# Patient Record
Sex: Female | Born: 1955 | Race: Black or African American | Hispanic: No | Smoking: Never smoker
Health system: Southern US, Community
[De-identification: ages and names within clinical notes are randomized; demographics above are authoritative.]

## PROBLEM LIST (undated history)

## (undated) HISTORY — PX: KNEE SURGERY: SHX244

---

## 2000-06-08 ENCOUNTER — Encounter: Payer: Self-pay | Admitting: Emergency Medicine

## 2000-06-08 ENCOUNTER — Emergency Department (HOSPITAL_COMMUNITY): Admission: EM | Admit: 2000-06-08 | Discharge: 2000-06-08 | Payer: Self-pay | Admitting: Emergency Medicine

## 2004-10-22 ENCOUNTER — Other Ambulatory Visit: Admission: RE | Admit: 2004-10-22 | Discharge: 2004-10-22 | Payer: Self-pay | Admitting: Obstetrics and Gynecology

## 2004-11-22 ENCOUNTER — Ambulatory Visit (HOSPITAL_COMMUNITY): Admission: RE | Admit: 2004-11-22 | Discharge: 2004-11-22 | Payer: Self-pay | Admitting: *Deleted

## 2006-02-19 ENCOUNTER — Ambulatory Visit (HOSPITAL_COMMUNITY): Admission: RE | Admit: 2006-02-19 | Discharge: 2006-02-19 | Payer: Self-pay | Admitting: Obstetrics & Gynecology

## 2006-11-03 ENCOUNTER — Emergency Department (HOSPITAL_COMMUNITY): Admission: EM | Admit: 2006-11-03 | Discharge: 2006-11-03 | Payer: Self-pay | Admitting: *Deleted

## 2007-02-27 ENCOUNTER — Ambulatory Visit (HOSPITAL_COMMUNITY): Admission: RE | Admit: 2007-02-27 | Discharge: 2007-02-27 | Payer: Self-pay | Admitting: Obstetrics & Gynecology

## 2008-04-13 IMAGING — CR DG HAND COMPLETE 3+V*R*
3 series · 3 of 3 positions shown · non-contrast
Comparison: None available.

CLINICAL DATA: Fell-right hand pain.  
 RIGHT HAND ? 3 VIEW:

[x hand pa right]
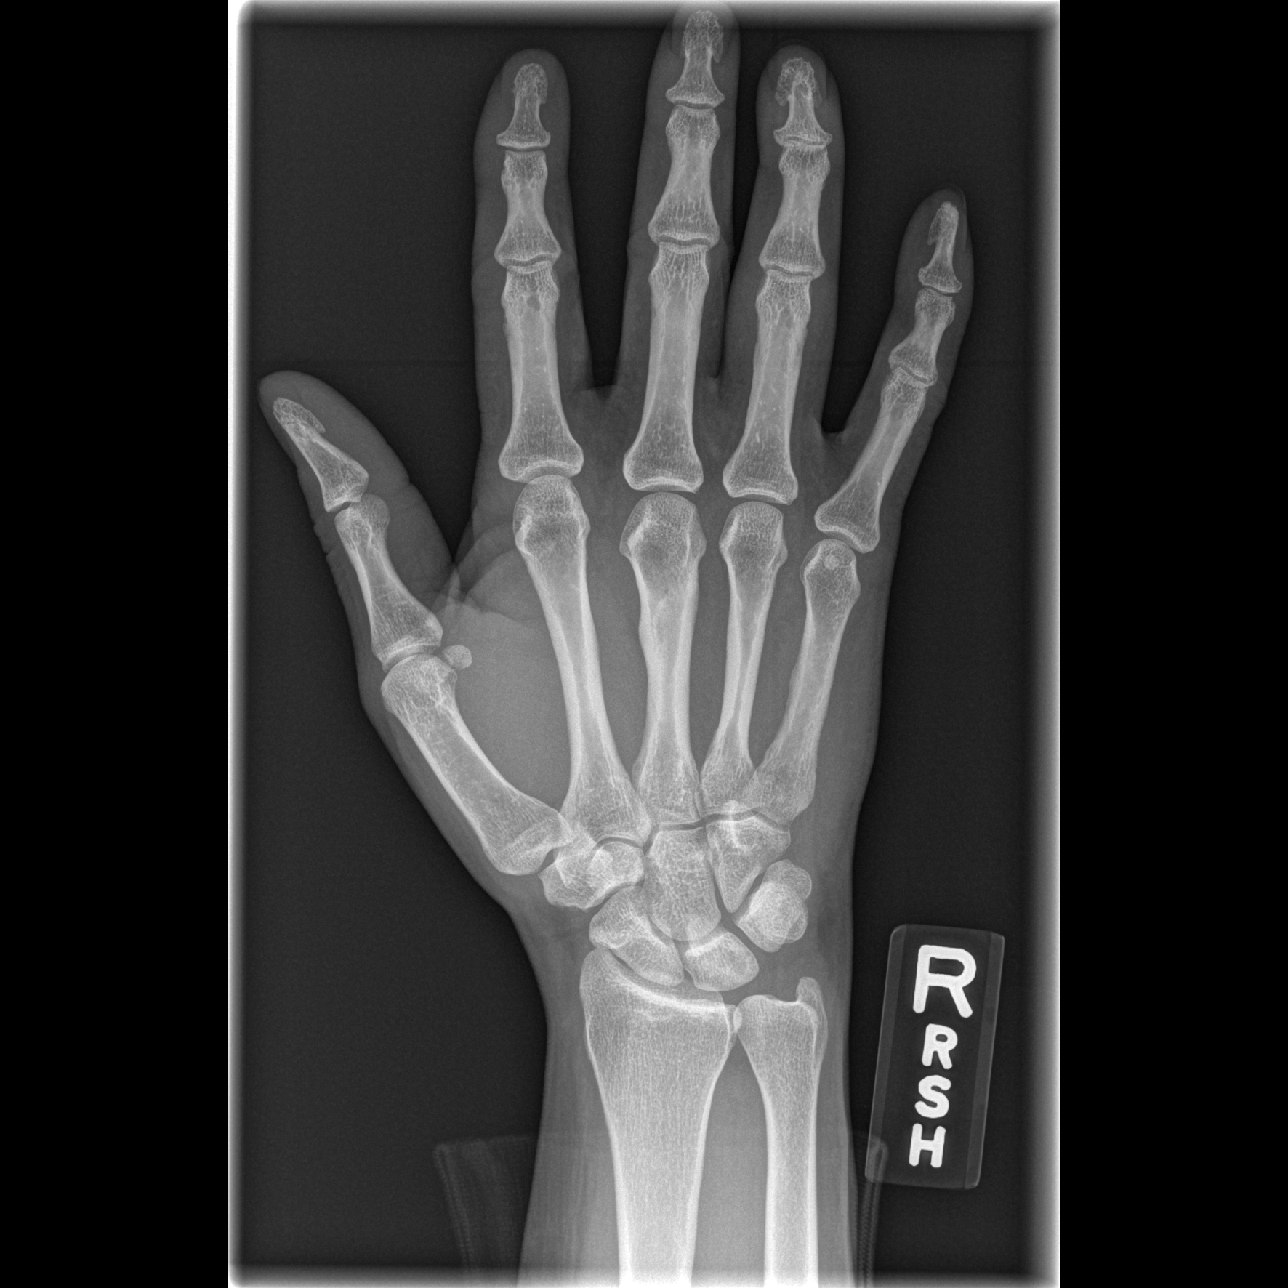

[x hand oblique right]
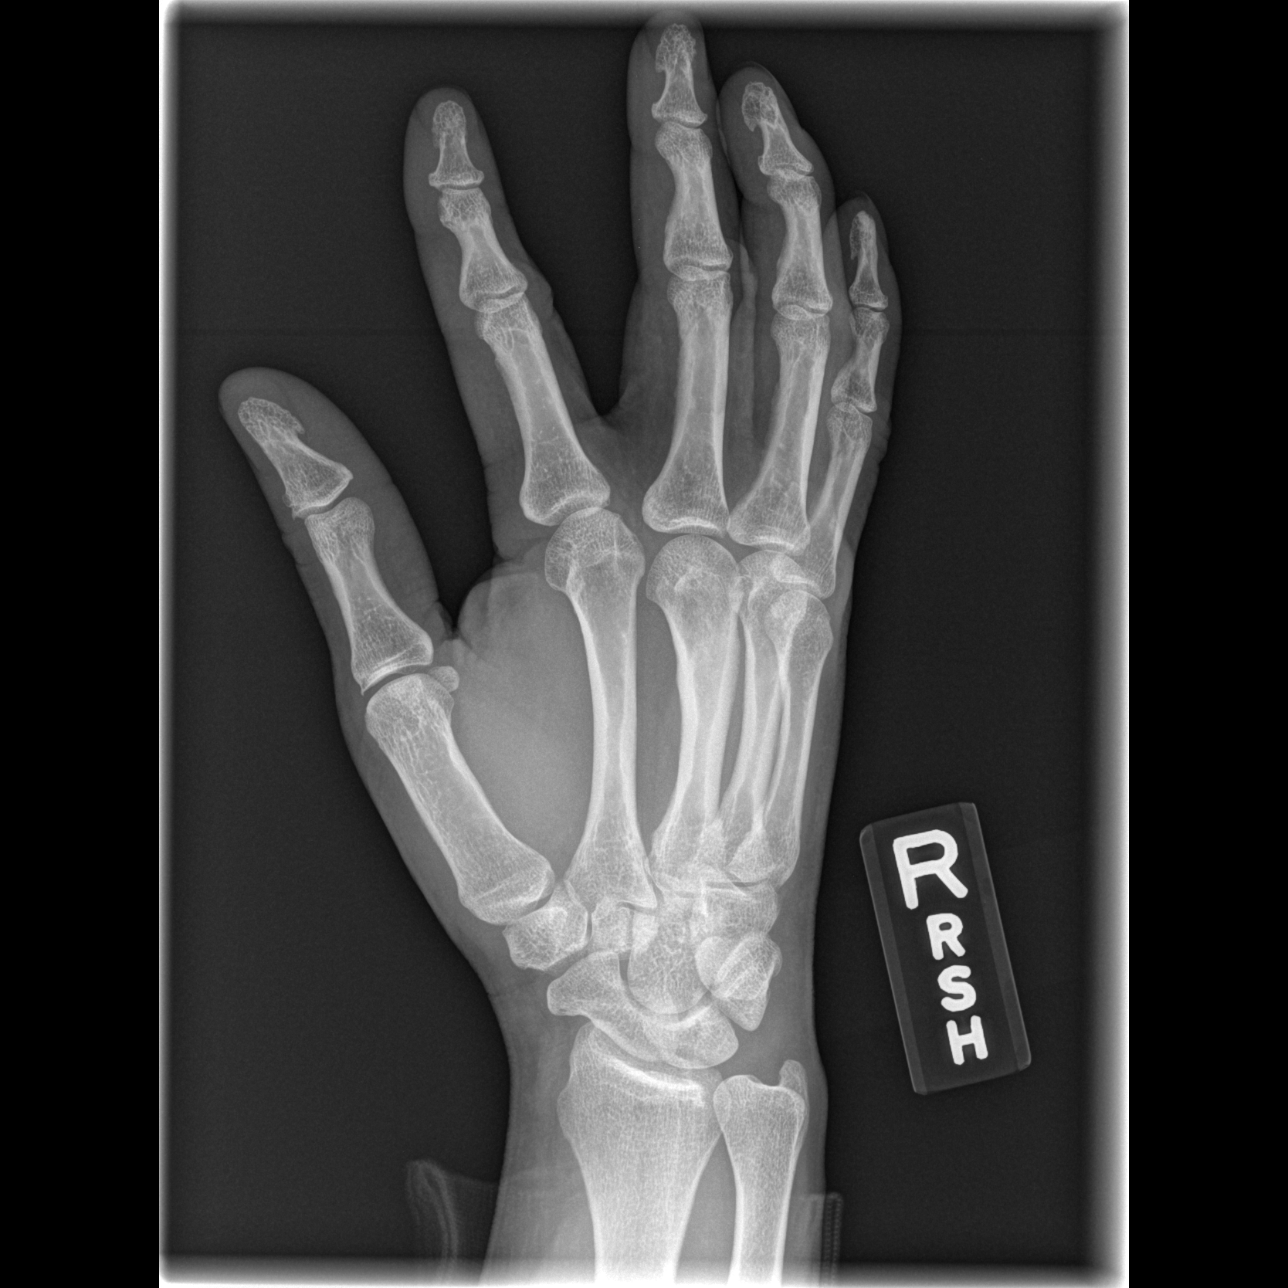

[x hand lat right]
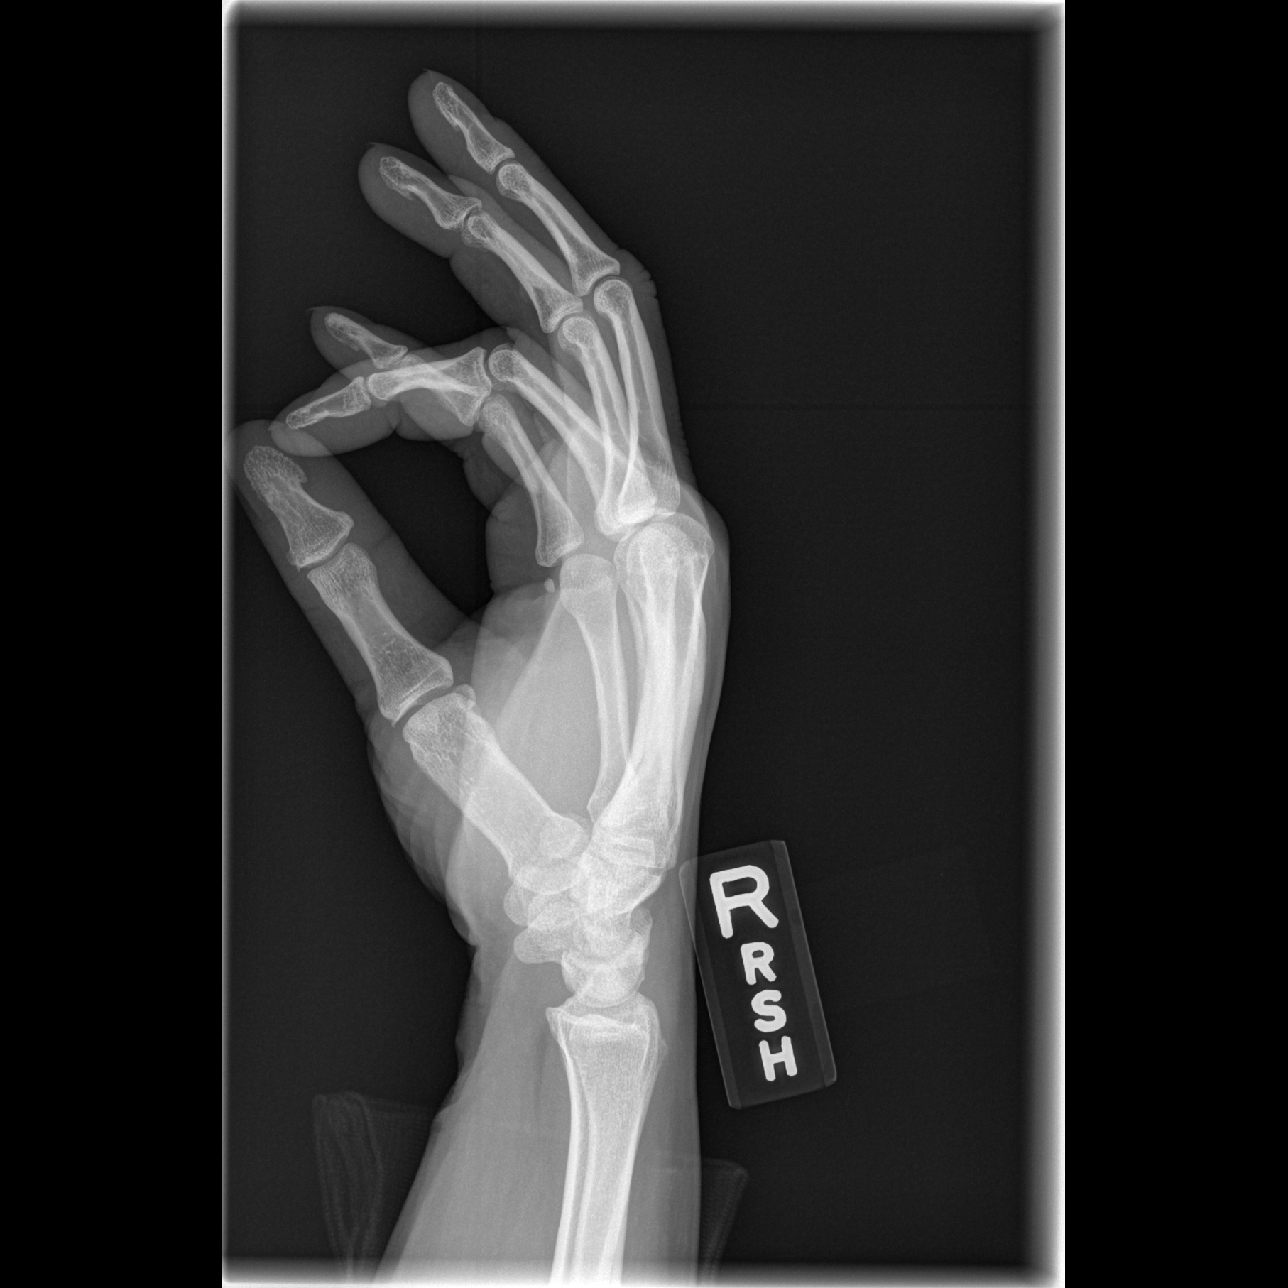

[3 of 3 positions shown; findings below may reference images not displayed]

FINDINGS: There is no evidence of fracture or dislocation.  There is no evidence of arthropathy or other focal bone abnormality.  Soft tissues are unremarkable.
IMPRESSION: Negative.

## 2010-04-27 ENCOUNTER — Other Ambulatory Visit: Admission: RE | Admit: 2010-04-27 | Discharge: 2010-04-27 | Payer: Self-pay | Admitting: Internal Medicine

## 2010-04-27 ENCOUNTER — Ambulatory Visit: Payer: Self-pay | Admitting: Internal Medicine

## 2010-04-27 LAB — CONVERTED CEMR LAB
Albumin: 3.7 g/dL (ref 3.5–5.2)
Basophils Relative: 0.8 % (ref 0.0–3.0)
CO2: 29 meq/L (ref 19–32)
Chloride: 104 meq/L (ref 96–112)
Creatinine, Ser: 0.7 mg/dL (ref 0.4–1.2)
Eosinophils Absolute: 0.2 10*3/uL (ref 0.0–0.7)
HCT: 35.4 % — ABNORMAL LOW (ref 36.0–46.0)
Hemoglobin: 12.3 g/dL (ref 12.0–15.0)
Lymphs Abs: 2.4 10*3/uL (ref 0.7–4.0)
MCHC: 34.9 g/dL (ref 30.0–36.0)
MCV: 85.3 fL (ref 78.0–100.0)
Monocytes Absolute: 0.5 10*3/uL (ref 0.1–1.0)
Neutro Abs: 2.5 10*3/uL (ref 1.4–7.7)
Pap Smear: NEGATIVE
RBC: 4.15 M/uL (ref 3.87–5.11)
Total CHOL/HDL Ratio: 4
Total Protein: 7.5 g/dL (ref 6.0–8.3)
Triglycerides: 66 mg/dL (ref 0.0–149.0)

## 2010-05-03 ENCOUNTER — Encounter: Payer: Self-pay | Admitting: Internal Medicine

## 2010-05-29 ENCOUNTER — Encounter: Payer: Self-pay | Admitting: Internal Medicine

## 2010-05-31 ENCOUNTER — Telehealth: Payer: Self-pay | Admitting: Internal Medicine

## 2010-07-03 ENCOUNTER — Encounter (INDEPENDENT_AMBULATORY_CARE_PROVIDER_SITE_OTHER): Payer: Self-pay | Admitting: *Deleted

## 2010-07-24 NOTE — Letter (Signed)
Summary: Lipid Letter  [facility]  [address] [place], [place] [address]   Phone: [phone]  Fax: [phone]    04/27/2010  Melanie Garrett [address] [place], [place]  [address]  Dear Melanie Garrett:  We have carefully reviewed your last lipid profile from 04/27/2010 and the results are noted below with a summary of recommendations for lipid management.    Cholesterol:       193     Goal: <200   HDL "good" Cholesterol:   87.56     Goal: >50   LDL "bad" Cholesterol:   132     Goal: <130   Triglycerides:       66.0     Goal: <150        TLC Diet (Therapeutic Lifestyle Change): Saturated Fats & Transfatty acids should be kept < 7% of total calories ***Reduce Saturated Fats Polyunstaurated Fat can be up to 10% of total calories Monounsaturated Fat Fat can be up to 20% of total calories Total Fat should be no greater than 25-35% of total calories Carbohydrates should be 50-60% of total calories Protein should be approximately 15% of total calories Fiber should be at least 20-30 grams a day ***Increased fiber may help lower LDL Total Cholesterol should be < 200mg /day Consider adding plant stanol/sterols to diet (example: Benacol spread) ***A higher intake of unsaturated fat may reduce Triglycerides and Increase HDL    Adjunctive Measures (may lower LIPIDS and reduce risk of Heart Attack) include: Aerobic Exercise (20-30 minutes 3-4 times a week) Limit Alcohol Consumption Weight Reduction Aspirin 75-81 mg a day by mouth (if not allergic or contraindicated) Dietary Fiber 20-30 grams a day by mouth     Current Medications:  None If you have any questions, please call. We appreciate being able to work with you.   Sincerely,    [facility] Etta Grandchild MD

## 2010-07-24 NOTE — Letter (Signed)
Summary: Results Follow-up Letter  [facility]  [address] [place], [place] [address]   Phone: [phone]  Fax: [phone]    04/27/2010  [address] [place], [place]  [address]  Dear Ms. Joseph Art,   The following are the results of your recent test(s):  Test     Result     CBC       mild anemia Liver/kidney   normal Thyroid     normal   _________________________________________________________  Please call for an appointment as directed _________________________________________________________ _________________________________________________________ _________________________________________________________  Sincerely,  Sanda Linger MD [facility]

## 2010-07-24 NOTE — Letter (Signed)
Summary: Results Follow-up Letter  [facility]  [address] [place], [place] [address]   Phone: [phone]  Fax: [phone]    05/03/2010  [address] [place], [place]  [address]  Dear Ms. Melanie Garrett,   The following are the results of your recent test(s):  Test     Result     Pap Smear    Normal___XX____  Not Normal_____       Comments: _________________________________________________________ Cholesterol LDL(Bad cholesterol):          Your goal is less than:         HDL (Good cholesterol):        Your goal is more than: _________________________________________________________ Other Tests:   _________________________________________________________  Please call for an appointment Or _________________________________________________________ _________________________________________________________ _________________________________________________________  Sincerely,  Sanda Linger MD [facility]

## 2010-07-24 NOTE — Assessment & Plan Note (Signed)
Summary: NEW / Melanie Garrett  #   Vital Signs:  Patient profile:   55 year old female Menstrual status:  postmenopausal Height:      62 inches Weight:      185.50 pounds BMI:     34.05 O2 Sat:      99 % on Room air Temp:     98.0 degrees F oral Pulse rate:   74 / minute Pulse rhythm:   regular Resp:     16 per minute BP sitting:   134 / 92  (left arm) Cuff size:   large  Vitals Entered By: Rock Nephew CMA (April 27, 2010 1:36 PM)  Nutrition Counseling: Patient's BMI is greater than 25 and therefore counseled on weight management options.  O2 Flow:  Room air CC: New to establish, Preventive Care Is Patient Diabetic? No Pain Assessment Patient in pain? no          Menstrual Status postmenopausal Last PAP Result normal   Primary Care Provider:  Etta Grandchild MD  CC:  New to establish and Preventive Care.  History of Present Illness: New to me she needs a complete physical, she feels well and offers no complaints.  Preventive Screening-Counseling & Management  Alcohol-Tobacco     Alcohol drinks/day: 0     Alcohol Counseling: not indicated; patient does not drink     Smoking Status: never     Tobacco Counseling: not indicated; no tobacco use  Caffeine-Diet-Exercise     Does Patient Exercise: no  Hep-HIV-STD-Contraception     Hepatitis Risk: no risk noted     HIV Risk: no risk noted     STD Risk: no risk noted     Dental Visit-last 6 months yes     Dental Care Counseling: not indicated; dental care within six months     SBE monthly: yes     SBE Education/Counseling: to perform regular SBE  Safety-Violence-Falls     Seat Belt Use: yes     Helmet Use: n/a     Firearms in the Home: no firearms in the home     Smoke Detectors: yes     Violence in the Home: no risk noted     Sexual Abuse: no      Sexual History:  currently monogamous.        Drug Use:  no.        Blood Transfusions:  no.    Medications Prior to Update: 1)  None  Current  Medications (verified): 1)  None  Allergies (verified): No Known Drug Allergies  Past History:  Past Medical History: Unremarkable  Past Surgical History: Denies surgical history  Family History: Family History of Arthritis  Social History: Occupation: CNA at a nursing home [employer] Never Smoked Alcohol use-no Drug use-no Regular exercise-no Smoking Status:  never Drug Use:  no Does Patient Exercise:  no Hepatitis Risk:  no risk noted HIV Risk:  no risk noted STD Risk:  no risk noted Dental Care w/in 6 mos.:  yes Seat Belt Use:  yes Sexual History:  currently monogamous Blood Transfusions:  no  Review of Systems       The patient complains of weight gain.  The patient denies anorexia, fever, weight loss, chest pain, syncope, dyspnea on exertion, peripheral edema, prolonged cough, headaches, hemoptysis, abdominal pain, melena, hematochezia, severe indigestion/heartburn, hematuria, muscle weakness, suspicious skin lesions, difficulty walking, depression, enlarged lymph nodes, angioedema, and breast masses.   GU:  Denies abnormal vaginal  bleeding, discharge, dysuria, incontinence, nocturia, urinary frequency, and urinary hesitancy.  Physical Exam  General:  alert, well-developed, well-nourished, well-hydrated, appropriate dress, normal appearance, healthy-appearing, cooperative to examination, and overweight-appearing.   Head:  normocephalic, atraumatic, no abnormalities observed, and no abnormalities palpated.   Eyes:  vision grossly intact, pupils equal, pupils round, and pupils reactive to light.   Ears:  R ear normal and L ear normal.   Mouth:  Oral mucosa and oropharynx without lesions or exudates.  Teeth in good repair. Neck:  supple, full ROM, no masses, no thyromegaly, no thyroid nodules or tenderness, no JVD, normal carotid upstroke, no carotid bruits, no cervical lymphadenopathy, and no neck tenderness.   Chest Wall:  No deformities, masses, or tenderness  noted. Breasts:  No mass, nodules, thickening, tenderness, bulging, retraction, inflamation, nipple discharge or skin changes noted.   Lungs:  normal respiratory effort, no intercostal retractions, no accessory muscle use, normal breath sounds, no dullness, no fremitus, no crackles, and no wheezes.   Heart:  normal rate, regular rhythm, no murmur, no gallop, no rub, and no JVD.   Abdomen:  soft, non-tender, normal bowel sounds, no distention, no masses, no guarding, no rigidity, no rebound tenderness, no abdominal hernia, no inguinal hernia, no hepatomegaly, and no splenomegaly.   Rectal:  No external abnormalities noted. Normal sphincter tone. No rectal masses or tenderness. Genitalia:  Normal introitus for age, no external lesions, no vaginal discharge, mucosa pink and moist, no vaginal or cervical lesions, no vaginal atrophy, no friaility or hemorrhage, normal uterus size and position, no adnexal masses or tenderness Msk:  normal ROM, no joint tenderness, no joint swelling, no joint warmth, no redness over joints, no joint deformities, no joint instability, no crepitation, and no muscle atrophy.   Pulses:  R and L carotid,radial,femoral,dorsalis pedis and posterior tibial pulses are full and equal bilaterally Extremities:  No clubbing, cyanosis, edema, or deformity noted with normal full range of motion of all joints.   Neurologic:  No cranial nerve deficits noted. Station and gait are normal. Plantar reflexes are down-going bilaterally. DTRs are symmetrical throughout. Sensory, motor and coordinative functions appear intact. Skin:  turgor normal, color normal, no rashes, no suspicious lesions, no ecchymoses, no petechiae, no purpura, no ulcerations, no edema, and tattoo(s).   Cervical Nodes:  no anterior cervical adenopathy and no posterior cervical adenopathy.   Axillary Nodes:  no R axillary adenopathy and no L axillary adenopathy.   Inguinal Nodes:  no R inguinal adenopathy and no L inguinal  adenopathy.   Psych:  Cognition and judgment appear intact. Alert and cooperative with normal attention span and concentration. No apparent delusions, illusions, hallucinations   Impression & Recommendations:  Problem # 1:  ROUTINE GENERAL MEDICAL EXAM@HEALTH  CARE FACL (ICD-V70.0) Assessment New  Orders: Venipuncture ([address]) TLB-Lipid Panel (80061-LIPID) TLB-BMP (Basic Metabolic Panel-BMET) (80048-METABOL) TLB-CBC Platelet - w/Differential (85025-CBCD) TLB-Hepatic/Liver Function Pnl (80076-HEPATIC) TLB-TSH (Thyroid Stimulating Hormone) (98119-JYN) Radiology Referral (Radiology) Gastroenterology Referral (GI)  Mammogram: normal (06/25/2007) Pap smear: normal (06/25/2007)  Discussed using sunscreen, use of alcohol, drug use, self breast exam, routine dental care, routine eye care, schedule for GYN exam, routine physical exam, seat belts, multiple vitamins, osteoporosis prevention, adequate calcium intake in diet, recommendations for immunizations, mammograms and Pap smears.  Discussed exercise and checking cholesterol.   Mammogram: normal (06/25/2007) Pap smear: normal (06/25/2007) Td Booster: Tdap (04/27/2010)     Other Orders: Specimen Handling (82956) Tdap => 24yrs IM (21308) Admin 1st Vaccine (65784)  Colorectal Screening:  Current  Recommendations:    Hemoccult: NEG X 1 today  PAP Screening:    Last PAP smear:  06/25/2007    Reviewed PAP smear recommendations:  PAP smear done  Mammogram Screening:    Last Mammogram:  06/25/2007    Reviewed Mammogram recommendations:  mammogram ordered  Osteoporosis Risk Assessment:  Risk Factors for Fracture or Low Bone Density:   Smoking status:       never  Immunization & Chemoprophylaxis:    Tetanus vaccine: Tdap  (04/27/2010)  Patient Instructions: 1)  It is important that you exercise regularly at least 20 minutes 5 times a week. If you develop chest pain, have severe difficulty breathing, or feel very tired , stop  exercising immediately and seek medical attention. 2)  You need to lose weight. Consider a lower calorie diet and regular exercise.  3)  Schedule your mammogram. 4)  Schedule a colonoscopy/sigmoidoscopy to help detect colon cancer. 5)  You need to have a Pap Smear to prevent cervical cancer. 6)  Please schedule a follow-up appointment in 2 months.   Orders Added: 1)  Venipuncture [36415] 2)  TLB-Lipid Panel [80061-LIPID] 3)  TLB-BMP (Basic Metabolic Panel-BMET) [80048-METABOL] 4)  TLB-CBC Platelet - w/Differential [85025-CBCD] 5)  TLB-Hepatic/Liver Function Pnl [80076-HEPATIC] 6)  TLB-TSH (Thyroid Stimulating Hormone) [84443-TSH] 7)  Specimen Handling [99000] 8)  Tdap => 11yrs IM [90715] 9)  Admin 1st Vaccine [[address]] 10)  Radiology Referral [Radiology] 11)  Gastroenterology Referral [GI] 12)  New Patient 40-64 years [99[address]]   Immunizations Administered:  Tetanus Vaccine:    Vaccine Type: Tdap    Site: right deltoid    Mfr: GlaxoSmithKline    Dose: 0.5 ml    Route: IM    Given by: Rock Nephew CMA    Exp. Date: 04/12/2012    Lot #: [id number]    VIS given: 05/12/07 version given April 27, 2010.   Immunizations Administered:  Tetanus Vaccine:    Vaccine Type: Tdap    Site: right deltoid    Mfr: GlaxoSmithKline    Dose: 0.5 ml    Route: IM    Given by: Rock Nephew CMA    Exp. Date: 04/12/2012    Lot #: [id number]    VIS given: 05/12/07 version given April 27, 2010.  Preventive Care Screening  Mammogram:    Date:  06/25/2007    Results:  normal   Pap Smear:    Date:  06/25/2007    Results:  normal       Prevention & Chronic Care Immunizations   Influenza vaccine: Not documented   Influenza vaccine deferral: Refused  (04/27/2010)    Tetanus booster: 04/27/2010: Tdap    Pneumococcal vaccine: Not documented  Colorectal Screening   Hemoccult: Not documented   Hemoccult action/deferral: NEG X 1 today  (04/27/2010)    Colonoscopy: Not  documented  Other Screening   Pap smear: normal  (06/25/2007)   Pap smear action/deferral: PAP smear done  (04/27/2010)    Mammogram: normal  (06/25/2007)   Mammogram action/deferral: mammogram ordered  (04/27/2010)   Smoking status: never  (04/27/2010)  Lipids   Total Cholesterol: Not documented   LDL: Not documented   LDL Direct: Not documented   HDL: Not documented   Triglycerides: Not documented

## 2010-07-24 NOTE — Progress Notes (Signed)
    PAP Screening:    Last PAP smear:  04/27/2010  Mammogram Screening:    Last Mammogram:  05/29/2010  Mammogram Results:    Date of Exam:  05/29/2010    Results:  Normal Bilateral  Osteoporosis Risk Assessment:  Risk Factors for Fracture or Low Bone Density:   Smoking status:       never  Immunization & Chemoprophylaxis:    Tetanus vaccine: Tdap  (04/27/2010)

## 2010-07-26 ENCOUNTER — Encounter (INDEPENDENT_AMBULATORY_CARE_PROVIDER_SITE_OTHER): Payer: Self-pay | Admitting: *Deleted

## 2010-07-26 NOTE — Letter (Signed)
Summary: Pre Visit Letter Revised  [facility]  [address] [place], [place] [address]   Phone: [phone]  Fax: [phone]        07/03/2010 MRN: [id number] [facility] [address] [place], [place]  [address]             Procedure Date:  08-10-10   Welcome to the Gastroenterology Division at [facility].    You are scheduled to see a nurse for your pre-procedure visit on 07-27-10 at 4:00p.m. on the 3rd floor at [facility], [address]  We ask that you try to arrive at our office 15 minutes prior to your appointment time to allow for check-in.  Please take a minute to review the attached form.  If you answer "Yes" to one or more of the questions on the first page, we ask that you call the person listed at your earliest opportunity.  If you answer "No" to all of the questions, please complete the rest of the form and bring it to your appointment.    Your nurse visit will consist of discussing your medical and surgical history, your immediate family medical history, and your medications.   If you are unable to list all of your medications on the form, please bring the medication bottles to your appointment and we will list them.  We will need to be aware of both prescribed and over the counter drugs.  We will need to know exact dosage information as well.    Please be prepared to read and sign documents such as consent forms, a financial agreement, and acknowledgement forms.  If necessary, and with your consent, a friend or relative is welcome to sit-in on the nurse visit with you.  Please bring your insurance card so that we may make a copy of it.  If your insurance requires a referral to see a specialist, please bring your referral form from your primary care physician.  No co-pay is required for this nurse visit.     If you cannot keep your appointment, please call [phone] to cancel or reschedule prior to your appointment date.  This allows  [place] the opportunity to schedule an appointment for another patient in need of care.    Thank you for choosing [facility] for your medical needs.  We appreciate the opportunity to care for you.  Please visit [place] at our website  to learn more about our practice.  Sincerely, The Gastroenterology Division

## 2010-07-27 ENCOUNTER — Ambulatory Visit: Admit: 2010-07-27 | Payer: Self-pay | Admitting: Gastroenterology

## 2010-07-30 ENCOUNTER — Encounter: Payer: Self-pay | Admitting: Gastroenterology

## 2010-08-09 NOTE — Letter (Signed)
Summary: Moviprep Instructions  [facility]  [address]   [place], [place] [address]   Phone: [phone]  Fax: [phone]       SIMI BRIEL    Aug 23, 1948    MRN: [id number]        Procedure Day Dorna Bloom: Friday, 08-10-10     Arrival Time: 8:30 a.m.     Procedure Time: 9:30 a.m.     Location of Procedure:                    x  [facility] (4th Floor)                        PREPARATION FOR COLONOSCOPY WITH MOVIPREP   Starting 5 days prior to your procedure 08-05-10 do not eat nuts, seeds, popcorn, corn, beans, peas,  salads, or any raw vegetables.  Do not take any fiber supplements (e.g. Metamucil, Citrucel, and Benefiber).  THE DAY BEFORE YOUR PROCEDURE         DATE:  08-09-10  DAY: Thursday  1.  Drink clear liquids the entire day-NO SOLID FOOD  2.  Do not drink anything colored red or purple.  Avoid juices with pulp.  No orange juice.  3.  Drink at least 64 oz. (8 glasses) of fluid/clear liquids during the day to prevent dehydration and help the prep work efficiently.  CLEAR LIQUIDS INCLUDE: Water Jello Ice Popsicles Tea (sugar ok, no milk/cream) Powdered fruit flavored drinks Coffee (sugar ok, no milk/cream) Gatorade Juice: apple, white grape, white cranberry  Lemonade Clear bullion, consomm, broth Carbonated beverages (any kind) Strained chicken noodle soup Hard Candy                             4.  In the morning, mix first dose of MoviPrep solution:    Empty 1 Pouch A and 1 Pouch B into the disposable container    Add lukewarm drinking water to the top line of the container. Mix to dissolve    Refrigerate (mixed solution should be used within 24 hrs)  5.  Begin drinking the prep at 5:00 p.m. The MoviPrep container is divided by 4 marks.   Every 15 minutes drink the solution down to the next mark (approximately 8 oz) until the full liter is complete.   6.  Follow completed prep with 16 oz of clear liquid of your choice  (Nothing red or purple).  Continue to drink clear liquids until bedtime.  7.  Before going to bed, mix second dose of MoviPrep solution:    Empty 1 Pouch A and 1 Pouch B into the disposable container    Add lukewarm drinking water to the top line of the container. Mix to dissolve    Refrigerate  THE DAY OF YOUR PROCEDURE      DATE: 08-10-10  DAY: Friday  Beginning at 4:30 a.m. (5 hours before procedure):         1. Every 15 minutes, drink the solution down to the next mark (approx 8 oz) until the full liter is complete.  2. Follow completed prep with 16 oz. of clear liquid of your choice.    3. You may drink clear liquids until 7:30 a.m. (2 HOURS BEFORE PROCEDURE).   MEDICATION INSTRUCTIONS  Unless otherwise instructed, you should take regular prescription medications with a small sip of water   as early as possible the morning  of your procedure.        OTHER INSTRUCTIONS  You will need a responsible adult at least 55 years of age to accompany you and drive you home.   This person must remain in the waiting room during your procedure.  Wear loose fitting clothing that is easily removed.  Leave jewelry and other valuables at home.  However, you may wish to bring a book to read or  an iPod/MP3 player to listen to music as you wait for your procedure to start.  Remove all body piercing jewelry and leave at home.  Total time from sign-in until discharge is approximately 2-3 hours.  You should go home directly after your procedure and rest.  You can resume normal activities the  day after your procedure.  The day of your procedure you should not:   Drive   Make legal decisions   Operate machinery   Drink alcohol   Return to work  You will receive specific instructions about eating, activities and medications before you leave.    The above instructions have been reviewed and explained to me by   Ezra Sites RN  July 30, 2010 2:10 PM    I fully  understand and can verbalize these instructions _____________________________ Date _________

## 2010-08-09 NOTE — Miscellaneous (Signed)
Summary: [facility]  Clinical Lists Changes  Medications: Added new medication of MOVIPREP 100 GM  SOLR (PEG-KCL-NACL-NASULF-NA ASC-C) As per prep instructions. - Signed Rx of MOVIPREP 100 GM  SOLR (PEG-KCL-NACL-NASULF-NA ASC-C) As per prep instructions.;  #1 x 0;  Signed;  Entered by: Ezra Sites RN;  Authorized by: Meryl Dare MD [facility];  Method used: Electronically to [facility]  [address] [id number] [address], [place], [place]  [address], Ph: [phone] or [phone], Fax: [phone] Observations: Added new observation of NKA: T (07/30/2010 13:26)    Prescriptions: MOVIPREP 100 GM  SOLR (PEG-KCL-NACL-NASULF-NA ASC-C) As per prep instructions.  #1 x 0   Entered by:   Ezra Sites RN   Authorized by:   Meryl Dare MD [facility]   Signed by:   Ezra Sites RN on 07/30/2010   Method used:   [facility]ronically to        [facility]  [address] [id number] ([facility]ail)       [address]       [place], [place]  [address]       Ph: [phone] or [phone]       Fax: [phone]   RxID:   [id number]

## 2010-08-10 ENCOUNTER — Other Ambulatory Visit (AMBULATORY_SURGERY_CENTER): Payer: BC Managed Care – PPO | Admitting: Gastroenterology

## 2010-08-10 ENCOUNTER — Other Ambulatory Visit: Payer: Self-pay | Admitting: Gastroenterology

## 2010-08-10 DIAGNOSIS — D126 Benign neoplasm of colon, unspecified: Secondary | ICD-10-CM

## 2010-08-10 DIAGNOSIS — Z1211 Encounter for screening for malignant neoplasm of colon: Secondary | ICD-10-CM

## 2010-08-14 ENCOUNTER — Encounter: Payer: Self-pay | Admitting: Gastroenterology

## 2010-08-15 NOTE — Procedures (Addendum)
Summary: Colonoscopy  Patient: Melanie Garrett Note: All result statuses are Final unless otherwise noted.  Tests: (1) Colonoscopy (COL)   COL Colonoscopy           DONE     [facility]     [address]     [place], [place]  [address]           COLONOSCOPY PROCEDURE REPORT     PATIENT:  [name], [name]  MR#:  [id number]     BIRTHDATE:  December 11, 1955, 54 yrs. old  GENDER:  female     ENDOSCOPIST:  Judie Petit T. Russella Dar, MD, [facility]     Referred by:  Etta Grandchild, M.D.     PROCEDURE DATE:  08/10/2010     PROCEDURE:  Colonoscopy with snare polypectomy     ASA CLASS:  Class I     INDICATIONS:  1) Routine Risk Screening     MEDICATIONS:   Fentanyl 75 mcg IV, Versed 7 mg IV     DESCRIPTION OF PROCEDURE:   After the risks benefits and     alternatives of the procedure were thoroughly explained, informed     consent was obtained.  Digital rectal exam was performed and     revealed no abnormalities.   The LB [id number]-[id number] endoscope     was introduced through the anus and advanced to the cecum, which     was identified by both the appendix and ileocecal valve, without     limitations.  The quality of the prep was excellent, using     MoviPrep.  The instrument was then slowly withdrawn as the colon     was fully examined.     <<PROCEDUREIMAGES>>     FINDINGS:  A sessile polyp was found in the distal transverse     colon. It was 5 mm in size. Polyp was snared without cautery.     Retrieval was successful. A normal appearing cecum, ileocecal     valve, and appendiceal orifice were identified. The ascending,     hepatic flexure, splenic flexure, descending, sigmoid colon, and     rectum appeared unremarkable. Retroflexed views in the rectum     revealed no abnormalities.  The time to cecum =  2  minutes. The     scope was then withdrawn (time =  10.75  min) from the patient and     the procedure completed.     COMPLICATIONS:  None           ENDOSCOPIC IMPRESSION:     1) 5 mm sessile  polyp in the distal transverse colon           RECOMMENDATIONS:     1) Await pathology results     2) If the polyp is adenomatous (pre-cancerous), colonoscopy in 5     years. Otherwise follow colorectal cancer screening guidelines for     "routine risk" patients with colonoscopy in 10 years.           [name]. Russella Dar, MD, [facility]           n.     eSIGNED:   [name]. Airiana Elman at 08/10/2010 10:18 AM           [name], [id number]  Note: An exclamation mark (!) indicates a result that was not dispersed into the flowsheet. Document Creation Date: 08/10/2010 10:19 AM _______________________________________________________________________  (1) Order result status: Final Collection or observation date-time: 08/10/2010 10:15 Requested date-time:  Receipt  date-time:  Reported date-time:  Referring Physician:   Ordering Physician: Claudette Head [phone]) Specimen Source:  Source: Launa Grill Order Number: [id number] Lab site:   Appended Document: Colonoscopy     Procedures Next Due Date:    Colonoscopy: 07/2015

## 2010-08-21 NOTE — Letter (Signed)
Summary: Patient Notice- Polyp Results  [facility]  [address] [place], [place] [address]   Phone: [phone]  Fax: [phone]        August 14, 2010 MRN: [id number]    [facility] [address] [place], [place]  [address]    Dear Ms. Melanie Garrett,  I am pleased to inform you that the colon polyp(s) removed during your recent colonoscopy was (were) found to be benign (no cancer detected) upon pathologic examination.  I recommend you have a repeat colonoscopy examination in 5 years to look for recurrent polyps, as having colon polyps increases your risk for having recurrent polyps or even colon cancer in the future.  Should you develop new or worsening symptoms of abdominal pain, bowel habit changes or bleeding from the rectum or bowels, please schedule an evaluation with either your primary care physician or with me.  Continue treatment plan as outlined the day of your exam.  Please call us if you are having persistent problems or have questions about your condition that have not been fully answered at this time.  Sincerely,  Meryl Dare MD [facility]  This letter has been electronically signed by your physician.  Appended Document: Patient Notice- Polyp Results letter mailed

## 2011-10-25 LAB — HM MAMMOGRAPHY: HM Mammogram: NORMAL

## 2011-11-11 ENCOUNTER — Encounter: Payer: Self-pay | Admitting: Internal Medicine

## 2011-11-11 ENCOUNTER — Ambulatory Visit (INDEPENDENT_AMBULATORY_CARE_PROVIDER_SITE_OTHER)
Admission: RE | Admit: 2011-11-11 | Discharge: 2011-11-11 | Disposition: A | Discharge status: Home / Self Care | Payer: BC Managed Care – PPO | Source: Ambulatory Visit | Attending: Internal Medicine | Admitting: Internal Medicine

## 2011-11-11 ENCOUNTER — Ambulatory Visit (INDEPENDENT_AMBULATORY_CARE_PROVIDER_SITE_OTHER): Payer: BC Managed Care – PPO | Admitting: Internal Medicine

## 2011-11-11 VITALS — BP 130/78 | HR 68 | Temp 97.3°F | Resp 16 | Wt 187.0 lb

## 2011-11-11 DIAGNOSIS — M25552 Pain in left hip: Secondary | ICD-10-CM | POA: Insufficient documentation

## 2011-11-11 DIAGNOSIS — M25559 Pain in unspecified hip: Secondary | ICD-10-CM

## 2011-11-11 DIAGNOSIS — M25551 Pain in right hip: Secondary | ICD-10-CM

## 2011-11-11 MED ORDER — NAPROXEN-ESOMEPRAZOLE 500-20 MG PO TBEC: 1.0000 | DELAYED_RELEASE_TABLET | Freq: Two times a day (BID) | ORAL | Status: DC

## 2011-11-11 NOTE — Assessment & Plan Note (Signed)
I am concerned that she may have djd or avn in her hips so I have asked her to get plain xrays done, also think she may have overuse syndrome. She will try some nsaids for symptom relief.

## 2011-11-11 NOTE — Progress Notes (Signed)
Subjective:    Patient ID: Melanie Garrett, female    DOB: [birth year], 56 y.o.   MRN: [id number]  HPI She returns c/o bilateral hip pain for several months. The pain starts over her GT bilaterally and radiates down into her thighs. She associates the pain with standing and walkng on concrete floors while at work. She does not recall any specific trauma or injury. The pain does not bother her while she is sitting, resting, or sleeping. She takes tylenol as needed for pain with some relief. None of her other joints bother her.   Review of Systems  Constitutional: Negative for fever, chills, diaphoresis, activity change, appetite change, fatigue and unexpected weight change.  HENT: Negative.   Eyes: Negative.   Respiratory: Negative for cough, chest tightness, shortness of breath, wheezing and stridor.   Cardiovascular: Negative for chest pain, palpitations and leg swelling.  Gastrointestinal: Negative for nausea, vomiting, abdominal pain, diarrhea, constipation and anal bleeding.  Genitourinary: Negative.   Musculoskeletal: Positive for arthralgias. Negative for myalgias, back pain, joint swelling and gait problem.  Skin: Negative for color change, pallor, rash and wound.  Neurological: Negative.   Hematological: Negative for adenopathy. Does not bruise/bleed easily.  Psychiatric/Behavioral: Negative.        Objective:   Physical Exam  Vitals reviewed. Constitutional: She is oriented to person, place, and time. She appears well-developed and well-nourished. No distress.  HENT:  Head: Normocephalic and atraumatic.  Mouth/Throat: Oropharynx is clear and moist. No oropharyngeal exudate.  Eyes: Conjunctivae are normal. Right eye exhibits no discharge. Left eye exhibits no discharge. No scleral icterus.  Neck: Normal range of motion. Neck supple. No JVD present. No tracheal deviation present. No thyromegaly present.  Cardiovascular: Normal rate, regular rhythm, normal heart sounds and intact  distal pulses.  Exam reveals no gallop and no friction rub.   No murmur heard. Pulmonary/Chest: Effort normal and breath sounds normal. No stridor. No respiratory distress. She has no wheezes. She has no rales. She exhibits no tenderness.  Abdominal: Soft. Bowel sounds are normal. She exhibits no distension and no mass. There is no tenderness. There is no rebound and no guarding.  Musculoskeletal: Normal range of motion. She exhibits no edema and no tenderness.       Right hip: Normal. She exhibits normal range of motion, normal strength, no tenderness, no bony tenderness, no swelling, no crepitus, no deformity and no laceration.       Left hip: Normal. She exhibits normal range of motion, normal strength, no tenderness, no bony tenderness, no swelling, no crepitus and no deformity.       Lumbar back: Normal. She exhibits normal range of motion, no tenderness, no bony tenderness, no swelling, no edema, no deformity, no laceration, no pain and no spasm.  Lymphadenopathy:    She has no cervical adenopathy.  Neurological: She is alert and oriented to person, place, and time. She has normal strength. She is not disoriented. She displays no atrophy, no tremor and normal reflexes. No cranial nerve deficit or sensory deficit. She exhibits normal muscle tone. She displays a negative Romberg sign. She displays no seizure activity. Coordination and gait normal.  Reflex Scores:      Tricep reflexes are 1+ on the right side and 1+ on the left side.      Bicep reflexes are 1+ on the right side and 1+ on the left side.      Brachioradialis reflexes are 1+ on the right side and 1+ on the left  side.      Patellar reflexes are 1+ on the right side and 1+ on the left side.      Achilles reflexes are 1+ on the right side and 1+ on the left side. Skin: Skin is warm and dry. No rash noted. She is not diaphoretic. No erythema. No pallor.  Psychiatric: She has a normal mood and affect. Her behavior is normal. Judgment  and thought content normal.          Assessment & Plan:

## 2011-11-11 NOTE — Patient Instructions (Signed)
Degenerative Arthritis  You have osteoarthritis. This is the wear and tear arthritis that comes with aging. It is also called degenerative arthritis. This is common in people past middle age. It is caused by stress on the joints. The large weight bearing joints of the lower extremities are most often affected. The knees, hips, back, neck, and hands can become painful, swollen, and stiff. This is the most common type of arthritis. It comes on with age, carrying too much weight, or from an injury.  Treatment includes resting the sore joint until the pain and swelling improve. Crutches or a walker may be needed for severe flares. Only take over-the-counter or prescription medicines for pain, discomfort, or fever as directed by your caregiver. Local heat therapy may improve motion. Cortisone shots into the joint are sometimes used to reduce pain and swelling during flares.  Osteoarthritis is usually not crippling and progresses slowly. There are things you can do to decrease pain:  · Avoid high impact activities.  · Exercise regularly.  · Low impact exercises such as walking, biking and swimming help to keep the muscles strong and keep normal joint function.  · Stretching helps to keep your range of motion.  · Lose weight if you are overweight. This reduces joint stress.  In severe cases when you have pain at rest or increasing disability, joint surgery may be helpful. See your caregiver for follow-up treatment as recommended.   SEEK IMMEDIATE MEDICAL CARE IF:   · You have severe joint pain.  · Marked swelling and redness in your joint develops.  · You develop a high fever.  Document Released: 06/10/2005 Document Revised: 05/30/2011 Document Reviewed: 11/10/2006  ExitCare® Patient Information ©2012 [employer].

## 2012-06-15 ENCOUNTER — Ambulatory Visit: Payer: BC Managed Care – PPO | Admitting: Internal Medicine

## 2012-11-17 ENCOUNTER — Encounter: Payer: Self-pay | Admitting: Internal Medicine

## 2012-12-07 ENCOUNTER — Encounter: Payer: Self-pay | Admitting: Internal Medicine

## 2012-12-07 ENCOUNTER — Ambulatory Visit (INDEPENDENT_AMBULATORY_CARE_PROVIDER_SITE_OTHER)
Admission: RE | Admit: 2012-12-07 | Discharge: 2012-12-07 | Disposition: A | Discharge status: Home / Self Care | Payer: BC Managed Care – PPO | Source: Ambulatory Visit | Attending: Internal Medicine | Admitting: Internal Medicine

## 2012-12-07 ENCOUNTER — Ambulatory Visit (INDEPENDENT_AMBULATORY_CARE_PROVIDER_SITE_OTHER): Payer: BC Managed Care – PPO | Admitting: Internal Medicine

## 2012-12-07 VITALS — BP 112/68 | HR 83 | Temp 98.1°F | Ht 62.0 in | Wt 190.0 lb

## 2012-12-07 DIAGNOSIS — M25511 Pain in right shoulder: Secondary | ICD-10-CM

## 2012-12-07 DIAGNOSIS — M25519 Pain in unspecified shoulder: Secondary | ICD-10-CM

## 2012-12-07 MED ORDER — NAPROXEN 250 MG PO TABS: 250.0000 mg | ORAL_TABLET | Freq: Two times a day (BID) | ORAL | Status: DC

## 2012-12-07 NOTE — Progress Notes (Signed)
Subjective:    Patient ID: Melanie Garrett, female    DOB: 1956/03/21, 57 y.o.   MRN: [id number]  HPI  Pt presents to the clinic today with c/o right arm pain x 2 weeks. She describes the pain as achy soreness. It intermittently comes and goes. She has tried Tylenol Arthritis, which has not helped. It seems to hurt worse at night. She reports that the pain seems is similar to her hip pain which from what she understands is arthritis. She denies any specific injury to the arm. She denies numbness or tingling in the arm/hand.  Review of Systems      History reviewed. No pertinent past medical history.  No current outpatient prescriptions on file.   No current facility-administered medications for this visit.    No Known Allergies  Family History  Problem Relation Age of Onset  . Arthritis Mother   . Alcohol abuse Neg Hx   . Cancer Neg Hx   . Diabetes Neg Hx   . Drug abuse Neg Hx   . Early death Neg Hx   . Heart disease Neg Hx   . Stroke Neg Hx     History   Social History  . Marital Status: Married    Spouse Name: N/A    Number of Children: N/A  . Years of Education: N/A   Occupational History  . Not on file.   Social History Main Topics  . Smoking status: Never Smoker   . Smokeless tobacco: Never Used  . Alcohol Use: No  . Drug Use: No  . Sexually Active: Yes    Birth Control/ Protection: Post-menopausal   Other Topics Concern  . Not on file   Social History Narrative  . No narrative on file     Constitutional: Denies fever, malaise, fatigue, headache or abrupt weight changes.  Musculoskeletal: Pt reports right arm pain. Denies decrease in range of motion, difficulty with gait, or joint swelling.  Skin: Denies redness, rashes, lesions or ulcercations.  Neurological: Denies dizziness, difficulty with memory, difficulty with speech or problems with balance and coordination.   No other specific complaints in a complete review of systems (except as listed in  HPI above).   Objective:   Physical Exam   BP 112/68  Pulse 83  Temp(Src) 98.1 F (36.7 C) (Oral)  Ht 5\' 2"  (1.575 m)  Wt 190 lb (86.183 kg)  BMI 34.74 kg/m2  SpO2 98% Wt Readings from Last 3 Encounters:  12/07/12 190 lb (86.183 kg)  11/11/11 187 lb (84.823 kg)  04/27/10 185 lb 8 oz (84.142 kg)    General: Appears her stated age, obese well developed, well nourished in NAD. Skin: Warm, dry and intact. No rashes, lesions or ulcerations noted.  Cardiovascular: Normal rate and rhythm. S1,S2 noted.  No murmur, rubs or gallops noted. No JVD or BLE edema. No carotid bruits noted. Pulmonary/Chest: Normal effort and positive vesicular breath sounds. No respiratory distress. No wheezes, rales or ronchi noted.  Musculoskeletal: Normal range of motion. No signs of joint swelling. No difficulty with gait. Negative drop arm test. Strength 5/5 BUE.  Neurological: Alert and oriented. Cranial nerves II-XII intact. Coordination normal. +DTRs bilaterally.   BMET    Component Value Date/Time   NA 139 04/27/2010 1340   K 4.2 04/27/2010 1340   CL 104 04/27/2010 1340   CO2 29 04/27/2010 1340   GLUCOSE 87 04/27/2010 1340   BUN 11 04/27/2010 1340   CREATININE 0.7 04/27/2010 1340   CALCIUM 9.4  04/27/2010 1340   GFRNONAA 120.08 04/27/2010 1340    Lipid Panel     Component Value Date/Time   CHOL 193 04/27/2010 1340   TRIG 66.0 04/27/2010 1340   HDL 47.60 04/27/2010 1340   CHOLHDL 4 04/27/2010 1340   VLDL 13.2 04/27/2010 1340   LDLCALC 132* 04/27/2010 1340    CBC    Component Value Date/Time   WBC 5.7 04/27/2010 1340   RBC 4.15 04/27/2010 1340   HGB 12.3 04/27/2010 1340   HCT 35.4* 04/27/2010 1340   PLT 249.0 04/27/2010 1340   MCV 85.3 04/27/2010 1340   MCHC 34.9 04/27/2010 1340   RDW 13.4 04/27/2010 1340   LYMPHSABS 2.4 04/27/2010 1340   MONOABS 0.5 04/27/2010 1340   EOSABS 0.2 04/27/2010 1340   BASOSABS 0.0 04/27/2010 1340    Hgb A1C No results found for this basename: HGBA1C        Assessment  & Plan:   Right shoulder pain, recurrent, likely arthritiis:  Will obtain xray of right shoulder eRx for Naproxen BID Shoulder exercise as shown on handout  RTC as needed

## 2012-12-07 NOTE — Patient Instructions (Signed)
Shoulder Pain  The shoulder is the joint that connects your arms to your body. The bones that form the shoulder joint include the upper arm bone (humerus), the shoulder blade (scapula), and the collarbone (clavicle). The top of the humerus is shaped like a ball and fits into a rather flat socket on the scapula (glenoid cavity). A combination of muscles and strong, fibrous tissues that connect muscles to bones (tendons) support your shoulder joint and hold the ball in the socket. Small, fluid-filled sacs (bursae) are located in different areas of the joint. They act as cushions between the bones and the overlying soft tissues and help reduce friction between the gliding tendons and the bone as you move your arm. Your shoulder joint allows a wide range of motion in your arm. This range of motion allows you to do things like scratch your back or throw a ball. However, this range of motion also makes your shoulder more prone to pain from overuse and injury.  Causes of shoulder pain can originate from both injury and overuse and usually can be grouped in the following four categories:   Redness, swelling, and pain (inflammation) of the tendon (tendinitis) or the bursae (bursitis).   Instability, such as a dislocation of the joint.   Inflammation of the joint (arthritis).   Broken bone (fracture).  HOME CARE INSTRUCTIONS    Apply ice to the sore area.   Put ice in a plastic bag.   Place a towel between your skin and the bag.   Leave the ice on for 15-20 minutes, 3-4 times per day for the first 2 days.   Stop using cold packs if they do not help with the pain.   If you have a shoulder sling or immobilizer, wear it as long as your caregiver instructs. Only remove it to shower or bathe. Move your arm as little as possible, but keep your hand moving to prevent swelling.   Squeeze a soft ball or foam pad as much as possible to help prevent swelling.   Only take over-the-counter or prescription medicines for pain,  discomfort, or fever as directed by your caregiver.  SEEK MEDICAL CARE IF:    Your shoulder pain increases, or new pain develops in your arm, hand, or fingers.   Your hand or fingers become cold and numb.   Your pain is not relieved with medicines.  SEEK IMMEDIATE MEDICAL CARE IF:    Your arm, hand, or fingers are numb or tingling.   Your arm, hand, or fingers are significantly swollen or turn white or blue.  MAKE SURE YOU:    Understand these instructions.   Will watch your condition.   Will get help right away if you are not doing well or get worse.  Document Released: 03/20/2005 Document Revised: 03/04/2012 Document Reviewed: 05/25/2011  ExitCare Patient Information 2014 [employer].

## 2013-05-03 ENCOUNTER — Ambulatory Visit: Payer: BC Managed Care – PPO | Admitting: Internal Medicine

## 2013-05-03 ENCOUNTER — Encounter: Payer: Self-pay | Admitting: Internal Medicine

## 2013-05-03 ENCOUNTER — Ambulatory Visit (INDEPENDENT_AMBULATORY_CARE_PROVIDER_SITE_OTHER): Payer: BC Managed Care – PPO | Admitting: Internal Medicine

## 2013-05-03 ENCOUNTER — Ambulatory Visit (INDEPENDENT_AMBULATORY_CARE_PROVIDER_SITE_OTHER)
Admission: RE | Admit: 2013-05-03 | Discharge: 2013-05-03 | Disposition: A | Discharge status: Home / Self Care | Payer: BC Managed Care – PPO | Source: Ambulatory Visit | Attending: Internal Medicine | Admitting: Internal Medicine

## 2013-05-03 VITALS — BP 118/70 | HR 73 | Temp 97.8°F | Resp 16 | Ht 62.0 in | Wt 190.0 lb

## 2013-05-03 DIAGNOSIS — M25521 Pain in right elbow: Secondary | ICD-10-CM

## 2013-05-03 DIAGNOSIS — M25529 Pain in unspecified elbow: Secondary | ICD-10-CM

## 2013-05-03 DIAGNOSIS — M25559 Pain in unspecified hip: Secondary | ICD-10-CM

## 2013-05-03 DIAGNOSIS — Z23 Encounter for immunization: Secondary | ICD-10-CM

## 2013-05-03 DIAGNOSIS — M25551 Pain in right hip: Secondary | ICD-10-CM

## 2013-05-03 MED ORDER — IBUPROFEN 600 MG PO TABS: 600.0000 mg | ORAL_TABLET | Freq: Three times a day (TID) | ORAL | Status: DC | PRN

## 2013-05-03 NOTE — Progress Notes (Signed)
Pre visit review using our clinic review tool, if applicable. No additional management support is needed unless otherwise documented below in the visit note. 

## 2013-05-03 NOTE — Patient Instructions (Signed)
Osteoarthritis Osteoarthritis is the most common form of arthritis. It is redness, soreness, and swelling (inflammation) affecting the cartilage. Cartilage acts as a cushion, covering the ends of bones where they meet to form a joint. CAUSES  Over time, the cartilage begins to wear away. This causes bone to rub on bone. This produces pain and stiffness in the affected joints. Factors that contribute to this problem are:  Excessive body weight.  Age.  Overuse of joints. SYMPTOMS   People with osteoarthritis usually experience joint pain, swelling, or stiffness.  Over time, the joint may lose its normal shape.  Small deposits of bone (osteophytes) may grow on the edges of the joint.  Bits of bone or cartilage can break off and float inside the joint space. This may cause more pain and damage.  Osteoarthritis can lead to depression, anxiety, feelings of helplessness, and limitations on daily activities. The most commonly affected joints are in the:  Ends of the fingers.  Thumbs.  Neck.  Lower back.  Knees.  Hips. DIAGNOSIS  Diagnosis is mostly based on your symptoms and exam. Tests may be helpful, including:  X-rays of the affected joint.  A computerized magnetic scan (MRI).  Blood tests to rule out other types of arthritis.  Joint fluid tests. This involves using a needle to draw fluid from the joint and examining the fluid under a microscope. TREATMENT  Goals of treatment are to control pain, improve joint function, maintain a normal body weight, and maintain a healthy lifestyle. Treatment approaches may include:  A prescribed exercise program with rest and joint relief.  Weight control with nutritional education.  Pain relief techniques such as:  Properly applied heat and cold.  Electric pulses delivered to nerve endings under the skin (transcutaneous electrical nerve stimulation, TENS).  Massage.  Certain supplements. Ask your caregiver before using any  supplements, especially in combination with prescribed drugs.  Medicines to control pain, such as:  Acetaminophen.  Nonsteroidal anti-inflammatory drugs (NSAIDs), such as naproxen.  Narcotic or central-acting agents, such as tramadol. This drug carries a risk of addiction and is generally prescribed for short-term use.  Corticosteroids. These can be given orally or as injection. This is a short-term treatment, not recommended for routine use.  Surgery to reposition the bones and relieve pain (osteotomy) or to remove loose pieces of bone and cartilage. Joint replacement may be needed in advanced states of osteoarthritis. HOME CARE INSTRUCTIONS  Your caregiver can recommend specific types of exercise. These may include:  Strengthening exercises. These are done to strengthen the muscles that support joints affected by arthritis. They can be performed with weights or with exercise bands to add resistance.  Aerobic activities. These are exercises, such as brisk walking or low-impact aerobics, that get your heart pumping. They can help keep your lungs and circulatory system in shape.  Range-of-motion activities. These keep your joints limber.  Balance and agility exercises. These help you maintain daily living skills. Learning about your condition and being actively involved in your care will help improve the course of your osteoarthritis. SEEK MEDICAL CARE IF:   You feel hot or your skin turns red.  You develop a rash in addition to your joint pain.  You have an oral temperature above 102 F (38.9 C). FOR MORE INFORMATION  National Institute of Arthritis and Musculoskeletal and Skin Diseases: www.niams.nih.gov National Institute on Aging: www.nia.nih.gov American College of Rheumatology: www.rheumatology.org Document Released: 06/10/2005 Document Revised: 09/02/2011 Document Reviewed: 09/21/2009 ExitCare Patient Information 2014 [employer].  

## 2013-05-03 NOTE — Progress Notes (Signed)
Subjective:    Patient ID: Melanie Garrett, female    DOB: May 07, 1956, 57 y.o.   MRN: [id number]  Arm Pain  Incident onset: for one month. There was no injury mechanism. The pain is present in the upper right arm. The quality of the pain is described as aching. The pain does not radiate. The pain is at a severity of 2/10. The pain is mild. The pain has been intermittent since the incident. Pertinent negatives include no chest pain, muscle weakness, numbness or tingling. The symptoms are aggravated by movement. She has tried nothing for the symptoms. The treatment provided no relief.      Review of Systems  Constitutional: Negative.  Negative for fever, chills, diaphoresis, appetite change and fatigue.  HENT: Negative.   Eyes: Negative.   Respiratory: Negative.  Negative for cough, chest tightness, shortness of breath, wheezing and stridor.   Cardiovascular: Negative.  Negative for chest pain, palpitations and leg swelling.  Gastrointestinal: Negative for nausea, vomiting, abdominal pain, diarrhea, constipation and blood in stool.  Endocrine: Negative.   Genitourinary: Negative.   Musculoskeletal: Positive for arthralgias. Negative for back pain, gait problem, joint swelling, myalgias, neck pain and neck stiffness.       She has diffuse soreness in her right upper arm. Shoulder down to the elbow  Skin: Negative.   Allergic/Immunologic: Negative.   Neurological: Negative.  Negative for dizziness, tingling, tremors, weakness and numbness.  Hematological: Negative.  Negative for adenopathy. Does not bruise/bleed easily.  Psychiatric/Behavioral: Negative.        Objective:   Physical Exam  Vitals reviewed. Constitutional: She is oriented to person, place, and time. She appears well-developed and well-nourished. No distress.  HENT:  Head: Normocephalic and atraumatic.  Mouth/Throat: Oropharynx is clear and moist. No oropharyngeal exudate.  Eyes: Conjunctivae are normal. Right eye exhibits  no discharge. Left eye exhibits no discharge. No scleral icterus.  Neck: Normal range of motion. Neck supple. No JVD present. No tracheal deviation present. No thyromegaly present.  Cardiovascular: Normal rate, regular rhythm, normal heart sounds and intact distal pulses.  Exam reveals no gallop and no friction rub.   No murmur heard. Pulmonary/Chest: Effort normal and breath sounds normal. No stridor. No respiratory distress. She has no wheezes. She has no rales. She exhibits no tenderness.  Abdominal: Soft. Bowel sounds are normal. She exhibits no distension and no mass. There is no tenderness. There is no rebound and no guarding.  Musculoskeletal: Normal range of motion. She exhibits no edema and no tenderness.       Right shoulder: Normal.       Right elbow: Normal.      Cervical back: Normal. She exhibits normal range of motion, no tenderness, no bony tenderness, no swelling, no edema, no deformity, no laceration, no pain, no spasm and normal pulse.       Right upper arm: Normal. She exhibits no tenderness, no bony tenderness, no swelling, no edema, no deformity and no laceration.  Lymphadenopathy:    She has no cervical adenopathy.  Neurological: She is alert and oriented to person, place, and time. She has normal strength. She displays no atrophy, no tremor and normal reflexes. No cranial nerve deficit or sensory deficit. She exhibits normal muscle tone. She displays a negative Romberg sign. She displays no seizure activity. Coordination and gait normal.  Reflex Scores:      Tricep reflexes are 1+ on the right side and 1+ on the left side.      Bicep  reflexes are 1+ on the right side and 1+ on the left side.      Brachioradialis reflexes are 1+ on the right side and 1+ on the left side.      Patellar reflexes are 1+ on the right side and 1+ on the left side.      Achilles reflexes are 1+ on the left side. Skin: Skin is warm and dry. No rash noted. She is not diaphoretic. No erythema. No  pallor.  Psychiatric: She has a normal mood and affect. Her behavior is normal. Judgment and thought content normal.      Lab Results  Component Value Date   WBC 5.7 04/27/2010   HGB 12.3 04/27/2010   HCT 35.4* 04/27/2010   PLT 249.0 04/27/2010   GLUCOSE 87 04/27/2010   CHOL 193 04/27/2010   TRIG 66.0 04/27/2010   HDL 47.60 04/27/2010   LDLCALC 132* 04/27/2010   ALT 19 04/27/2010   AST 24 04/27/2010   NA 139 04/27/2010   K 4.2 04/27/2010   CL 104 04/27/2010   CREATININE 0.7 04/27/2010   BUN 11 04/27/2010   CO2 29 04/27/2010   TSH 2.21 04/27/2010      Assessment & Plan:

## 2013-05-03 NOTE — Assessment & Plan Note (Signed)
She has had some improvement in this

## 2013-05-03 NOTE — Assessment & Plan Note (Signed)
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

## 2013-05-04 NOTE — Addendum Note (Signed)
Addended by: Rock Nephew T on: 05/04/2013 09:37 AM   Modules accepted: Orders

## 2014-01-07 ENCOUNTER — Other Ambulatory Visit: Payer: Self-pay | Admitting: Internal Medicine

## 2014-01-07 LAB — HM MAMMOGRAPHY: HM MAMMO: NORMAL

## 2014-07-01 ENCOUNTER — Ambulatory Visit (INDEPENDENT_AMBULATORY_CARE_PROVIDER_SITE_OTHER): Payer: BLUE CROSS/BLUE SHIELD | Admitting: Family

## 2014-07-01 ENCOUNTER — Encounter: Payer: Self-pay | Admitting: Family

## 2014-07-01 ENCOUNTER — Ambulatory Visit (INDEPENDENT_AMBULATORY_CARE_PROVIDER_SITE_OTHER)
Admission: RE | Admit: 2014-07-01 | Discharge: 2014-07-01 | Disposition: A | Discharge status: Home / Self Care | Payer: BLUE CROSS/BLUE SHIELD | Source: Ambulatory Visit | Attending: Family | Admitting: Family

## 2014-07-01 VITALS — BP 130/88 | HR 75 | Temp 97.7°F | Resp 18 | Ht 63.0 in | Wt 185.4 lb

## 2014-07-01 DIAGNOSIS — M5440 Lumbago with sciatica, unspecified side: Secondary | ICD-10-CM

## 2014-07-01 DIAGNOSIS — M545 Low back pain, unspecified: Secondary | ICD-10-CM | POA: Insufficient documentation

## 2014-07-01 MED ORDER — TRAMADOL HCL 50 MG PO TABS: 50.0000 mg | ORAL_TABLET | Freq: Three times a day (TID) | ORAL | Status: DC | PRN

## 2014-07-01 MED ORDER — METHYLPREDNISOLONE (PAK) 4 MG PO TABS: ORAL_TABLET | ORAL | Status: DC

## 2014-07-01 MED ORDER — CYCLOBENZAPRINE HCL 5 MG PO TABS: 5.0000 mg | ORAL_TABLET | Freq: Three times a day (TID) | ORAL | Status: DC | PRN

## 2014-07-01 NOTE — Progress Notes (Signed)
   Subjective:    Patient ID: Melanie Garrett, female    DOB: [birth year], 59 y.o.   MRN: [id number]  Chief Complaint  Patient presents with  . 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 female who presents today for an office visit.   Acute symptoms of sharp low back pain that radiates down into both legs has been going on for approximately one month. Intensity is rated a 10/10. Sitting down makes it worse. Has tried Tylenol and 600 mg of Motrin which provided minimal relief. Denies any specific event that lead to this. Denies any sounds or sensations heard or felt.    No Known Allergies   Current Outpatient Prescriptions on File Prior to Visit  Medication Sig Dispense Refill  . ibuprofen (ADVIL,MOTRIN) 600 MG tablet TAKE ONE TABLET BY MOUTH EVERY 8 HOURS AS NEEDED 90 tablet 0   No current facility-administered medications on file prior to visit.    Review of Systems  Musculoskeletal: Positive for back pain. Negative for neck pain.  Neurological: Negative for numbness.      Objective:    BP 130/88 mmHg  Pulse 75  Temp(Src) 97.7 F (36.5 C) (Oral)  Resp 18  Ht 5\' 3"  (1.6 m)  Wt 185 lb 6.4 oz (84.097 kg)  BMI 32.85 kg/m2  SpO2 99% Nursing note and vital signs reviewed.  Physical Exam  Constitutional: She is oriented to person, place, and time. She appears well-developed and well-nourished. No distress.  Obese female seated in chair, appears her stated age, and is dressed appropriately for the situation.  Cardiovascular: Normal rate, regular rhythm, normal heart sounds and intact distal pulses.   Pulmonary/Chest: Effort normal and breath sounds normal.  Musculoskeletal:  No obvious deformity, discoloration, or edema of lower back noted. Palpable tenderness of lumbar spine and paraspinal musculature. Range of motion in all directions is  intact and appropriate. Some mild discomfort is felt with extension of the lumbar spine and left lateral rotation. Pulses, reflexes, and sensation are intact and appropriate distally. Straight leg raise is negative.  Neurological: She is alert and oriented to person, place, and time.  Skin: Skin is warm and dry.  Psychiatric: She has a normal mood and affect. Her behavior is normal. Judgment and thought content normal.       Assessment & Plan:

## 2014-07-01 NOTE — Progress Notes (Signed)
Pre visit review using our clinic review tool, if applicable. No additional management support is needed unless otherwise documented below in the visit note. 

## 2014-07-01 NOTE — Patient Instructions (Signed)
Thank you for choosing [facility].  Summary/Instructions:  Your prescription(s) have been submitted to your pharmacy or been printed and provided for you. Please take as directed and contact our office if you believe you are having problem(s) with the medication(s) or have any questions.  Please stop by radiology on the basement level of the building for your x-rays. Your results will be released to [facility] (or called to you) after review, usually within 72 hours after test completion. If any treatments or changes are necessary, you will be notified at that same time.  If your symptoms worsen or fail to improve, please contact our office for further instruction, or in case of emergency go directly to the emergency room at the closest medical facility.    Back Pain, Adult Low back pain is very common. About 1 in 5 people have back pain.The cause of low back pain is rarely dangerous. The pain often gets better over time.About half of people with a sudden onset of back pain feel better in just 2 weeks. About 8 in 10 people feel better by 6 weeks.  CAUSES Some common causes of back pain include:  Strain of the muscles or ligaments supporting the spine.  Wear and tear (degeneration) of the spinal discs.  Arthritis.  Direct injury to the back. DIAGNOSIS Most of the time, the direct cause of low back pain is not known.However, back pain can be treated effectively even when the exact cause of the pain is unknown.Answering your caregiver's questions about your overall health and symptoms is one of the most accurate ways to make sure the cause of your pain is not dangerous. If your caregiver needs more information, he or she may order lab work or imaging tests (X-rays or MRIs).However, even if imaging tests show changes in your back, this usually does not require surgery. HOME CARE INSTRUCTIONS For many people, back pain returns.Since low back pain is rarely dangerous, it is often a  condition that people can learn to [facility] their own.   Remain active. It is stressful on the back to sit or stand in one place. Do not sit, drive, or stand in one place for more than 30 minutes at a time. Take short walks on level surfaces as soon as pain allows.Try to increase the length of time you walk each day.  Do not stay in bed.Resting more than 1 or 2 days can delay your recovery.  Do not avoid exercise or work.Your body is made to move.It is not dangerous to be active, even though your back may hurt.Your back will likely heal faster if you return to being active before your pain is gone.  Pay attention to your body when you bend and lift. Many people have less discomfortwhen lifting if they bend their knees, keep the load close to their bodies,and avoid twisting. Often, the most comfortable positions are those that put less stress on your recovering back.  Find a comfortable position to sleep. Use a firm mattress and lie on your side with your knees slightly bent. If you lie on your back, put a pillow under your knees.  Only take over-the-counter or prescription medicines as directed by your caregiver. Over-the-counter medicines to reduce pain and inflammation are often the most helpful.Your caregiver may prescribe muscle relaxant drugs.These medicines help dull your pain so you can more quickly return to your normal activities and healthy exercise.  Put ice on the injured area.  Put ice in a plastic bag.  Place  a towel between your skin and the bag.  Leave the ice on for 15-20 minutes, 03-04 times a day for the first 2 to 3 days. After that, ice and heat may be alternated to reduce pain and spasms.  Ask your caregiver about trying back exercises and gentle massage. This may be of some benefit.  Avoid feeling anxious or stressed.Stress increases muscle tension and can worsen back pain.It is important to recognize when you are anxious or stressed and learn ways to  manage it.Exercise is a great option. SEEK MEDICAL CARE IF:  You have pain that is not relieved with rest or medicine.  You have pain that does not improve in 1 week.  You have new symptoms.  You are generally not feeling well. SEEK IMMEDIATE MEDICAL CARE IF:   You have pain that radiates from your back into your legs.  You develop new bowel or bladder control problems.  You have unusual weakness or numbness in your arms or legs.  You develop nausea or vomiting.  You develop abdominal pain.  You feel faint. Document Released: 06/10/2005 Document Revised: 12/10/2011 Document Reviewed: 10/12/2013 [facility] Patient Information 2015 [place], [place]. This information is not intended to replace advice given to you by your health care provider. Make sure you discuss any questions you have with your health care provider. Back Injury Prevention Back injuries can be extremely painful and difficult to heal. After having one back injury, you are much more likely to experience another later on. It is important to learn how to avoid injuring or re-injuring your back. The following tips can help you to prevent a back injury. PHYSICAL FITNESS  Exercise regularly and try to develop good tone in your abdominal muscles. Your abdominal muscles provide a lot of the support needed by your back.  Do aerobic exercises (walking, jogging, biking, swimming) regularly.  Do exercises that increase balance and strength (tai chi, yoga) regularly. This can decrease your risk of falling and injuring your back.  Stretch before and after exercising.  Maintain a healthy weight. The more you weigh, the more stress is placed on your back. For every pound of weight, 10 times that amount of pressure is placed on the back. DIET  Talk to your caregiver about how much calcium and vitamin D you need per day. These nutrients help to prevent weakening of the bones (osteoporosis). Osteoporosis can cause broken (fractured)  bones that lead to back pain.  Include good sources of calcium in your diet, such as dairy products, green, leafy vegetables, and products with calcium added (fortified).  Include good sources of vitamin D in your diet, such as milk and foods that are fortified with vitamin D.  Consider taking a nutritional supplement or a multivitamin if needed.  Stop smoking if you smoke. POSTURE  Sit and stand up straight. Avoid leaning forward when you sit or hunching over when you stand.  Choose chairs with good low back (lumbar) support.  If you work at a desk, sit close to your work so you do not need to lean over. Keep your chin tucked in. Keep your neck drawn back and elbows bent at a right angle. Your arms should look like the letter "L."  Sit high and close to the steering wheel when you drive. Add a lumbar support to your car seat if needed.  Avoid sitting or standing in one position for too long. Take breaks to get up, stretch, and walk around at least once every hour. Take  breaks if you are driving for long periods of time.  Sleep on your side with your knees slightly bent, or sleep on your back with a pillow under your knees. Do not sleep on your stomach. LIFTING, TWISTING, AND REACHING  Avoid heavy lifting, especially repetitive lifting. If you must do heavy lifting:  Stretch before lifting.  Work slowly.  Rest between lifts.  Use carts and dollies to move objects when possible.  Make several small trips instead of carrying 1 heavy load.  Ask for help when you need it.  Ask for help when moving big, awkward objects.  Follow these steps when lifting:  Stand with your feet shoulder-width apart.  Get as close to the object as you can. Do not try to pick up heavy objects that are far from your body.  Use handles or lifting straps if they are available.  Bend at your knees. Squat down, but keep your heels off the floor.  Keep your shoulders pulled back, your chin tucked  in, and your back straight.  Lift the object slowly, tightening the muscles in your legs, abdomen, and buttocks. Keep the object as close to the center of your body as possible.  When you put a load down, use these same guidelines in reverse.  Do not:  Lift the object above your waist.  Twist at the waist while lifting or carrying a load. Move your feet if you need to turn, not your waist.  Bend over without bending at your knees.  Avoid reaching over your head, across a table, or for an object on a high surface. OTHER TIPS  Avoid wet floors and keep sidewalks clear of ice to prevent falls.  Do not sleep on a mattress that is too soft or too hard.  Keep items that are used frequently within easy reach.  Put heavier objects on shelves at waist level and lighter objects on lower or higher shelves.  Find ways to decrease your stress, such as exercise, massage, or relaxation techniques. Stress can build up in your muscles. Tense muscles are more vulnerable to injury.  Seek treatment for depression or anxiety if needed. These conditions can increase your risk of developing back pain. SEEK MEDICAL CARE IF:  You injure your back.  You have questions about diet, exercise, or other ways to prevent back injuries. MAKE SURE YOU:  Understand these instructions.  Will watch your condition.  Will get help right away if you are not doing well or get worse. Document Released: 07/18/2004 Document Revised: 09/02/2011 Document Reviewed: 07/22/2011 [facility] Patient Information 2015 [place], [place]. This information is not intended to replace advice given to you by your health care provider. Make sure you discuss any questions you have with your health care provider.

## 2014-07-01 NOTE — Assessment & Plan Note (Signed)
Non-distinct low back pain with possible etiologies of being overweight, muscle tightness and imbalance, or poor lifting techniques. Obtain lumbar films to rule out any fractures or changes in intravertebral disc space. Start Medrol Dosepak. Start cyclobenzaprine as needed for sleep and muscle spasm. Start tramadol as needed for pain. Follow up if symptoms worsen or fail to improve and pending x-ray results.

## 2014-07-02 ENCOUNTER — Telehealth: Payer: Self-pay | Admitting: Family

## 2014-07-02 DIAGNOSIS — M545 Low back pain: Secondary | ICD-10-CM

## 2014-07-02 NOTE — Telephone Encounter (Signed)
Please inform the patient that her x-ray results showed that there is a small amount of degenerative disc disease in her back. For this if the steroids helped, we can refer her to a back specialist for potential cortisone injections in her back. This is not something that needs any surgery at this time.

## 2014-07-04 NOTE — Telephone Encounter (Signed)
Pt is aware of xray results. Would like a referral to back specialist and wanted to know if she could get a note for work to do light duty and no lifting due to her back. She works as a [employer] manager. Please advise

## 2014-07-04 NOTE — Telephone Encounter (Signed)
Referral made to orthopedics. May write letter for light duty.

## 2014-10-12 IMAGING — CR DG HUMERUS 2V *R*
2 series · 2 of 2 positions shown · non-contrast
Comparison: 12/07/2012 right shoulder radiographs.

CLINICAL DATA: Right arm pain for 2 weeks. No injury.

EXAM:
RIGHT HUMERUS - 2+ VIEW

[view not recorded (1 of 2)]
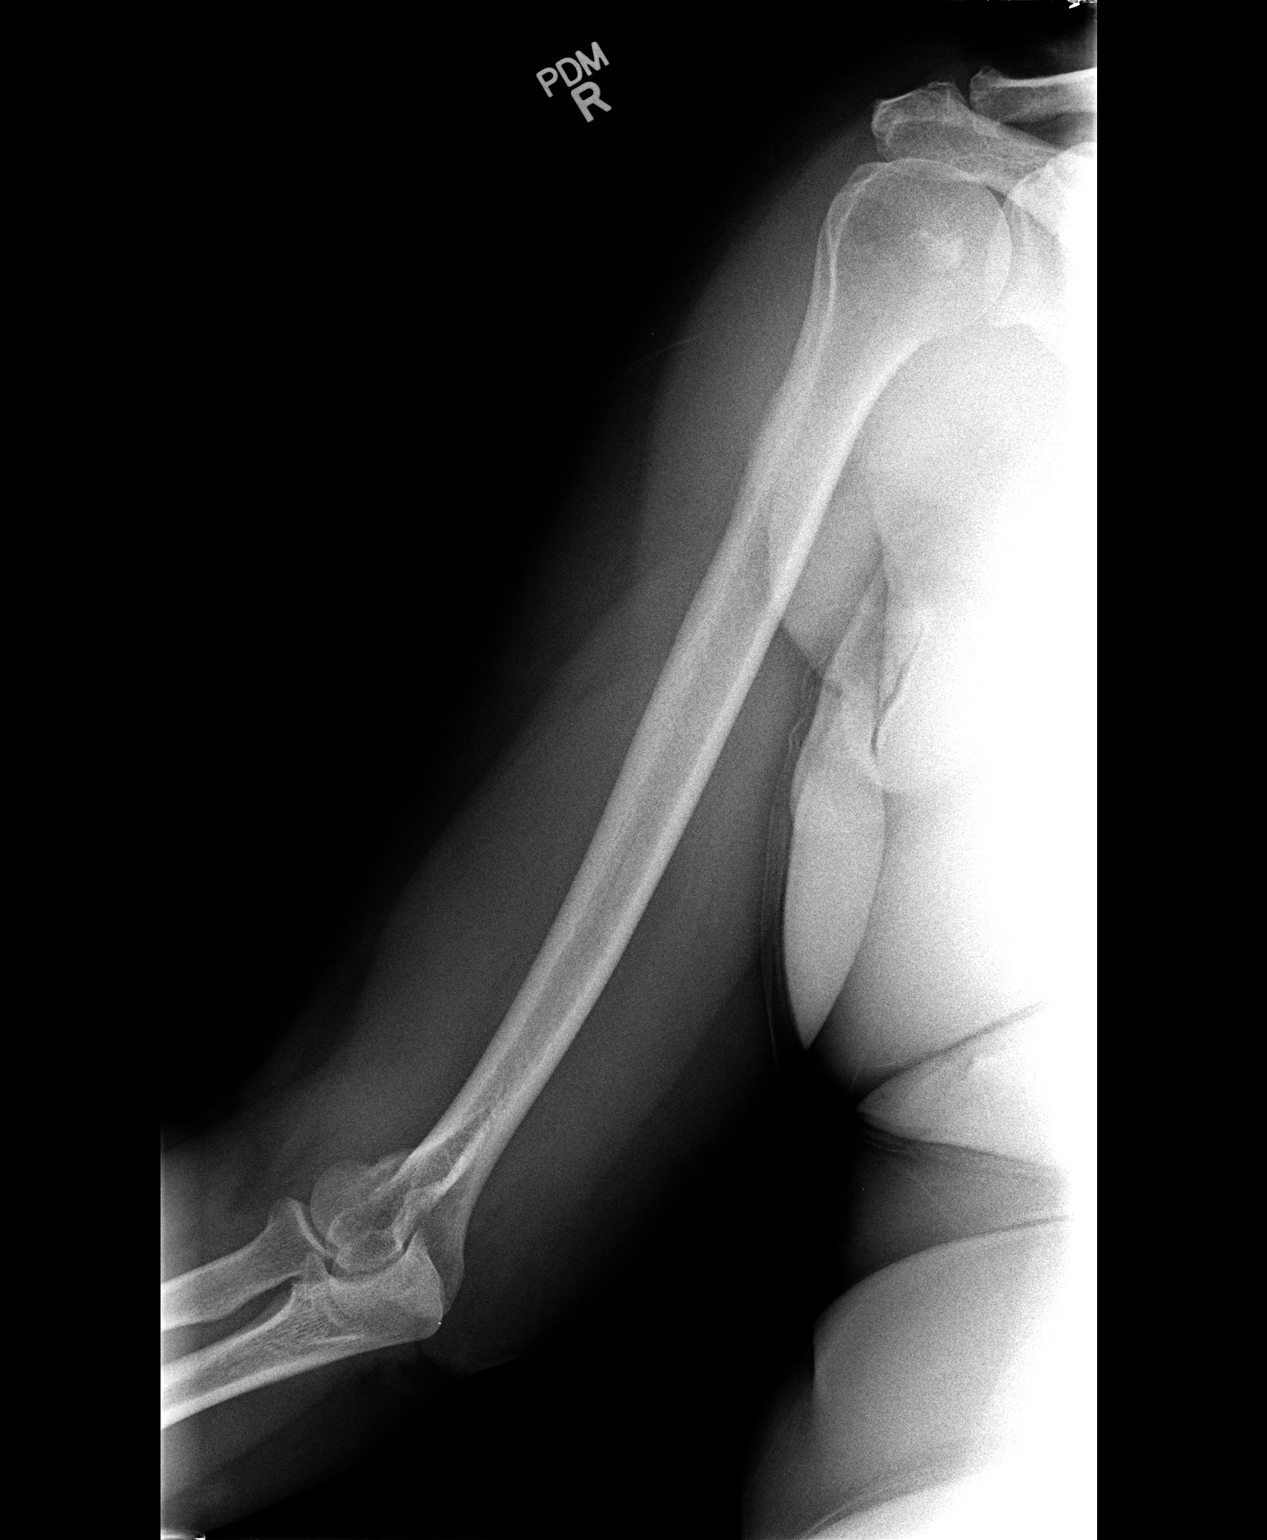

[view not recorded (2 of 2)]
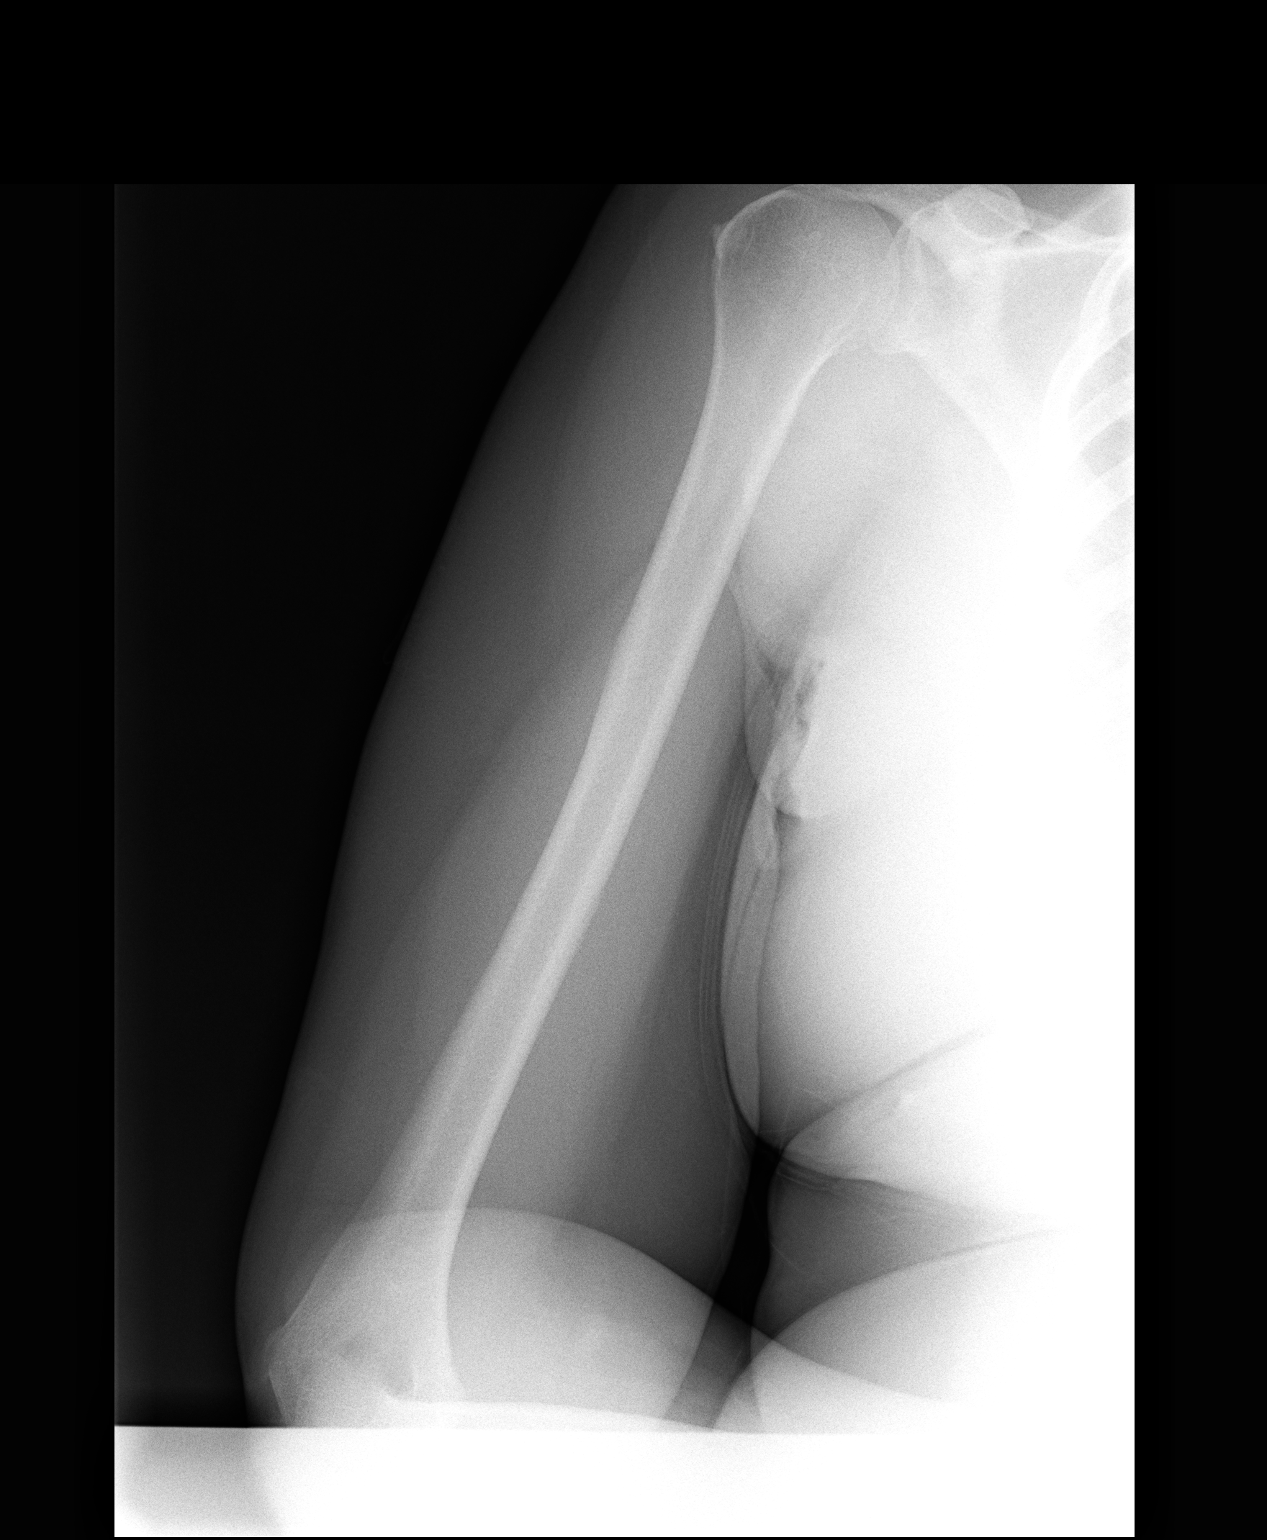

[2 of 2 positions shown; findings below may reference images not displayed]

FINDINGS: Lobulated calcified lesion is present in the proximal right humerus,
better seen on prior shoulder radiographs. This is most compatible
with a calcified enchondroma or old bone infarct. Calcific
tendinitis of the rotator cuff is incidentally noted. Soft tissues
appear within normal limits. Moderate AC joint osteoarthritis
incidentally noted.
IMPRESSION: No acute osseous abnormality. Incidental findings noted above. 15 mm
calcified lesion in the humeral head appears benign and likely
represents calcified enchondroma.

## 2015-01-14 LAB — HM MAMMOGRAPHY: HM MAMMO: NORMAL

## 2015-02-02 ENCOUNTER — Encounter: Payer: Self-pay | Admitting: Internal Medicine

## 2015-02-02 ENCOUNTER — Ambulatory Visit (INDEPENDENT_AMBULATORY_CARE_PROVIDER_SITE_OTHER): Payer: BLUE CROSS/BLUE SHIELD | Admitting: Internal Medicine

## 2015-02-02 VITALS — BP 120/78 | HR 81 | Temp 98.4°F | Resp 16 | Ht 63.0 in | Wt 183.0 lb

## 2015-02-02 DIAGNOSIS — L301 Dyshidrosis [pompholyx]: Secondary | ICD-10-CM

## 2015-02-02 MED ORDER — FLUOCINONIDE-E 0.05 % EX CREA: 1.0000 "application " | TOPICAL_CREAM | Freq: Two times a day (BID) | CUTANEOUS | Status: DC

## 2015-02-02 MED ORDER — HYDROXYZINE HCL 25 MG PO TABS: 25.0000 mg | ORAL_TABLET | Freq: Three times a day (TID) | ORAL | Status: DC | PRN

## 2015-02-02 NOTE — Progress Notes (Signed)
Pre visit review using our clinic review tool, if applicable. No additional management support is needed unless otherwise documented below in the visit note. 

## 2015-02-02 NOTE — Patient Instructions (Signed)
Eczema Eczema, also called atopic dermatitis, is a skin disorder that causes inflammation of the skin. It causes a red rash and dry, scaly skin. The skin becomes very itchy. Eczema is generally worse during the cooler winter months and often improves with the warmth of summer. Eczema usually starts showing signs in infancy. Some children outgrow eczema, but it may last through adulthood.  CAUSES  The exact cause of eczema is not known, but it appears to run in families. People with eczema often have a family history of eczema, allergies, asthma, or hay fever. Eczema is not contagious. Flare-ups of the condition may be caused by:   Contact with something you are sensitive or allergic to.   Stress. SIGNS AND SYMPTOMS  Dry, scaly skin.   Red, itchy rash.   Itchiness. This may occur before the skin rash and may be very intense.  DIAGNOSIS  The diagnosis of eczema is usually made based on symptoms and medical history. TREATMENT  Eczema cannot be cured, but symptoms usually can be controlled with treatment and other strategies. A treatment plan might include:  Controlling the itching and scratching.   Use over-the-counter antihistamines as directed for itching. This is especially useful at night when the itching tends to be worse.   Use over-the-counter steroid creams as directed for itching.   Avoid scratching. Scratching makes the rash and itching worse. It may also result in a skin infection (impetigo) due to a break in the skin caused by scratching.   Keeping the skin well moisturized with creams every day. This will seal in moisture and help prevent dryness. Lotions that contain alcohol and water should be avoided because they can dry the skin.   Limiting exposure to things that you are sensitive or allergic to (allergens).   Recognizing situations that cause stress.   Developing a plan to manage stress.  HOME CARE INSTRUCTIONS   Only take over-the-counter or  prescription medicines as directed by your health care provider.   Do not use anything on the skin without checking with your health care provider.   Keep baths or showers short (5 minutes) in warm (not hot) water. Use mild cleansers for bathing. These should be unscented. You may add nonperfumed bath oil to the bath water. It is best to avoid soap and bubble bath.   Immediately after a bath or shower, when the skin is still damp, apply a moisturizing ointment to the entire body. This ointment should be a petroleum ointment. This will seal in moisture and help prevent dryness. The thicker the ointment, the better. These should be unscented.   Keep fingernails cut short. Children with eczema may need to wear soft gloves or mittens at night after applying an ointment.   Dress in clothes made of cotton or cotton blends. Dress lightly, because heat increases itching.   A child with eczema should stay away from anyone with fever blisters or cold sores. The virus that causes fever blisters (herpes simplex) can cause a serious skin infection in children with eczema. SEEK MEDICAL CARE IF:   Your itching interferes with sleep.   Your rash gets worse or is not better within 1 week after starting treatment.   You see pus or soft yellow scabs in the rash area.   You have a fever.   You have a rash flare-up after contact with someone who has fever blisters.  Document Released: 06/07/2000 Document Revised: 03/31/2013 Document Reviewed: 01/11/2013 [employer] Patient Information 2015 [employer]. This information   is not intended to replace advice given to you by your health care provider. Make sure you discuss any questions you have with your health care provider.  

## 2015-02-04 ENCOUNTER — Encounter: Payer: Self-pay | Admitting: Internal Medicine

## 2015-02-04 NOTE — Progress Notes (Signed)
Subjective:  Patient ID: Melanie Garrett, female    DOB: Nov 24, 1955  Age: 59 y.o. MRN: [id number]  CC: Rash   HPI Melanie Garrett presents for the complaint of an itchy rash behind her neck and on both arms for 1-2 weeks. She has not treated it.  Outpatient Prescriptions Prior to Visit  Medication Sig Dispense Refill  . cyclobenzaprine (FLEXERIL) 5 MG tablet Take 1 tablet (5 mg total) by mouth 3 (three) times daily as needed for muscle spasms. 30 tablet 0  . ibuprofen (ADVIL,MOTRIN) 600 MG tablet TAKE ONE TABLET BY MOUTH EVERY 8 HOURS AS NEEDED 90 tablet 0  . traMADol (ULTRAM) 50 MG tablet Take 1 tablet (50 mg total) by mouth every 8 (eight) hours as needed. 30 tablet 0  . methylPREDNIsolone (MEDROL DOSPACK) 4 MG tablet follow package directions 21 tablet 0   No facility-administered medications prior to visit.    ROS Review of Systems  Constitutional: Negative.  Negative for fever, chills, diaphoresis, appetite change and fatigue.  HENT: Negative.   Eyes: Negative.   Respiratory: Negative.  Negative for cough, choking, chest tightness, shortness of breath and stridor.   Cardiovascular: Negative.  Negative for chest pain, palpitations and leg swelling.  Gastrointestinal: Negative.  Negative for nausea, vomiting, abdominal pain, diarrhea, constipation and blood in stool.  Endocrine: Negative.   Genitourinary: Negative.   Musculoskeletal: Negative.  Negative for myalgias, joint swelling and arthralgias.  Skin: Positive for rash. Negative for color change, pallor and wound.  Allergic/Immunologic: Negative.   Neurological: Negative.   Hematological: Negative.  Negative for adenopathy. Does not bruise/bleed easily.  Psychiatric/Behavioral: Negative.     Objective:  BP 120/78 mmHg  Pulse 81  Temp(Src) 98.4 F (36.9 C) (Oral)  Ht 5\' 3"  (1.6 m)  Wt 183 lb (83.008 kg)  BMI 32.43 kg/m2  SpO2 97%  BP Readings from Last 3 Encounters:  02/02/15 120/78  07/01/14 130/88  05/03/13  118/70    Wt Readings from Last 3 Encounters:  02/02/15 183 lb (83.008 kg)  07/01/14 185 lb 6.4 oz (84.097 kg)  05/03/13 190 lb (86.183 kg)    Physical Exam  Constitutional: She is oriented to person, place, and time. No distress.  HENT:  Mouth/Throat: Oropharynx is clear and moist. No oropharyngeal exudate.  Eyes: Conjunctivae are normal. Right eye exhibits no discharge. Left eye exhibits no discharge. No scleral icterus.  Neck: Normal range of motion. Neck supple. No JVD present. No tracheal deviation present. No thyromegaly present.  Cardiovascular: Normal rate, regular rhythm, normal heart sounds and intact distal pulses.  Exam reveals no gallop and no friction rub.   No murmur heard. Pulmonary/Chest: Effort normal and breath sounds normal. No stridor. No respiratory distress. She has no wheezes. She has no rales. She exhibits no tenderness.  Abdominal: Soft. Bowel sounds are normal. She exhibits no distension and no mass. There is no tenderness. There is no rebound and no guarding.  Musculoskeletal: Normal range of motion. She exhibits no edema or tenderness.  Lymphadenopathy:    She has no cervical adenopathy.  Neurological: She is oriented to person, place, and time.  Skin: Skin is warm and dry. Abrasion, lesion and rash noted. No bruising, no burn, no ecchymosis, no laceration, no petechiae and no purpura noted. Rash is papular. Rash is not macular, not maculopapular, not nodular, not pustular, not vesicular and not urticarial. She is not diaphoretic. No cyanosis or erythema. No pallor. Nails show no clubbing.  Over the posterior aspect of  her neck and on both arms, symmetrically, around the proximal forearm, elbow, and distal humerus there are coalesced papules with xerosis and scale. Over the forearms there are several areas of excoriation that measure 2-3 mm each.  Psychiatric: She has a normal mood and affect. Her behavior is normal. Judgment and thought content normal.  Vitals  reviewed.   Lab Results  Component Value Date   WBC 5.7 04/27/2010   HGB 12.3 04/27/2010   HCT 35.4* 04/27/2010   PLT 249.0 04/27/2010   GLUCOSE 87 04/27/2010   CHOL 193 04/27/2010   TRIG 66.0 04/27/2010   HDL 47.60 04/27/2010   LDLCALC 132* 04/27/2010   ALT 19 04/27/2010   AST 24 04/27/2010   NA 139 04/27/2010   K 4.2 04/27/2010   CL 104 04/27/2010   CREATININE 0.7 04/27/2010   BUN 11 04/27/2010   CO2 29 04/27/2010   TSH 2.21 04/27/2010    Dg Lumbar Spine Complete  07/01/2014   CLINICAL DATA:  Lower back with radiating to both legs, bilateral sciatica x 1 mo, No known injury.  EXAM: LUMBAR SPINE - COMPLETE 4+ VIEW  COMPARISON:  None.  FINDINGS: There are 5 nonrib bearing lumbar-type vertebral bodies.  The vertebral body heights are maintained.  There is grade 1 anterolisthesis of L3 on L4 likely secondary to facet disease. There is no spondylolysis.  There is no acute fracture.  There is degenerative disc disease at L3-4.  The SI joints are unremarkable.  IMPRESSION: No acute osseous injury of the lumbar spine.  Mild lumbar spine spondylosis at L3-4.   Electronically Signed   By: Kathreen Devoid   On: 07/01/2014 16:59    Assessment & Plan:   Melanie Garrett was seen today for rash.  Diagnoses and all orders for this visit:  Eczema, dyshidrotic- I will treat the itching with systemic antihistamines, will treat the rash and other symptoms with topical steroids. -     fluocinonide-emollient (LIDEX-E) 0.05 % cream; Apply 1 application topically 2 (two) times daily. -     hydrOXYzine (ATARAX/VISTARIL) 25 MG tablet; Take 1 tablet (25 mg total) by mouth 3 (three) times daily as needed.   I have discontinued Melanie Garrett's ibuprofen, methylPREDNIsolone, cyclobenzaprine, and traMADol. I am also having her start on fluocinonide-emollient and hydrOXYzine.  Meds ordered this encounter  Medications  . fluocinonide-emollient (LIDEX-E) 0.05 % cream    Sig: Apply 1 application topically 2 (two) times  daily.    Dispense:  60 g    Refill:  1  . hydrOXYzine (ATARAX/VISTARIL) 25 MG tablet    Sig: Take 1 tablet (25 mg total) by mouth 3 (three) times daily as needed.    Dispense:  90 tablet    Refill:  1     Follow-up: Return in about 3 weeks (around 02/23/2015).  [name], MD

## 2015-02-28 ENCOUNTER — Ambulatory Visit (INDEPENDENT_AMBULATORY_CARE_PROVIDER_SITE_OTHER): Payer: BLUE CROSS/BLUE SHIELD | Admitting: Internal Medicine

## 2015-02-28 ENCOUNTER — Encounter: Payer: Self-pay | Admitting: Internal Medicine

## 2015-02-28 VITALS — BP 110/74 | HR 73 | Temp 98.3°F | Resp 16 | Ht 63.0 in | Wt 186.0 lb

## 2015-02-28 DIAGNOSIS — L301 Dyshidrosis [pompholyx]: Secondary | ICD-10-CM

## 2015-02-28 NOTE — Progress Notes (Signed)
Pre visit review using our clinic review tool, if applicable. No additional management support is needed unless otherwise documented below in the visit note. 

## 2015-03-01 NOTE — Assessment & Plan Note (Signed)
Improvement noted, she will continue the current therapy.

## 2015-03-01 NOTE — Progress Notes (Signed)
Subjective:  Patient ID: Melanie Garrett, female    DOB: [birth year]  Age: 59 y.o. MRN: [id number]  CC: Rash   HPI Melanie Garrett presents for follow-up on itchy rash. The symptoms feel much better after about 3 weeks of treating with Lidex cream and taking hydroxyzine. She has a few excoriations but there is no more itching or scaling.  Outpatient Prescriptions Prior to Visit  Medication Sig Dispense Refill  . fluocinonide-emollient (LIDEX-E) 0.05 % cream Apply 1 application topically 2 (two) times daily. 60 g 1  . hydrOXYzine (ATARAX/VISTARIL) 25 MG tablet Take 1 tablet (25 mg total) by mouth 3 (three) times daily as needed. 90 tablet 1   No facility-administered medications prior to visit.    ROS Review of Systems  Constitutional: Negative.   HENT: Negative.   Eyes: Negative.   Respiratory: Negative.  Negative for cough, choking, chest tightness, shortness of breath and stridor.   Cardiovascular: Negative.  Negative for chest pain, palpitations and leg swelling.  Gastrointestinal: Negative.  Negative for nausea, vomiting, abdominal pain, diarrhea, constipation and blood in stool.  Endocrine: Negative.   Genitourinary: Negative.   Musculoskeletal: Negative.   Skin: Positive for rash.  Allergic/Immunologic: Negative.   Neurological: Negative.  Negative for dizziness, tremors, weakness and numbness.  Hematological: Negative.  Negative for adenopathy. Does not bruise/bleed easily.  Psychiatric/Behavioral: Negative.     Objective:  BP 110/74 mmHg  Pulse 73  Temp(Src) 98.3 F (36.8 C) (Oral)  Resp 16  Ht 5\' 3"  (1.6 m)  Wt 186 lb (84.369 kg)  BMI 32.96 kg/m2  SpO2 99%  BP Readings from Last 3 Encounters:  02/28/15 110/74  02/02/15 120/78  07/01/14 130/88    Wt Readings from Last 3 Encounters:  02/28/15 186 lb (84.369 kg)  02/02/15 183 lb (83.008 kg)  07/01/14 185 lb 6.4 oz (84.097 kg)    Physical Exam  Constitutional: She is oriented to person, place, and time. No  distress.  HENT:  Head: Normocephalic and atraumatic.  Mouth/Throat: Oropharynx is clear and moist. No oropharyngeal exudate.  Eyes: Conjunctivae are normal. Right eye exhibits no discharge. Left eye exhibits no discharge. No scleral icterus.  Neck: Normal range of motion. Neck supple. No JVD present. No tracheal deviation present. No thyromegaly present.  Cardiovascular: Normal rate, regular rhythm, normal heart sounds and intact distal pulses.  Exam reveals no gallop and no friction rub.   No murmur heard. Pulmonary/Chest: Effort normal and breath sounds normal. No stridor. No respiratory distress. She has no wheezes. She has no rales. She exhibits no tenderness.  Abdominal: Soft. Bowel sounds are normal. She exhibits no distension and no mass. There is no tenderness. There is no rebound and no guarding.  Musculoskeletal: Normal range of motion. She exhibits no edema or tenderness.  Lymphadenopathy:    She has no cervical adenopathy.  Neurological: She is oriented to person, place, and time.  Skin: Skin is warm and dry. Abrasion and rash noted. No purpura noted. Rash is papular. Rash is not macular, not maculopapular, not nodular, not pustular, not vesicular and not urticarial. She is not diaphoretic. No erythema. No pallor.  Over the dorsum of both forearms there are some healed excoriations with hyperpigmentation. There is no scale, erythema, exudate, pustules.  Vitals reviewed.   Lab Results  Component Value Date   WBC 5.7 04/27/2010   HGB 12.3 04/27/2010   HCT 35.4* 04/27/2010   PLT 249.0 04/27/2010   GLUCOSE 87 04/27/2010   CHOL 193 04/27/2010  TRIG 66.0 04/27/2010   HDL 47.60 04/27/2010   LDLCALC 132* 04/27/2010   ALT 19 04/27/2010   AST 24 04/27/2010   NA 139 04/27/2010   K 4.2 04/27/2010   CL 104 04/27/2010   CREATININE 0.7 04/27/2010   BUN 11 04/27/2010   CO2 29 04/27/2010   TSH 2.21 04/27/2010    Dg Lumbar Spine Complete  07/01/2014   CLINICAL DATA:  Lower back  with radiating to both legs, bilateral sciatica x 1 mo, No known injury.  EXAM: LUMBAR SPINE - COMPLETE 4+ VIEW  COMPARISON:  None.  FINDINGS: There are 5 nonrib bearing lumbar-type vertebral bodies.  The vertebral body heights are maintained.  There is grade 1 anterolisthesis of L3 on L4 likely secondary to facet disease. There is no spondylolysis.  There is no acute fracture.  There is degenerative disc disease at L3-4.  The SI joints are unremarkable.  IMPRESSION: No acute osseous injury of the lumbar spine.  Mild lumbar spine spondylosis at L3-4.   Electronically Signed   By: Kathreen Devoid   On: 07/01/2014 16:59    Assessment & Plan:   There are no diagnoses linked to this encounter. I am having Ms. Loughner maintain her fluocinonide-emollient and hydrOXYzine.  No orders of the defined types were placed in this encounter.     Follow-up: No Follow-up on file.  [name], MD

## 2015-06-01 ENCOUNTER — Encounter: Payer: Self-pay | Admitting: Internal Medicine

## 2015-06-01 ENCOUNTER — Ambulatory Visit (INDEPENDENT_AMBULATORY_CARE_PROVIDER_SITE_OTHER): Payer: BLUE CROSS/BLUE SHIELD | Admitting: Internal Medicine

## 2015-06-01 ENCOUNTER — Ambulatory Visit (INDEPENDENT_AMBULATORY_CARE_PROVIDER_SITE_OTHER)
Admission: RE | Admit: 2015-06-01 | Discharge: 2015-06-01 | Disposition: A | Discharge status: Home / Self Care | Payer: BLUE CROSS/BLUE SHIELD | Source: Ambulatory Visit | Attending: Internal Medicine | Admitting: Internal Medicine

## 2015-06-01 VITALS — BP 128/82 | HR 68 | Temp 97.9°F | Resp 20 | Ht 63.0 in | Wt 188.2 lb

## 2015-06-01 DIAGNOSIS — R05 Cough: Secondary | ICD-10-CM | POA: Diagnosis not present

## 2015-06-01 DIAGNOSIS — J209 Acute bronchitis, unspecified: Secondary | ICD-10-CM | POA: Diagnosis not present

## 2015-06-01 DIAGNOSIS — R059 Cough, unspecified: Secondary | ICD-10-CM

## 2015-06-01 MED ORDER — AZITHROMYCIN 500 MG PO TABS
500.0000 mg | ORAL_TABLET | Freq: Every day | ORAL | Status: DC
Start: 2015-06-01 — End: 2016-09-02

## 2015-06-01 MED ORDER — HYDROCODONE-HOMATROPINE 5-1.5 MG/5ML PO SYRP: 5.0000 mL | ORAL_SOLUTION | Freq: Three times a day (TID) | ORAL | Status: DC | PRN

## 2015-06-01 NOTE — Patient Instructions (Signed)

## 2015-06-01 NOTE — Progress Notes (Signed)
Subjective:  Patient ID: Melanie Garrett, female    DOB: 08-Nov-1955  Age: 59 y.o. MRN: [id number]  CC: Cough   HPI Melanie Garrett presents for a cough productive of brown phlegm for about 1 week. She also complains of chills, shortness of breath, sore throat and fatigue but she denies fever or night sweats.  Outpatient Prescriptions Prior to Visit  Medication Sig Dispense Refill  . fluocinonide-emollient (LIDEX-E) 0.05 % cream Apply 1 application topically 2 (two) times daily. 60 g 1  . hydrOXYzine (ATARAX/VISTARIL) 25 MG tablet Take 1 tablet (25 mg total) by mouth 3 (three) times daily as needed. 90 tablet 1   No facility-administered medications prior to visit.    ROS Review of Systems  Constitutional: Positive for fever, chills and fatigue. Negative for diaphoresis, activity change, appetite change and unexpected weight change.  HENT: Positive for sore throat. Negative for facial swelling, sinus pressure, tinnitus, trouble swallowing and voice change.   Eyes: Negative.   Respiratory: Positive for cough and shortness of breath. Negative for apnea, choking, chest tightness, wheezing and stridor.   Cardiovascular: Negative.  Negative for chest pain, palpitations and leg swelling.  Gastrointestinal: Negative.  Negative for nausea, vomiting, abdominal pain, diarrhea and constipation.  Endocrine: Negative.   Genitourinary: Negative.   Musculoskeletal: Negative.  Negative for myalgias, back pain, joint swelling and arthralgias.  Skin: Negative.  Negative for color change and rash.  Allergic/Immunologic: Negative.   Neurological: Negative.   Hematological: Negative.  Negative for adenopathy. Does not bruise/bleed easily.  Psychiatric/Behavioral: Negative.     Objective:  BP 128/82 mmHg  Pulse 68  Temp(Src) 97.9 F (36.6 C) (Oral)  Resp 20  Ht 5\' 3"  (1.6 m)  Wt 188 lb 4 oz (85.39 kg)  BMI 33.36 kg/m2  SpO2 98%  BP Readings from Last 3 Encounters:  06/01/15 128/82  02/28/15  110/74  02/02/15 120/78    Wt Readings from Last 3 Encounters:  06/01/15 188 lb 4 oz (85.39 kg)  02/28/15 186 lb (84.369 kg)  02/02/15 183 lb (83.008 kg)    Physical Exam  Constitutional: She is oriented to person, place, and time. She appears well-developed and well-nourished. No distress.  HENT:  Head: Normocephalic and atraumatic.  Mouth/Throat: Oropharynx is clear and moist. No oropharyngeal exudate.  Eyes: Conjunctivae are normal. Right eye exhibits no discharge. Left eye exhibits no discharge. No scleral icterus.  Neck: Normal range of motion. Neck supple. No JVD present. No tracheal deviation present. No thyromegaly present.  Cardiovascular: Normal rate, regular rhythm, normal heart sounds and intact distal pulses.  Exam reveals no gallop and no friction rub.   No murmur heard. Pulmonary/Chest: Effort normal and breath sounds normal. No stridor. No respiratory distress. She has no wheezes. She has no rales. She exhibits no tenderness.  Abdominal: Soft. Bowel sounds are normal. She exhibits no distension and no mass. There is no tenderness. There is no rebound and no guarding.  Musculoskeletal: Normal range of motion. She exhibits no edema or tenderness.  Lymphadenopathy:    She has no cervical adenopathy.  Neurological: She is oriented to person, place, and time.  Skin: Skin is warm and dry. No rash noted. She is not diaphoretic. No erythema. No pallor.  Vitals reviewed.   Lab Results  Component Value Date   WBC 5.7 04/27/2010   HGB 12.3 04/27/2010   HCT 35.4* 04/27/2010   PLT 249.0 04/27/2010   GLUCOSE 87 04/27/2010   CHOL 193 04/27/2010   TRIG 66.0  04/27/2010   HDL 47.60 04/27/2010   LDLCALC 132* 04/27/2010   ALT 19 04/27/2010   AST 24 04/27/2010   NA 139 04/27/2010   K 4.2 04/27/2010   CL 104 04/27/2010   CREATININE 0.7 04/27/2010   BUN 11 04/27/2010   CO2 29 04/27/2010   TSH 2.21 04/27/2010    Dg Lumbar Spine Complete  07/01/2014  CLINICAL DATA:  Lower  back with radiating to both legs, bilateral sciatica x 1 mo, No known injury. EXAM: LUMBAR SPINE - COMPLETE 4+ VIEW COMPARISON:  None. FINDINGS: There are 5 nonrib bearing lumbar-type vertebral bodies. The vertebral body heights are maintained. There is grade 1 anterolisthesis of L3 on L4 likely secondary to facet disease. There is no spondylolysis. There is no acute fracture. There is degenerative disc disease at L3-4. The SI joints are unremarkable. IMPRESSION: No acute osseous injury of the lumbar spine. Mild lumbar spine spondylosis at L3-4. Electronically Signed   By: Kathreen Devoid   On: 07/01/2014 16:59    Assessment & Plan:   Melanie Garrett was seen today for cough.  Diagnoses and all orders for this visit:  Cough- her chest x-ray is negative for mass, infiltrate or effusion. -     HYDROcodone-homatropine (HYCODAN) 5-1.5 MG/5ML syrup; Take 5 mLs by mouth every 8 (eight) hours as needed for cough. -     DG Chest 2 View; Future  Acute bronchitis, unspecified organism- will treat the infection with Zithromax and control the cough with Hycodan. -     azithromycin (ZITHROMAX) 500 MG tablet; Take 1 tablet (500 mg total) by mouth daily. -     HYDROcodone-homatropine (HYCODAN) 5-1.5 MG/5ML syrup; Take 5 mLs by mouth every 8 (eight) hours as needed for cough.   I am having Melanie Garrett start on azithromycin and HYDROcodone-homatropine. I am also having her maintain her fluocinonide-emollient and hydrOXYzine.  Meds ordered this encounter  Medications  . azithromycin (ZITHROMAX) 500 MG tablet    Sig: Take 1 tablet (500 mg total) by mouth daily.    Dispense:  3 tablet    Refill:  0  . HYDROcodone-homatropine (HYCODAN) 5-1.5 MG/5ML syrup    Sig: Take 5 mLs by mouth every 8 (eight) hours as needed for cough.    Dispense:  120 mL    Refill:  0     Follow-up: Return in about 3 weeks (around 06/22/2015).  [name], MD

## 2015-06-01 NOTE — Progress Notes (Signed)
Pre visit review using our clinic review tool, if applicable. No additional management support is needed unless otherwise documented below in the visit note. 

## 2015-06-02 ENCOUNTER — Telehealth: Payer: Self-pay | Admitting: Internal Medicine

## 2015-06-02 NOTE — Telephone Encounter (Signed)
Is returning your phone call

## 2015-07-26 ENCOUNTER — Encounter: Payer: Self-pay | Admitting: Gastroenterology

## 2016-01-27 LAB — HM MAMMOGRAPHY

## 2016-02-15 ENCOUNTER — Encounter: Payer: Self-pay | Admitting: Internal Medicine

## 2016-05-19 ENCOUNTER — Encounter (HOSPITAL_COMMUNITY): Payer: Self-pay

## 2016-05-19 ENCOUNTER — Emergency Department (HOSPITAL_COMMUNITY)
Admission: EM | Admit: 2016-05-19 | Discharge: 2016-05-19 | Disposition: A | Discharge status: Home / Self Care | Payer: No Typology Code available for payment source | Attending: Emergency Medicine | Admitting: Emergency Medicine

## 2016-05-19 DIAGNOSIS — Y939 Activity, unspecified: Secondary | ICD-10-CM | POA: Insufficient documentation

## 2016-05-19 DIAGNOSIS — S39012A Strain of muscle, fascia and tendon of lower back, initial encounter: Secondary | ICD-10-CM | POA: Diagnosis not present

## 2016-05-19 DIAGNOSIS — Y92410 Unspecified street and highway as the place of occurrence of the external cause: Secondary | ICD-10-CM | POA: Insufficient documentation

## 2016-05-19 DIAGNOSIS — Y999 Unspecified external cause status: Secondary | ICD-10-CM | POA: Insufficient documentation

## 2016-05-19 DIAGNOSIS — S3992XA Unspecified injury of lower back, initial encounter: Secondary | ICD-10-CM | POA: Diagnosis present

## 2016-05-19 MED ORDER — IBUPROFEN 400 MG PO TABS: 400.0000 mg | ORAL_TABLET | Freq: Four times a day (QID) | ORAL | 0 refills | Status: DC | PRN

## 2016-05-19 MED ORDER — METHOCARBAMOL 500 MG PO TABS: 500.0000 mg | ORAL_TABLET | Freq: Two times a day (BID) | ORAL | 0 refills | Status: DC
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
## 2016-05-19 NOTE — ED Notes (Signed)
Patient is alert and oriented x3.  She was given DC instructions and follow up visit instructions.  Patient gave verbal understanding. She was DC ambulatory under her own power to home.  V/S stable.  He was not showing any signs of distress on DC 
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
## 2016-08-19 ENCOUNTER — Encounter: Payer: BLUE CROSS/BLUE SHIELD | Admitting: Internal Medicine

## 2016-09-02 ENCOUNTER — Other Ambulatory Visit (INDEPENDENT_AMBULATORY_CARE_PROVIDER_SITE_OTHER): Payer: BLUE CROSS/BLUE SHIELD

## 2016-09-02 ENCOUNTER — Other Ambulatory Visit (HOSPITAL_COMMUNITY)
Admission: RE | Admit: 2016-09-02 | Discharge: 2016-09-02 | Disposition: A | Discharge status: Home / Self Care | Payer: BLUE CROSS/BLUE SHIELD | Source: Ambulatory Visit | Attending: Internal Medicine | Admitting: Internal Medicine

## 2016-09-02 ENCOUNTER — Encounter: Payer: Self-pay | Admitting: Internal Medicine

## 2016-09-02 ENCOUNTER — Ambulatory Visit (INDEPENDENT_AMBULATORY_CARE_PROVIDER_SITE_OTHER): Payer: BLUE CROSS/BLUE SHIELD | Admitting: Internal Medicine

## 2016-09-02 VITALS — BP 110/70 | HR 91 | Temp 98.7°F | Ht 63.0 in | Wt 187.0 lb

## 2016-09-02 DIAGNOSIS — Z01419 Encounter for gynecological examination (general) (routine) without abnormal findings: Secondary | ICD-10-CM | POA: Insufficient documentation

## 2016-09-02 DIAGNOSIS — L501 Idiopathic urticaria: Secondary | ICD-10-CM | POA: Diagnosis not present

## 2016-09-02 DIAGNOSIS — Z0000 Encounter for general adult medical examination without abnormal findings: Secondary | ICD-10-CM | POA: Diagnosis not present

## 2016-09-02 DIAGNOSIS — Z1151 Encounter for screening for human papillomavirus (HPV): Secondary | ICD-10-CM | POA: Diagnosis present

## 2016-09-02 DIAGNOSIS — Z124 Encounter for screening for malignant neoplasm of cervix: Secondary | ICD-10-CM

## 2016-09-02 DIAGNOSIS — Z1159 Encounter for screening for other viral diseases: Secondary | ICD-10-CM

## 2016-09-02 DIAGNOSIS — L301 Dyshidrosis [pompholyx]: Secondary | ICD-10-CM

## 2016-09-02 LAB — COMPREHENSIVE METABOLIC PANEL
ALBUMIN: 3.8 g/dL (ref 3.5–5.2)
ALT: 17 U/L (ref 0–35)
AST: 23 U/L (ref 0–37)
Alkaline Phosphatase: 59 U/L (ref 39–117)
BUN: 15 mg/dL (ref 6–23)
CHLORIDE: 103 meq/L (ref 96–112)
CO2: 28 meq/L (ref 19–32)
CREATININE: 0.61 mg/dL (ref 0.40–1.20)
Calcium: 9.8 mg/dL (ref 8.4–10.5)
GFR: 128.57 mL/min (ref >60.00)
Glucose, Bld: 68 mg/dL — ABNORMAL LOW (ref 70–99)
POTASSIUM: 4 meq/L (ref 3.5–5.1)
SODIUM: 137 meq/L (ref 135–145)
Total Bilirubin: 0.4 mg/dL (ref 0.2–1.2)
Total Protein: 7.6 g/dL (ref 6.0–8.3)

## 2016-09-02 LAB — CBC WITH DIFFERENTIAL/PLATELET
BASOS PCT: 0.6 % (ref 0.0–3.0)
Basophils Absolute: 0 10*3/uL (ref 0.0–0.1)
EOS PCT: 1.5 % (ref 0.0–5.0)
Eosinophils Absolute: 0.1 10*3/uL (ref 0.0–0.7)
HCT: 36.5 % (ref 36.0–46.0)
Hemoglobin: 12.3 g/dL (ref 12.0–15.0)
LYMPHS ABS: 2.2 10*3/uL (ref 0.7–4.0)
LYMPHS PCT: 38.4 % (ref 12.0–46.0)
MCHC: 33.8 g/dL (ref 30.0–36.0)
MCV: 84.9 fl (ref 78.0–100.0)
MONOS PCT: 9.9 % (ref 3.0–12.0)
Monocytes Absolute: 0.6 10*3/uL (ref 0.1–1.0)
NEUTROS ABS: 2.8 10*3/uL (ref 1.4–7.7)
Neutrophils Relative %: 49.6 % (ref 43.0–77.0)
Platelets: 258 10*3/uL (ref 150.0–400.0)
RBC: 4.3 Mil/uL (ref 3.87–5.11)
RDW: 13.3 % (ref 11.5–15.5)
WBC: 5.6 10*3/uL (ref 4.0–10.5)

## 2016-09-02 LAB — LIPID PANEL
CHOL/HDL RATIO: 4
CHOLESTEROL: 180 mg/dL (ref 0–200)
HDL: 45 mg/dL (ref >39.00)
LDL Cholesterol: 118 mg/dL — ABNORMAL HIGH (ref 0–99)
NonHDL: 135.49
Triglycerides: 87 mg/dL (ref 0.0–149.0)
VLDL: 17.4 mg/dL (ref 0.0–40.0)

## 2016-09-02 LAB — SEDIMENTATION RATE: Sed Rate: 36 mm/hr — ABNORMAL HIGH (ref 0–30)

## 2016-09-02 LAB — HEPATITIS C ANTIBODY: HCV AB: NEGATIVE

## 2016-09-02 MED ORDER — HYDROXYZINE HCL 25 MG PO TABS: 25.0000 mg | ORAL_TABLET | Freq: Three times a day (TID) | ORAL | 1 refills | Status: DC | PRN

## 2016-09-02 NOTE — Progress Notes (Signed)
Pre visit review using our clinic review tool, if applicable. No additional management support is needed unless otherwise documented below in the visit note. 

## 2016-09-02 NOTE — Patient Instructions (Signed)
Angioedema  Angioedema is the sudden swelling of tissue in the body. Angioedema can affect any part of the body, but it most often affects the deeper parts of the skin, causing red, itchy patches (hives) to appear over the affected area. It often begins during the night and is found in the morning. Depending on the cause, angioedema may happen:  Only once.  Several times. It may come back in unpredictable patterns.  Repeatedly for several years. Over time, it may gradually stop coming back.  Angioedema can be life-threatening if it affects the air passages that you breathe through. What are the causes? This condition may be caused by:  Foods, such as milk, eggs, shellfish, wheat, or nuts.  Certain medicines, such as ACE inhibitors, antibiotics, nonsteroidal anti-inflammatory drugs, birth control pills, or dyes used in X-rays.  Insect stings.  Infections.  Angioedema can be inherited, and episodes can be triggered by:  Mild injury.  Dental work.  Surgery.  Stress.  Sudden changes in temperature.  Exercise.  In some cases, the cause of this condition is not known. What are the signs or symptoms? Symptoms of this condition depend on where the swelling happens. Symptoms may include:  Swollen skin.  Red, itchy patches of skin (hives).  Redness in the affected area.  Pain in the affected area.  Swollen lips or tongue.  Wheezing.  Breathing problems.  If your internal organs are involved, symptoms may also include:  Nausea.  Abdominal pain.  Vomiting.  Difficulty swallowing.  Difficulty passing urine.  How is this diagnosed? This condition may be diagnosed based on:  An exam of the affected area.  Your medical history.  Whether anyone in your family has had this condition before.  A review of any medicines you have been taking.  Tests, including: ? Allergy skin tests to see if the condition was caused by an allergic reaction. ? Blood tests to  see if the condition was caused by a gene. ? Tests to check for underlying diseases that could cause the condition.  How is this treated? Treatment for this condition depends on the cause. It may involve any of the following:  If something triggered the condition, making changes to keep it from triggering the condition again.  If the condition affects your breathing, having tubes placed in your airway to keep it open.  Taking medicines to treat symptoms or prevent future episodes. These may include: ? Antihistamines. ? Epinephrine injections. ? Steroids.  If your condition is severe, you may need to be treated at the hospital. Angioedema usually gets better in 24-48 hours. Follow these instructions at home:  Take over-the-counter and prescription medicines only as told by your health care provider.  If you were given medicines for emergency allergy treatment, always carry them with you.  Wear a medical bracelet as told by your health care provider.  If something triggers your condition, avoid the trigger, if possible.  If your condition is inherited and you are thinking about having children, talk to your health care provider. It is important to discuss the risks of passing on the condition to your children. Contact a health care provider if:  You have repeated episodes of angioedema.  Episodes of angioedema start to happen more often than they used to, even after you take steps to prevent them.  You have episodes of angioedema that are more severe than they have been before, even after you take steps to prevent them.  You are thinking about having children.   severe swelling of your mouth, tongue, or lips.  You have trouble breathing.  You have trouble swallowing.  You faint. This information is not intended to replace advice given to you by your health care provider. Make sure you discuss any questions you have with your health care provider. Document  Released: 08/19/2001 Document Revised: 01/06/2016 Document Reviewed: 12/19/2015 Elsevier Interactive Patient Education  2017 [employer].

## 2016-09-02 NOTE — Progress Notes (Signed)
Subjective:  Patient ID: Melanie Garrett, female    DOB: [birth year]  Age: 61 y.o. MRN: [id number]  CC: Annual Exam and Rash  She complains of a one week hx of intensely itchy whelps over her torso that come and go intermittently- she has gotten minimal symptom relief with Benadryl. She denies any systemic symptoms such as abdominal pain, cough, wheezing, sore throat, lymphadenopathy, night sweats, fever, or chills. She is not taking any over-the-counter medications, she is not taking any supplements, has not recently changed her food intake, and she tells me she does not drink alcohol.  HPI Melanie Garrett presents for a CPX.  Outpatient Medications Prior to Visit  Medication Sig Dispense Refill  . azithromycin (ZITHROMAX) 500 MG tablet Take 1 tablet (500 mg total) by mouth daily. 3 tablet 0  . fluocinonide-emollient (LIDEX-E) 0.05 % cream Apply 1 application topically 2 (two) times daily. 60 g 1  . HYDROcodone-homatropine (HYCODAN) 5-1.5 MG/5ML syrup Take 5 mLs by mouth every 8 (eight) hours as needed for cough. 120 mL 0  . hydrOXYzine (ATARAX/VISTARIL) 25 MG tablet Take 1 tablet (25 mg total) by mouth 3 (three) times daily as needed. 90 tablet 1  . ibuprofen (ADVIL,MOTRIN) 400 MG tablet Take 1 tablet (400 mg total) by mouth every 6 (six) hours as needed. 30 tablet 0  . methocarbamol (ROBAXIN) 500 MG tablet Take 1 tablet (500 mg total) by mouth 2 (two) times daily. 20 tablet 0   No facility-administered medications prior to visit.     ROS Review of Systems  Constitutional: Negative.  Negative for activity change, appetite change, chills, diaphoresis, fatigue, fever and unexpected weight change.  HENT: Negative.  Negative for facial swelling, rhinorrhea, sinus pressure, sore throat, trouble swallowing and voice change.   Eyes: Negative.   Respiratory: Negative.  Negative for cough, chest tightness, shortness of breath, wheezing and stridor.   Cardiovascular: Negative for chest pain,  palpitations and leg swelling.  Gastrointestinal: Negative for abdominal pain, constipation, diarrhea, nausea and vomiting.  Endocrine: Negative.   Genitourinary: Negative.  Negative for decreased urine volume, difficulty urinating, dysuria, frequency, hematuria and urgency.  Musculoskeletal: Negative.  Negative for arthralgias, back pain, myalgias and neck pain.  Skin: Positive for rash. Negative for pallor and wound.  Allergic/Immunologic: Negative.   Neurological: Negative.  Negative for dizziness and weakness.  Hematological: Negative.  Negative for adenopathy. Does not bruise/bleed easily.  Psychiatric/Behavioral: Negative.     Objective:  BP 110/70 (BP Location: Left Arm, Patient Position: Sitting, Cuff Size: Large)   Pulse 91   Temp 98.7 F (37.1 C) (Oral)   Ht 5\' 3"  (1.6 m)   Wt 187 lb (84.8 kg)   SpO2 97%   BMI 33.13 kg/m   BP Readings from Last 3 Encounters:  09/02/16 110/70  05/19/16 116/75  06/01/15 128/82    Wt Readings from Last 3 Encounters:  09/02/16 187 lb (84.8 kg)  06/01/15 188 lb 4 oz (85.4 kg)  02/28/15 186 lb (84.4 kg)    Physical Exam  Constitutional: She is oriented to person, place, and time. No distress.  HENT:  Mouth/Throat: Oropharynx is clear and moist. No oropharyngeal exudate.  Eyes: Conjunctivae are normal. Right eye exhibits no discharge. Left eye exhibits no discharge. No scleral icterus.  Neck: Normal range of motion. Neck supple. No JVD present. No tracheal deviation present. No thyromegaly present.  Cardiovascular: Normal rate, regular rhythm, normal heart sounds and intact distal pulses.  Exam reveals no gallop and no friction  rub.   No murmur heard. Pulmonary/Chest: Effort normal and breath sounds normal. No stridor. No respiratory distress. She has no wheezes. She has no rales. She exhibits no tenderness.  Abdominal: Soft. Bowel sounds are normal. She exhibits no distension and no mass. There is no tenderness. There is no rebound and  no guarding. Hernia confirmed negative in the right inguinal area and confirmed negative in the left inguinal area.  Genitourinary: Rectum normal and vagina normal. Rectal exam shows no external hemorrhoid, no internal hemorrhoid, no fissure, no mass, no tenderness, anal tone normal and guaiac negative stool. No breast swelling, tenderness, discharge or bleeding. Pelvic exam was performed with patient supine. No labial fusion. There is no rash, tenderness, lesion or injury on the right labia. There is no rash, tenderness, lesion or injury on the left labia. Uterus is not deviated, not enlarged, not fixed and not tender. Cervix exhibits no motion tenderness, no discharge and no friability. Right adnexum displays no mass, no tenderness and no fullness. Left adnexum displays no mass, no tenderness and no fullness. No erythema, tenderness or bleeding in the vagina. No foreign body in the vagina. No signs of injury around the vagina. No vaginal discharge found.  Musculoskeletal: Normal range of motion. She exhibits no edema, tenderness or deformity.  Lymphadenopathy:    She has no cervical adenopathy.       Right: No inguinal adenopathy present.       Left: No inguinal adenopathy present.  Neurological: She is oriented to person, place, and time.  Skin: Skin is warm and dry. Rash noted. No ecchymosis, no petechiae and no purpura noted. Rash is urticarial. Rash is not macular, not papular, not maculopapular, not nodular, not pustular and not vesicular. She is not diaphoretic. No cyanosis or erythema. No pallor. Nails show no clubbing.  There are scattered groups of wheals over her torso  Psychiatric: She has a normal mood and affect. Her behavior is normal. Judgment and thought content normal.  Vitals reviewed.   Lab Results  Component Value Date   WBC 5.7 04/27/2010   HGB 12.3 04/27/2010   HCT 35.4 (L) 04/27/2010   PLT 249.0 04/27/2010   GLUCOSE 87 04/27/2010   CHOL 193 04/27/2010   TRIG 66.0  04/27/2010   HDL 47.60 04/27/2010   LDLCALC 132 (H) 04/27/2010   ALT 19 04/27/2010   AST 24 04/27/2010   NA 139 04/27/2010   K 4.2 04/27/2010   CL 104 04/27/2010   CREATININE 0.7 04/27/2010   BUN 11 04/27/2010   CO2 29 04/27/2010   TSH 2.21 04/27/2010    No results found.  Assessment & Plan:   Melanie Garrett was seen today for annual exam and rash.  Diagnoses and all orders for this visit:  Urticaria, idiopathic- she has no symptoms of systemic illness, will check her labs to screen for blood dyscrasia, vasculitis, thyroid disease, hepatitis, renal disorders. For now will treat with a potent antihistamine for symptom relief. She was given patient education material regarding urticaria. -     Sedimentation rate; Future -     hydrOXYzine (ATARAX/VISTARIL) 25 MG tablet; Take 1 tablet (25 mg total) by mouth 3 (three) times daily as needed.  Routine general medical examination at a health care facility- exam completed, labs ordered and will be reviewed, her mammogram and colonoscopy are up-to-date, Pap collected and sent, patient education material was given. -     Lipid panel; Future -     Comprehensive metabolic panel; Future -  CBC with Differential/Platelet; Future -     TSH; Future  Need for hepatitis C screening test -     Hepatitis C antibody; Future  Eczema, dyshidrotic  Cervical cancer screening -     Cytology - PAP   I have discontinued Melanie Garrett's fluocinonide-emollient, azithromycin, HYDROcodone-homatropine, methocarbamol, and ibuprofen. I am also having her maintain her hydrOXYzine.  Meds ordered this encounter  Medications  . hydrOXYzine (ATARAX/VISTARIL) 25 MG tablet    Sig: Take 1 tablet (25 mg total) by mouth 3 (three) times daily as needed.    Dispense:  90 tablet    Refill:  1     Follow-up: Return in about 3 weeks (around 09/23/2016).  Melanie Calico, MD

## 2016-09-03 ENCOUNTER — Encounter: Payer: Self-pay | Admitting: Internal Medicine

## 2016-09-03 LAB — TSH: TSH: 2.35 u[IU]/mL (ref 0.35–4.50)

## 2016-09-04 LAB — CYTOLOGY - PAP
Diagnosis: NEGATIVE
HPV: NOT DETECTED

## 2016-09-04 LAB — HM PAP SMEAR

## 2016-11-09 IMAGING — DX DG CHEST 2V
2 series · 2 of 2 positions shown · non-contrast
Comparison: No prior.

CLINICAL DATA: Cough and congestion.

EXAM:
CHEST  2 VIEW

[chest pa]
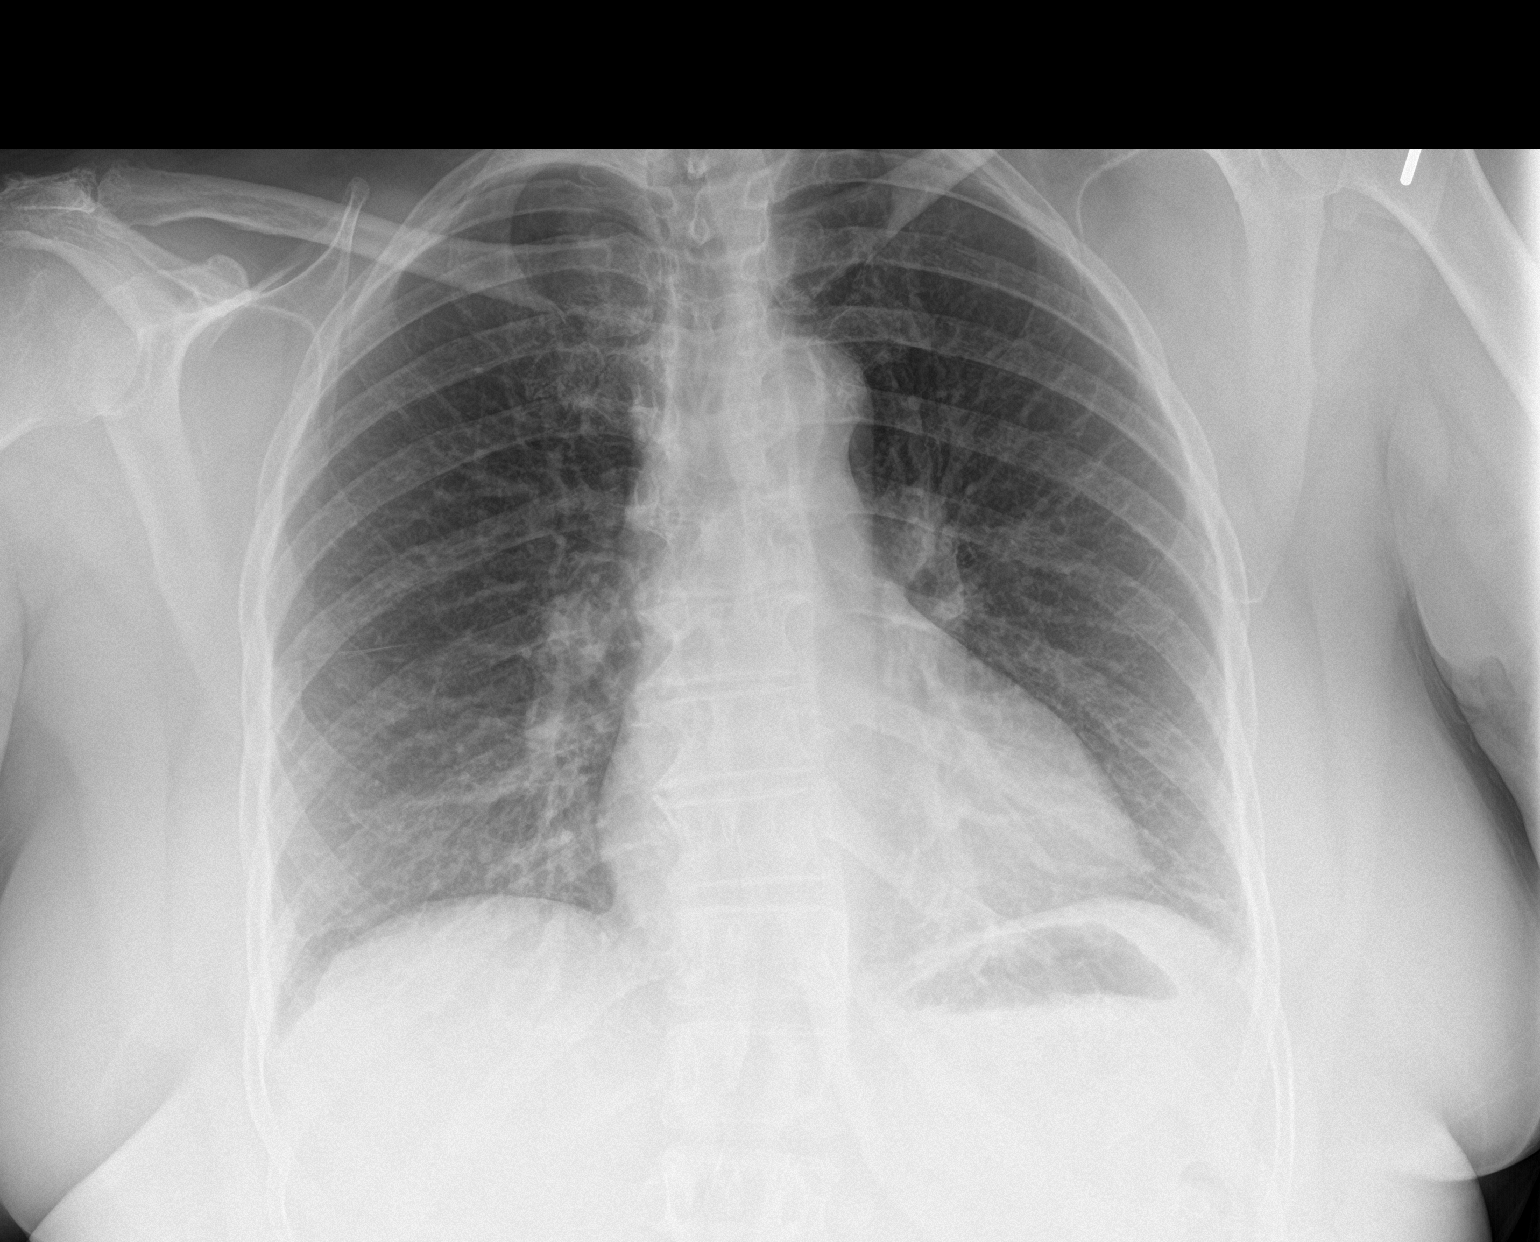

[chest lat]
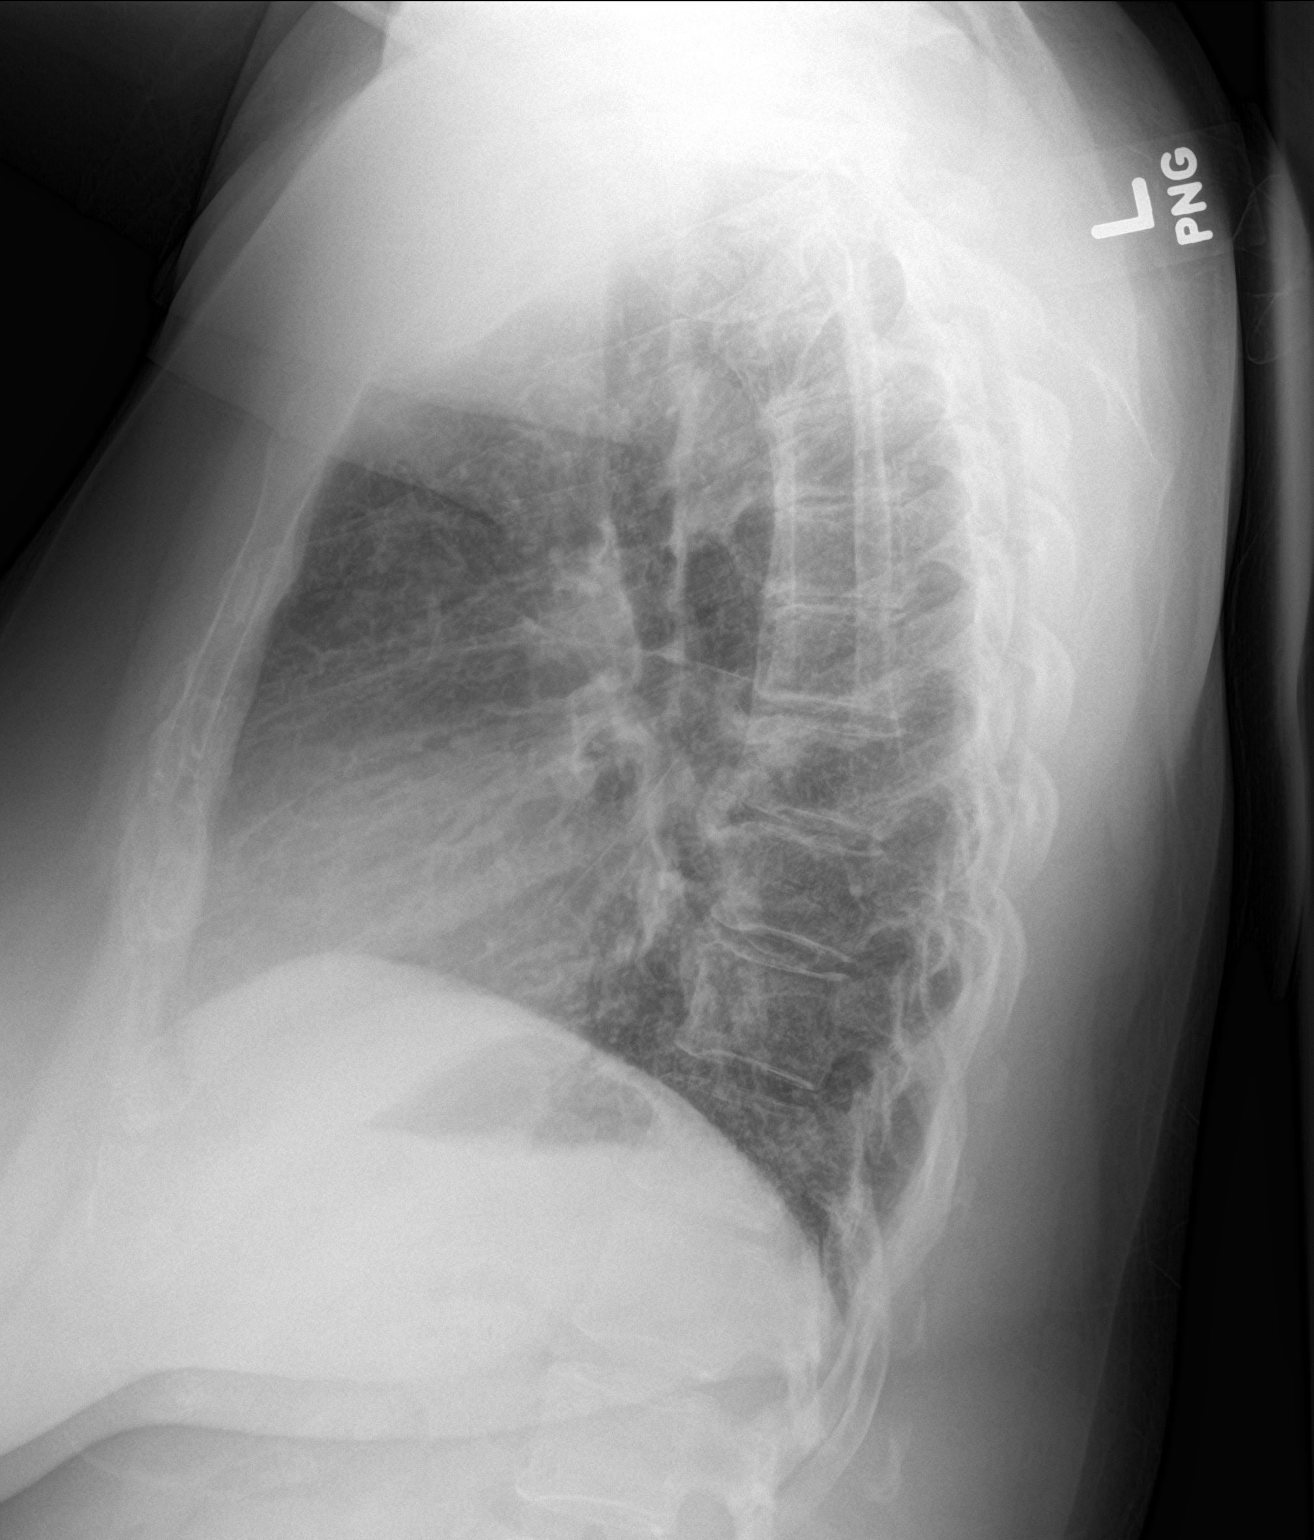

[2 of 2 positions shown; findings below may reference images not displayed]

FINDINGS: Low lung volumes with mild bibasilar subsegmental atelectasis. Will
mild cardiomegaly. No pulmonary venous congestion. No pleural
effusion or pneumothorax.
IMPRESSION: Low lung volumes with mild bibasilar subsegmental atelectasis.

## 2017-08-27 NOTE — Progress Notes (Signed)
Subjective:    Patient ID: Melanie Garrett, female    DOB: 16-Mar-1956, 62 y.o.   MRN: [id number]  HPI The patient is here for an acute visit.   Back pain;  It started about one month ago.  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  She has had pain in the past and did see an orthopedic.  Her current pain is across her lower back.  She denies any radiation, numbness/tingling or weakness in her legs.  The pain is worse with activity and walking.  She denies any fevers, changes in bowel or bladder.  She was not able to go to work a couple of days last week and has not gone today.  She thinks all of her symptoms started after her schedule changed and she now works 5 days in a row.  Previously she would have a day off intermittently and now she does not have any breaks.  She has not taken anything for it.   Lumbar Xray 07/01/14: EXAM: LUMBAR SPINE - COMPLETE 4+ VIEW  COMPARISON:  None.  FINDINGS: There are 5 nonrib bearing lumbar-type vertebral bodies.  The vertebral body heights are maintained.  There is grade 1 anterolisthesis of L3 on L4 likely secondary to facet disease. There is no spondylolysis.  There is no acute fracture.  There is degenerative disc disease at L3-4.  The SI joints are unremarkable.  IMPRESSION: No acute osseous injury of the lumbar spine.  Mild lumbar spine spondylosis at L3-4.     Medications and allergies reviewed with patient and updated if appropriate.  Patient Active Problem List   Diagnosis Date Noted  . 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 Routine general medical examination at a health care facility 09/02/2016  . Need for hepatitis C screening test 09/02/2016  . Eczema, dyshidrotic 02/02/2015  . Acute midline low back pain without sciatica 07/01/2014    Current Outpatient Medications on File Prior to Visit  Medication Sig Dispense Refill  . hydrOXYzine  (ATARAX/VISTARIL) 25 MG tablet Take 1 tablet (25 mg total) by mouth 3 (three) times daily as needed. 90 tablet 1   No current facility-administered medications on file prior to visit.     No past medical history on file.  No past surgical history on file.  Social History   Socioeconomic History  . Marital status: Married    Spouse name: None  . Number of children: None  . Years of education: None  . Highest education level: None  Social Needs  . Financial resource strain: None  . Food insecurity - worry: None  . Food insecurity - inability: None  . Transportation needs - medical: None  . Transportation needs - non-medical: None  Occupational History  . None  Tobacco Use  . Smoking status: Never Smoker  . Smokeless tobacco: Never Used  Substance and Sexual Activity  . Alcohol use: No  . Drug use: No  . Sexual activity: Yes    Birth control/protection: Post-menopausal  Other Topics Concern  . None  Social History Narrative  . None    Family History  Problem Relation Age of Onset  . Arthritis Mother   . Alcohol abuse Neg Hx   . Cancer Neg Hx   . Diabetes Neg Hx   . Drug abuse Neg Hx   . Early death Neg Hx   . Heart disease Neg Hx   . Stroke Neg Hx  Review of Systems  Constitutional: Negative for chills and fever.  Gastrointestinal:       No changes in bowel habits  Genitourinary:       No incontinence or changes in urination  Musculoskeletal: Positive for back pain. Negative for myalgias.  Neurological: Negative for weakness and numbness.       Objective:   Vitals:   08/28/17 0923  BP: 126/72  Pulse: 82  Resp: 16  Temp: 97.7 F (36.5 C)  SpO2: 98%   Wt Readings from Last 3 Encounters:  08/28/17 185 lb (83.9 kg)  09/02/16 187 lb (84.8 kg)  06/01/15 188 lb 4 oz (85.4 kg)   Body mass index is 32.77 kg/m.   Physical Exam  Constitutional: She appears well-developed and well-nourished. No distress.  HENT:  Head: Normocephalic and  atraumatic.  Musculoskeletal: She exhibits no edema.  Mild tenderness across lower back with palpation, no lumbar deformity, no pain with palpation of SI joints  Neurological: She exhibits normal muscle tone. Coordination normal.  Normal sensation and strength bilateral lower extremities, negative straight leg raise bilaterally  Skin: Skin is warm and dry. She is not diaphoretic.           Assessment & Plan:    See Problem List for Assessment and Plan of chronic medical problems.

## 2017-08-28 ENCOUNTER — Ambulatory Visit (INDEPENDENT_AMBULATORY_CARE_PROVIDER_SITE_OTHER): Payer: BLUE CROSS/BLUE SHIELD | Admitting: Internal Medicine

## 2017-08-28 ENCOUNTER — Encounter: Payer: Self-pay | Admitting: Internal Medicine

## 2017-08-28 VITALS — BP 126/72 | HR 82 | Temp 97.7°F | Resp 16 | Wt 185.0 lb

## 2017-08-28 DIAGNOSIS — M545 Low back pain, unspecified: Secondary | ICD-10-CM

## 2017-08-28 MED ORDER — MELOXICAM 15 MG PO TABS: 15.0000 mg | ORAL_TABLET | Freq: Every day | ORAL | 0 refills | Status: DC

## 2017-08-28 NOTE — Patient Instructions (Addendum)
Take the meloxicam daily as prescribed with food.    A note given for work.   If your pain does not improve see your orthopedic or follow up with Dr Ronnald Ramp.      Back Exercises The following exercises strengthen the muscles that help to support the back. They also help to keep the lower back flexible. Doing these exercises can help to prevent back pain or lessen existing pain. If you have back pain or discomfort, try doing these exercises 2-3 times each day or as told by your health care provider. When the pain goes away, do them once each day, but increase the number of times that you repeat the steps for each exercise (do more repetitions). If you do not have back pain or discomfort, do these exercises once each day or as told by your health care provider. Exercises Single Knee to Chest  Repeat these steps 3-5 times for each leg: 1. Lie on your back on a firm bed or the floor with your legs extended. 2. Bring one knee to your chest. Your other leg should stay extended and in contact with the floor. 3. Hold your knee in place by grabbing your knee or thigh. 4. Pull on your knee until you feel a gentle stretch in your lower back. 5. Hold the stretch for 10-30 seconds. 6. Slowly release and straighten your leg.  Pelvic Tilt  Repeat these steps 5-10 times: 1. Lie on your back on a firm bed or the floor with your legs extended. 2. Bend your knees so they are pointing toward the ceiling and your feet are flat on the floor. 3. Tighten your lower abdominal muscles to press your lower back against the floor. This motion will tilt your pelvis so your tailbone points up toward the ceiling instead of pointing to your feet or the floor. 4. With gentle tension and even breathing, hold this position for 5-10 seconds.  Cat-Cow  Repeat these steps until your lower back becomes more flexible: 1. Get into a hands-and-knees position on a firm surface. Keep your hands under your shoulders, and keep  your knees under your hips. You may place padding under your knees for comfort. 2. Let your head hang down, and point your tailbone toward the floor so your lower back becomes rounded like the back of a cat. 3. Hold this position for 5 seconds. 4. Slowly lift your head and point your tailbone up toward the ceiling so your back forms a sagging arch like the back of a cow. 5. Hold this position for 5 seconds.  Press-Ups  Repeat these steps 5-10 times: 1. Lie on your abdomen (face-down) on the floor. 2. Place your palms near your head, about shoulder-width apart. 3. While you keep your back as relaxed as possible and keep your hips on the floor, slowly straighten your arms to raise the top half of your body and lift your shoulders. Do not use your back muscles to raise your upper torso. You may adjust the placement of your hands to make yourself more comfortable. 4. Hold this position for 5 seconds while you keep your back relaxed. 5. Slowly return to lying flat on the floor.  Bridges  Repeat these steps 10 times: 1. Lie on your back on a firm surface. 2. Bend your knees so they are pointing toward the ceiling and your feet are flat on the floor. 3. Tighten your buttocks muscles and lift your buttocks off of the floor until your waist  is at almost the same height as your knees. You should feel the muscles working in your buttocks and the back of your thighs. If you do not feel these muscles, slide your feet 1-2 inches farther away from your buttocks. 4. Hold this position for 3-5 seconds. 5. Slowly lower your hips to the starting position, and allow your buttocks muscles to relax completely.  If this exercise is too easy, try doing it with your arms crossed over your chest. Abdominal Crunches  Repeat these steps 5-10 times: 1. Lie on your back on a firm bed or the floor with your legs extended. 2. Bend your knees so they are pointing toward the ceiling and your feet are flat on the  floor. 3. Cross your arms over your chest. 4. Tip your chin slightly toward your chest without bending your neck. 5. Tighten your abdominal muscles and slowly raise your trunk (torso) high enough to lift your shoulder blades a tiny bit off of the floor. Avoid raising your torso higher than that, because it can put too much stress on your low back and it does not help to strengthen your abdominal muscles. 6. Slowly return to your starting position.  Back Lifts Repeat these steps 5-10 times: 1. Lie on your abdomen (face-down) with your arms at your sides, and rest your forehead on the floor. 2. Tighten the muscles in your legs and your buttocks. 3. Slowly lift your chest off of the floor while you keep your hips pressed to the floor. Keep the back of your head in line with the curve in your back. Your eyes should be looking at the floor. 4. Hold this position for 3-5 seconds. 5. Slowly return to your starting position.  Contact a health care provider if:  Your back pain or discomfort gets much worse when you do an exercise.  Your back pain or discomfort does not lessen within 2 hours after you exercise. If you have any of these problems, stop doing these exercises right away. Do not do them again unless your health care provider says that you can. Get help right away if:  You develop sudden, severe back pain. If this happens, stop doing the exercises right away. Do not do them again unless your health care provider says that you can. This information is not intended to replace advice given to you by your health care provider. Make sure you discuss any questions you have with your health care provider. Document Released: 07/18/2004 Document Revised: 10/18/2015 Document Reviewed: 08/04/2014 Elsevier Interactive Patient Education  2017 [employer].

## 2017-08-28 NOTE — Assessment & Plan Note (Signed)
Lower back pain x 1 month Likely related to her work - dose a lot of lifting at work - pain started after working 5 days in a row w/o breaks Had xray a few years ago - no need for repeat imaging today Start home exercises - deferred PT meloxicam 15 mg daily F/u if no improvement

## 2017-11-04 ENCOUNTER — Other Ambulatory Visit: Payer: Self-pay | Admitting: Internal Medicine

## 2017-11-15 ENCOUNTER — Other Ambulatory Visit: Payer: Self-pay | Admitting: Internal Medicine

## 2018-06-01 LAB — HM MAMMOGRAPHY

## 2018-06-29 ENCOUNTER — Other Ambulatory Visit (INDEPENDENT_AMBULATORY_CARE_PROVIDER_SITE_OTHER): Payer: PRIVATE HEALTH INSURANCE

## 2018-06-29 ENCOUNTER — Ambulatory Visit (INDEPENDENT_AMBULATORY_CARE_PROVIDER_SITE_OTHER): Payer: PRIVATE HEALTH INSURANCE | Admitting: Internal Medicine

## 2018-06-29 ENCOUNTER — Encounter: Payer: Self-pay | Admitting: Internal Medicine

## 2018-06-29 VITALS — BP 134/84 | HR 73 | Temp 98.2°F | Ht 63.0 in | Wt 188.5 lb

## 2018-06-29 DIAGNOSIS — Z0000 Encounter for general adult medical examination without abnormal findings: Secondary | ICD-10-CM

## 2018-06-29 DIAGNOSIS — E669 Obesity, unspecified: Secondary | ICD-10-CM

## 2018-06-29 DIAGNOSIS — M545 Low back pain, unspecified: Secondary | ICD-10-CM

## 2018-06-29 DIAGNOSIS — Z23 Encounter for immunization: Secondary | ICD-10-CM

## 2018-06-29 DIAGNOSIS — G8929 Other chronic pain: Secondary | ICD-10-CM

## 2018-06-29 LAB — COMPREHENSIVE METABOLIC PANEL
ALT: 17 U/L (ref 0–35)
AST: 22 U/L (ref 0–37)
Albumin: 3.7 g/dL (ref 3.5–5.2)
Alkaline Phosphatase: 65 U/L (ref 39–117)
BUN: 13 mg/dL (ref 6–23)
CHLORIDE: 105 meq/L (ref 96–112)
CO2: 28 mEq/L (ref 19–32)
Calcium: 9.2 mg/dL (ref 8.4–10.5)
Creatinine, Ser: 0.63 mg/dL (ref 0.40–1.20)
GFR: 123.12 mL/min (ref >60.00)
Glucose, Bld: 87 mg/dL (ref 70–99)
Potassium: 4 mEq/L (ref 3.5–5.1)
Sodium: 139 mEq/L (ref 135–145)
Total Bilirubin: 0.4 mg/dL (ref 0.2–1.2)
Total Protein: 7.4 g/dL (ref 6.0–8.3)

## 2018-06-29 LAB — LIPID PANEL
Cholesterol: 184 mg/dL (ref 0–200)
HDL: 45.4 mg/dL (ref >39.00)
LDL CALC: 121 mg/dL — AB (ref 0–99)
NonHDL: 138.22
Total CHOL/HDL Ratio: 4
Triglycerides: 84 mg/dL (ref 0.0–149.0)
VLDL: 16.8 mg/dL (ref 0.0–40.0)

## 2018-06-29 LAB — CBC WITH DIFFERENTIAL/PLATELET
Basophils Absolute: 0.1 10*3/uL (ref 0.0–0.1)
Basophils Relative: 1.4 % (ref 0.0–3.0)
Eosinophils Absolute: 0.2 10*3/uL (ref 0.0–0.7)
Eosinophils Relative: 3.2 % (ref 0.0–5.0)
HCT: 36 % (ref 36.0–46.0)
HEMOGLOBIN: 12.4 g/dL (ref 12.0–15.0)
LYMPHS PCT: 48.3 % — AB (ref 12.0–46.0)
Lymphs Abs: 2.3 10*3/uL (ref 0.7–4.0)
MCHC: 34.5 g/dL (ref 30.0–36.0)
MCV: 85 fl (ref 78.0–100.0)
Monocytes Absolute: 0.5 10*3/uL (ref 0.1–1.0)
Monocytes Relative: 10 % (ref 3.0–12.0)
Neutro Abs: 1.8 10*3/uL (ref 1.4–7.7)
Neutrophils Relative %: 37.1 % — ABNORMAL LOW (ref 43.0–77.0)
Platelets: 224 10*3/uL (ref 150.0–400.0)
RBC: 4.23 Mil/uL (ref 3.87–5.11)
RDW: 13.5 % (ref 11.5–15.5)
WBC: 4.8 10*3/uL (ref 4.0–10.5)

## 2018-06-29 LAB — HEMOGLOBIN A1C: Hgb A1c MFr Bld: 5.7 % (ref 4.6–6.5)

## 2018-06-29 LAB — TSH: TSH: 2.77 u[IU]/mL (ref 0.35–4.50)

## 2018-06-29 MED ORDER — MELOXICAM 15 MG PO TABS: 15.0000 mg | ORAL_TABLET | Freq: Every day | ORAL | 0 refills | Status: DC

## 2018-06-29 NOTE — Patient Instructions (Signed)

## 2018-06-29 NOTE — Progress Notes (Signed)
Subjective:  Patient ID: Melanie Garrett, female    DOB: [birth year]  Age: 63 y.o. MRN: [id number]  CC: Annual Exam and Obesity   HPI Melanie Garrett presents for a CPX.  She complains of chronic, mild, intermittent nonradiating low back pain.  She requests a refill on meloxicam which helps control the pain.  She denies paresthesias.  Outpatient Medications Prior to Visit  Medication Sig Dispense Refill  . hydrOXYzine (ATARAX/VISTARIL) 25 MG tablet Take 1 tablet (25 mg total) by mouth 3 (three) times daily as needed. 90 tablet 1  . meloxicam (MOBIC) 15 MG tablet TAKE 1 TABLET BY MOUTH ONCE DAILY 30 tablet 0   No facility-administered medications prior to visit.     ROS Review of Systems  Constitutional: Positive for unexpected weight change (wt gain). Negative for diaphoresis and fatigue.  HENT: Negative.   Eyes: Negative.   Respiratory: Negative for cough, chest tightness, shortness of breath and wheezing.   Cardiovascular: Negative for chest pain, palpitations and leg swelling.  Gastrointestinal: Negative for abdominal pain, constipation, diarrhea, nausea and vomiting.  Genitourinary: Negative.  Negative for difficulty urinating.  Musculoskeletal: Positive for back pain. Negative for myalgias and neck pain.  Skin: Negative.  Negative for color change, pallor and rash.  Neurological: Negative.  Negative for dizziness, weakness and numbness.  Hematological: Negative for adenopathy. Does not bruise/bleed easily.  Psychiatric/Behavioral: Negative.     Objective:  BP 134/84 (BP Location: Left Arm, Patient Position: Sitting, Cuff Size: Large)   Pulse 73   Temp 98.2 F (36.8 C) (Oral)   Ht 5\' 3"  (1.6 m)   Wt 188 lb 8 oz (85.5 kg)   SpO2 99%   BMI 33.39 kg/m   BP Readings from Last 3 Encounters:  06/29/18 134/84  08/28/17 126/72  09/02/16 110/70    Wt Readings from Last 3 Encounters:  06/29/18 188 lb 8 oz (85.5 kg)  08/28/17 185 lb (83.9 kg)  09/02/16 187 lb (84.8 kg)     Physical Exam Vitals signs reviewed.  Constitutional:      Appearance: She is obese.  HENT:     Nose: Nose normal. No congestion or rhinorrhea.     Mouth/Throat:     Pharynx: Oropharynx is clear. No oropharyngeal exudate or posterior oropharyngeal erythema.  Eyes:     General: No scleral icterus.    Conjunctiva/sclera: Conjunctivae normal.  Neck:     Musculoskeletal: Normal range of motion and neck supple.  Cardiovascular:     Rate and Rhythm: Normal rate and regular rhythm.     Heart sounds: No murmur. No gallop.   Pulmonary:     Effort: Pulmonary effort is normal.     Breath sounds: No stridor. No wheezing, rhonchi or rales.  Abdominal:     General: Abdomen is flat. Bowel sounds are normal.     Palpations: There is no mass.     Tenderness: There is no abdominal tenderness. There is no guarding.     Hernia: No hernia is present.  Musculoskeletal: Normal range of motion.        General: No swelling.     Lumbar back: Normal. She exhibits normal range of motion, no tenderness, no bony tenderness, no swelling, no edema and no deformity.     Right lower leg: No edema.     Left lower leg: No edema.  Skin:    General: Skin is warm and dry.  Neurological:     General: No focal deficit present.  Mental Status: She is alert and oriented to person, place, and time. Mental status is at baseline.     Sensory: Sensation is intact.     Motor: Motor function is intact. No weakness.     Coordination: Coordination is intact. Romberg sign negative. Coordination normal. Heel to Shin Test normal.     Deep Tendon Reflexes: Reflexes are normal and symmetric.     Comments: Neg SLR in BLE     Lab Results  Component Value Date   WBC 4.8 06/29/2018   HGB 12.4 06/29/2018   HCT 36.0 06/29/2018   PLT 224.0 06/29/2018   GLUCOSE 87 06/29/2018   CHOL 184 06/29/2018   TRIG 84.0 06/29/2018   HDL 45.40 06/29/2018   LDLCALC 121 (H) 06/29/2018   ALT 17 06/29/2018   AST 22 06/29/2018   NA  139 06/29/2018   K 4.0 06/29/2018   CL 105 06/29/2018   CREATININE 0.63 06/29/2018   BUN 13 06/29/2018   CO2 28 06/29/2018   TSH 2.77 06/29/2018   HGBA1C 5.7 06/29/2018    No results found.  Assessment & Plan:   Melanie Garrett was seen today for annual exam and obesity.  Diagnoses and all orders for this visit:  Routine general medical examination at a health care facility- Exam completed, labs reviewed, vaccines reviewed and updated, colon cancer screening/Pap/mammogram are all up-to-date.  Patient education material was given. -     Lipid panel; Future -     HIV Antibody (routine testing w rflx); Future  Obesity (BMI 30.0-34.9)- Labs are negative for secondary causes or complications of obesity.  She was encouraged to improve her lifestyle modifications to lose weight. -     CBC with Differential/Platelet; Future -     Comprehensive metabolic panel; Future -     TSH; Future -     Hemoglobin A1c; Future  Chronic midline low back pain, unspecified whether sciatica present -     meloxicam (MOBIC) 15 MG tablet; Take 1 tablet (15 mg total) by mouth daily.  Need for influenza vaccination -     Flu Vaccine QUAD 36+ mos IM   I have changed Melanie Garrett meloxicam. I am also having her maintain her hydrOXYzine.  Meds ordered this encounter  Medications  . meloxicam (MOBIC) 15 MG tablet    Sig: Take 1 tablet (15 mg total) by mouth daily.    Dispense:  90 tablet    Refill:  0    Please consider 90 day supplies to promote better adherence     Follow-up: Return in about 1 year (around 06/30/2019).  Melanie Calico, MD

## 2018-06-30 LAB — HIV ANTIBODY (ROUTINE TESTING W REFLEX): HIV 1&2 Ab, 4th Generation: NONREACTIVE

## 2020-01-20 ENCOUNTER — Ambulatory Visit (INDEPENDENT_AMBULATORY_CARE_PROVIDER_SITE_OTHER): Payer: PRIVATE HEALTH INSURANCE

## 2020-01-20 ENCOUNTER — Ambulatory Visit: Payer: PRIVATE HEALTH INSURANCE | Admitting: Podiatry

## 2020-01-20 ENCOUNTER — Encounter: Payer: Self-pay | Admitting: Podiatry

## 2020-01-20 DIAGNOSIS — M79671 Pain in right foot: Secondary | ICD-10-CM | POA: Diagnosis not present

## 2020-01-20 DIAGNOSIS — M79672 Pain in left foot: Secondary | ICD-10-CM | POA: Diagnosis not present

## 2020-01-20 DIAGNOSIS — M779 Enthesopathy, unspecified: Secondary | ICD-10-CM | POA: Diagnosis not present

## 2020-01-20 DIAGNOSIS — M7672 Peroneal tendinitis, left leg: Secondary | ICD-10-CM

## 2020-01-20 MED ORDER — DICLOFENAC SODIUM 75 MG PO TBEC: 75.0000 mg | DELAYED_RELEASE_TABLET | Freq: Two times a day (BID) | ORAL | 2 refills | Status: DC

## 2020-01-20 NOTE — Progress Notes (Signed)
Subjective:   Patient ID: Melanie Garrett, female   DOB: 64 y.o.   MRN: [id number]   HPI Patient presents stating she has been getting pain in the outside of both feet with the left hurting more.  Patient states it is hurting when she is stationary and then she gets back up she does not remember an injury and its been recent but very tender.  Patient does not smoke likes to be active   Review of Systems  All other systems reviewed and are negative.       Objective:  Physical Exam Vitals and nursing note reviewed.  Constitutional:      Appearance: She is well-developed.  Pulmonary:     Effort: Pulmonary effort is normal.  Musculoskeletal:        General: Normal range of motion.  Skin:    General: Skin is warm.  Neurological:     Mental Status: She is alert.     Neurovascular status intact muscle strength was found to be adequate range of motion within normal limits.  Patient is found to have quite a bit of discomfort in the peroneal group left near its insertion and slightly proximal to this area.  I did not note muscle strength loss or damage and patient was found to have good digital perfusion well oriented x3     Assessment:  Peroneal tendinitis left with inflammation over right     Plan:  H&P x-rays reviewed and today operative focus on the left I did sterile prep injected the peroneal insertion 3 mg dexamethasone Kenalog 5 mg Xylocaine advised on reduced activity ice therapy and reappoint for [place] to recheck as needed.  Also placed on diclofenac 75 mg twice daily and diclofenac gel  X-rays indicate there is no signs of fracture or arthritis associated with this condition

## 2020-01-21 ENCOUNTER — Other Ambulatory Visit: Payer: Self-pay | Admitting: Podiatry

## 2020-01-21 DIAGNOSIS — M779 Enthesopathy, unspecified: Secondary | ICD-10-CM

## 2020-01-21 DIAGNOSIS — M7670 Peroneal tendinitis, unspecified leg: Secondary | ICD-10-CM

## 2020-02-09 ENCOUNTER — Other Ambulatory Visit: Payer: Self-pay

## 2020-02-09 ENCOUNTER — Ambulatory Visit (INDEPENDENT_AMBULATORY_CARE_PROVIDER_SITE_OTHER): Payer: PRIVATE HEALTH INSURANCE | Admitting: Internal Medicine

## 2020-02-09 ENCOUNTER — Encounter: Payer: Self-pay | Admitting: Internal Medicine

## 2020-02-09 ENCOUNTER — Ambulatory Visit (INDEPENDENT_AMBULATORY_CARE_PROVIDER_SITE_OTHER): Payer: PRIVATE HEALTH INSURANCE

## 2020-02-09 VITALS — BP 118/66 | HR 72 | Temp 98.2°F | Resp 16 | Ht 63.0 in | Wt 182.2 lb

## 2020-02-09 DIAGNOSIS — M8949 Other hypertrophic osteoarthropathy, multiple sites: Secondary | ICD-10-CM | POA: Diagnosis not present

## 2020-02-09 DIAGNOSIS — M1990 Unspecified osteoarthritis, unspecified site: Secondary | ICD-10-CM

## 2020-02-09 DIAGNOSIS — M25512 Pain in left shoulder: Secondary | ICD-10-CM | POA: Diagnosis not present

## 2020-02-09 DIAGNOSIS — G8929 Other chronic pain: Secondary | ICD-10-CM

## 2020-02-09 DIAGNOSIS — M159 Polyosteoarthritis, unspecified: Secondary | ICD-10-CM

## 2020-02-09 HISTORY — DX: Unspecified osteoarthritis, unspecified site: M19.90

## 2020-02-09 MED ORDER — TRAMADOL HCL 50 MG PO TABS: 50.0000 mg | ORAL_TABLET | Freq: Four times a day (QID) | ORAL | 3 refills | Status: DC | PRN

## 2020-02-09 NOTE — Patient Instructions (Signed)

## 2020-02-09 NOTE — Progress Notes (Signed)
Subjective:  Patient ID: Melanie Garrett, female    DOB: Nov 18, 1955  Age: 64 y.o. MRN: [id number]  CC: Osteoarthritis  This visit occurred during the SARS-CoV-2 public health emergency.  Safety protocols were in place, including screening questions prior to the visit, additional usage of staff PPE, and extensive cleaning of exam room while observing appropriate contact time as indicated for disinfecting solutions.    HPI Melanie Garrett presents for f/up - She complains of worsening pain in her large joints.  She denies any recent injury or trauma.  Her most significant pain is in the left shoulder.  She has not gotten much symptom relief with diclofenac that was recently prescribed by a podiatrist.  She tells me the pain interferes with her sleep and activity.  She said none of her joints have been red or swollen.  Outpatient Medications Prior to Visit  Medication Sig Dispense Refill  . diclofenac (VOLTAREN) 75 MG EC tablet Take 1 tablet (75 mg total) by mouth 2 (two) times daily. 50 tablet 2   No facility-administered medications prior to visit.    ROS Review of Systems  Constitutional: Negative.   HENT: Negative.   Eyes: Negative.   Respiratory: Negative for cough, chest tightness, shortness of breath and wheezing.   Cardiovascular: Negative for chest pain, palpitations and leg swelling.  Gastrointestinal: Negative for abdominal pain, constipation, diarrhea, nausea and vomiting.  Genitourinary: Negative.  Negative for difficulty urinating.  Musculoskeletal: Positive for arthralgias. Negative for back pain, myalgias and neck pain.  Skin: Negative for color change, pallor and rash.  Neurological: Negative for dizziness, weakness, light-headedness, numbness and headaches.  Hematological: Negative for adenopathy. Does not bruise/bleed easily.  Psychiatric/Behavioral: Negative.     Objective:  BP 118/66 (BP Location: Left Arm, Patient Position: Sitting, Cuff Size: Large)   Pulse 72    Temp 98.2 F (36.8 C) (Oral)   Resp 16   Ht 5\' 3"  (1.6 m)   Wt 182 lb 4 oz (82.7 kg)   SpO2 98%   BMI 32.28 kg/m   BP Readings from Last 3 Encounters:  02/09/20 118/66  06/29/18 134/84  08/28/17 126/72    Wt Readings from Last 3 Encounters:  02/09/20 182 lb 4 oz (82.7 kg)  06/29/18 188 lb 8 oz (85.5 kg)  08/28/17 185 lb (83.9 kg)    Physical Exam Vitals reviewed.  Constitutional:      Appearance: Normal appearance.  HENT:     Nose: Nose normal.     Mouth/Throat:     Mouth: Mucous membranes are moist.  Eyes:     General: No scleral icterus.    Conjunctiva/sclera: Conjunctivae normal.  Cardiovascular:     Rate and Rhythm: Normal rate and regular rhythm.     Heart sounds: No murmur heard.   Pulmonary:     Effort: Pulmonary effort is normal.     Breath sounds: No stridor. No wheezing, rhonchi or rales.  Abdominal:     General: Abdomen is flat.     Palpations: There is no mass.     Tenderness: There is no abdominal tenderness.  Musculoskeletal:     Right shoulder: Normal. No swelling. Normal range of motion.     Left shoulder: No swelling, deformity, effusion, tenderness or bony tenderness. Decreased range of motion. Normal strength.     Cervical back: Neck supple.     Right lower leg: No edema.     Left lower leg: No edema.  Lymphadenopathy:     Cervical:  No cervical adenopathy.  Skin:    General: Skin is warm and dry.     Coloration: Skin is not pale.  Neurological:     Mental Status: She is alert.     Lab Results  Component Value Date   WBC 4.8 06/29/2018   HGB 12.4 06/29/2018   HCT 36.0 06/29/2018   PLT 224.0 06/29/2018   GLUCOSE 87 06/29/2018   CHOL 184 06/29/2018   TRIG 84.0 06/29/2018   HDL 45.40 06/29/2018   LDLCALC 121 (H) 06/29/2018   ALT 17 06/29/2018   AST 22 06/29/2018   NA 139 06/29/2018   K 4.0 06/29/2018   CL 105 06/29/2018   CREATININE 0.63 06/29/2018   BUN 13 06/29/2018   CO2 28 06/29/2018   TSH 2.77 06/29/2018   HGBA1C 5.7  06/29/2018   Narrative & Impression  CLINICAL DATA:  Chronic pain  EXAM: LEFT SHOULDER - 2+ VIEW  COMPARISON:  Chest radiograph 06/01/2015  FINDINGS: Moderate acromioclavicular and glenohumeral osteoarthrosis with periarticular spurring. No acute bony abnormality. Specifically, no fracture, subluxation, or dislocation. Included portions of the left chest wall and soft tissues of the left shoulder are unremarkable.  IMPRESSION: 1. Moderate acromioclavicular and glenohumeral osteoarthrosis. 2. No acute bony abnormality.   Electronically Signed   By: Lovena Le M.D.   On: 02/10/2020 03:57      Assessment & Plan:   Melanie Garrett was seen today for osteoarthritis.  Diagnoses and all orders for this visit:  Chronic left shoulder pain -     DG Shoulder Left; Future -     Ambulatory referral to Sports Medicine  Primary osteoarthritis involving multiple joints -     traMADol (ULTRAM) 50 MG tablet; Take 1 tablet (50 mg total) by mouth every 6 (six) hours as needed.   I have discontinued Melanie Garrett's diclofenac. I am also having her start on traMADol.  Meds ordered this encounter  Medications  . traMADol (ULTRAM) 50 MG tablet    Sig: Take 1 tablet (50 mg total) by mouth every 6 (six) hours as needed.    Dispense:  75 tablet    Refill:  3     Follow-up: Return in about 6 weeks (around 03/22/2020).  Melanie Calico, MD

## 2020-02-10 ENCOUNTER — Encounter: Payer: Self-pay | Admitting: Internal Medicine

## 2020-02-15 ENCOUNTER — Telehealth: Payer: Self-pay | Admitting: Internal Medicine

## 2020-02-15 NOTE — Telephone Encounter (Signed)
Referral sent to sport medicine for arthritis of shoulder.

## 2020-02-15 NOTE — Telephone Encounter (Signed)
New Message:   Pt is calling to get results of her X-ray. Please advise.

## 2020-02-15 NOTE — Telephone Encounter (Signed)
Documenting in imaging tab.

## 2020-03-01 ENCOUNTER — Other Ambulatory Visit: Payer: Self-pay

## 2020-03-01 ENCOUNTER — Ambulatory Visit: Payer: Self-pay

## 2020-03-01 ENCOUNTER — Encounter: Payer: Self-pay | Admitting: Family Medicine

## 2020-03-01 ENCOUNTER — Ambulatory Visit (INDEPENDENT_AMBULATORY_CARE_PROVIDER_SITE_OTHER): Payer: PRIVATE HEALTH INSURANCE | Admitting: Family Medicine

## 2020-03-01 VITALS — BP 120/84 | HR 91 | Ht 63.0 in | Wt 178.0 lb

## 2020-03-01 DIAGNOSIS — M25522 Pain in left elbow: Secondary | ICD-10-CM | POA: Diagnosis not present

## 2020-03-01 DIAGNOSIS — M25531 Pain in right wrist: Secondary | ICD-10-CM | POA: Diagnosis not present

## 2020-03-01 DIAGNOSIS — R700 Elevated erythrocyte sedimentation rate: Secondary | ICD-10-CM | POA: Diagnosis not present

## 2020-03-01 DIAGNOSIS — M79672 Pain in left foot: Secondary | ICD-10-CM

## 2020-03-01 DIAGNOSIS — M79671 Pain in right foot: Secondary | ICD-10-CM | POA: Diagnosis not present

## 2020-03-01 MED ORDER — PREDNISONE 50 MG PO TABS: 50.0000 mg | ORAL_TABLET | Freq: Every day | ORAL | 0 refills | Status: DC

## 2020-03-01 NOTE — Patient Instructions (Addendum)
Thank you for coming in today.  I am doing a workup with labs for gout or lupus or rheumatoid arthritis.  Labs will come back tomorrow and into next week.   Ultrasound of the left elbow shows tennis elbow (lateral epicondylitis).   I think the wrist swelling and pain may be rheumatology issue above.    Feet I am not sure yet.   Plan for prednisone and labs and home exercise.  Ok to use voltaren gel over the counter on the wrist, elbow and feet.   Take prednisone now.   Recheck in 2-4 weeks if not better.    Tennis Elbow Rehab Ask your health care provider which exercises are safe for you. Do exercises exactly as told by your health care provider and adjust them as directed. It is normal to feel mild stretching, pulling, tightness, or discomfort as you do these exercises. Stop right away if you feel sudden pain or your pain gets worse. Do not begin these exercises until told by your health care provider. Stretching and range-of-motion exercises These exercises warm up your muscles and joints and improve the movement and flexibility of your elbow. These exercises also help to relieve pain, numbness, and tingling. Wrist flexion, assisted  1. Straighten your left / right elbow in front of you with your palm facing down toward the floor. ? If told by your health care provider, bend your left / right elbow to a 90-degree angle (right angle) at your side. 2. With your other hand, gently push over the back of your left / right hand so your fingers point toward the floor (flexion). Stop when you feel a gentle stretch on the back of your forearm. 3. Hold this position for __________ seconds. Repeat __________ times. Complete this exercise __________ times a day. Wrist extension, assisted  1. Straighten your left / right elbow in front of you with your palm facing up toward the ceiling. ? If told by your health care provider, bend your left / right elbow to a 90-degree angle (right angle) at  your side. 2. With your other hand, gently pull your left / right hand and fingers toward the floor (extension). Stop when you feel a gentle stretch on the palm side of your forearm. 3. Hold this position for __________ seconds. Repeat __________ times. Complete this exercise __________ times a day. Assisted forearm rotation, supination 1. Sit or stand with your left / right elbow bent to a 90-degree angle (right angle) at your side. 2. Using your uninjured hand, turn (rotate) your left / right palm up toward the ceiling (supination) until you feel a gentle stretch along the inside of your forearm. 3. Hold this position for __________ seconds. Repeat __________ times. Complete this exercise __________ times a day. Assisted forearm rotation, pronation 1. Sit or stand with your left / right elbow bent to a 90-degree angle (right angle) at your side. 2. Using your uninjured hand, rotate your left / right palm down toward the floor (pronation) until you feel a gentle stretch along the outside of your forearm. 3. Hold this position for __________ seconds. Repeat __________ times. Complete this exercise __________ times a day. Strengthening exercises These exercises build strength and endurance in your forearm and elbow. Endurance is the ability to use your muscles for a long time, even after they get tired. Radial deviation  1. Stand with a __________ weight or a hammer in your left / right hand. Or, sit while holding a rubber exercise band  or tubing, with your left / right forearm supported on a table or countertop. ? If you are standing, position your forearm so that your thumb is facing forward. If you are sitting, position your forearm so that the thumb is facing the ceiling. This is the neutral position. 2. Raise your hand upward in front of you so your thumb moves toward the ceiling (radial deviation), or pull up on the rubber tubing. Keep your forearm and elbow still while you move your wrist  only. 3. Hold this position for __________ seconds. 4. Slowly return to the starting position. Repeat __________ times. Complete this exercise __________ times a day. Wrist extension, eccentric 1. Sit with your left / right forearm palm-down and supported on a table or other surface. Let your left / right wrist extend over the edge of the surface. 2. Hold a __________ weight or a piece of exercise band or tubing in your left / right hand. ? If using a rubber exercise band or tubing, hold the other end of the tubing with your other hand. 3. Use your uninjured hand to move your left / right hand up toward the ceiling. 4. Take your uninjured hand away and slowly return to the starting position using only your left / right hand. Lowering your arm under tension is called eccentric extension. Repeat __________ times. Complete this exercise __________ times a day. Wrist extension Do not do this exercise if it causes pain at the outside of your elbow. Only do this exercise once instructed by your health care provider. 1. Sit with your left / right forearm supported on a table or other surface and your palm turned down toward the floor. Let your left / right wrist extend over the edge of the surface. 2. Hold a __________ weight or a piece of rubber exercise band or tubing. ? If you are using a rubber exercise band or tubing, hold the band or tubing in place with your other hand to provide resistance. 3. Slowly bend your wrist so your hand moves up toward the ceiling (extension). Move only your wrist, keeping your forearm and elbow still. 4. Hold this position for __________ seconds. 5. Slowly return to the starting position. Repeat __________ times. Complete this exercise __________ times a day. Forearm rotation, supination To do this exercise, you will need a lightweight hammer or rubber mallet. 1. Sit with your left / right forearm supported on a table or other surface. Bend your elbow to a 90-degree  angle (right angle). Position your forearm so that your palm is facing down toward the floor, with your hand resting over the edge of the table. 2. Hold a hammer in your left / right hand. ? To make this exercise easier, hold the hammer near the head of the hammer. ? To make this exercise harder, hold the hammer near the end of the handle. 3. Without moving your wrist or elbow, slowly rotate your forearm so your palm faces up toward the ceiling (supination). 4. Hold this position for __________ seconds. 5. Slowly return to the starting position. Repeat __________ times. Complete this exercise __________ times a day. Shoulder blade squeeze 1. Sit in a stable chair or stand with good posture. If you are sitting down, do not let your back touch the back of the chair. 2. Your arms should be at your sides with your elbows bent to a 90-degree angle (right angle). Position your forearms so that your thumbs are facing the ceiling (neutral position). 3. Without  lifting your shoulders up, squeeze your shoulder blades tightly together. 4. Hold this position for __________ seconds. 5. Slowly release and return to the starting position. Repeat __________ times. Complete this exercise __________ times a day. This information is not intended to replace advice given to you by your health care provider. Make sure you discuss any questions you have with your health care provider. Document Revised: 10/01/2018 Document Reviewed: 08/04/2018 Elsevier Patient Education  [employer].

## 2020-03-01 NOTE — Progress Notes (Signed)
Subjective:    I'm seeing this patient as a consultation for:  Dr. Ronnald Ramp. Note will be routed back to referring provider/PCP.  CC: R wrist, L shoulder and B feet  I, Molly Weber, LAT, ATC, am serving as scribe for Dr. Lynne Leader.  HPI: Pt is a 64 y/o female presenting w/ c/o chronic L shoulder pain.  She locates her pain to her L superior shoulder.  She states that her shoulder does not hurt today but is having pain in her L elbow and R wrist and B feet.  She was seen by Dr. Felisa Bonier for her feet in July and had an injection with only marginal benefit.  She has she has multiple joints that hurt intermittently from time to time some are severe at times.  She has her mother has a history of lupus.  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  Neck pain: no Aggravating factors: working at nursing home Treatments tried: oral Voltaren; Tramadol; Aleve Arthritis  Diagnostic imaging: L shoulder XR- 02/09/20  Past medical history, Surgical history, Family history, Social history, Allergies, and medications have been entered into the medical record, reviewed.   Review of Systems: No new headache, visual changes, nausea, vomiting, diarrhea, constipation, dizziness, abdominal pain, skin rash, fevers, chills, night sweats, weight loss, swollen lymph nodes, body aches, joint swelling, muscle aches, chest pain, shortness of breath, mood changes, visual or auditory hallucinations.   Objective:    Vitals:   03/01/20 1550  BP: 120/84  Pulse: 91  SpO2: 98%   General: Well Developed, well nourished, and in no acute distress.  Neuro/Psych: Alert and oriented x3, extra-ocular muscles intact, able to move all 4 extremities, sensation grossly intact. Skin: Warm and dry, no rashes noted.  Respiratory: Not using accessory muscles, speaking in full sentences, trachea midline.  Cardiovascular: Pulses palpable, no extremity edema. Abdomen: Does not appear distended. MSK: Right wrist small to moderate effusion  most dominantly laterally.  Decreased wrist motion.  Intact strength.  Tender palpation dorsal lateral wrist.  Left elbow normal-appearing normal motion.  Tender palpation lateral epicondyle. Intact strength. Pain present lateral wrist with resisted wrist extension.  Mild foot swelling bilaterally.  Lab and Radiology Results Diagnostic Limited MSK Ultrasound of: Right wrist left elbow Dorsal wrist moderate effusion present.  Mild hypoechoic fluid tracking 6 dorsal wrist compartment. Left elbow lateral epicondyle with hyperechoic change at tendon insertion superficially consistent with chronic lateral epicondylitis Impression: Right wrist effusion and lateral epicondylitis   Impression and Recommendations:    Assessment and Plan: 64 y.o. female with polyarthralgia.  Possible that patient has multiple discrete orthopedic issues.  However I am concerned she has a systemic issue.  She does have a family history significant for lupus.  Plan for extensive rheumatologic work-up listed below.  Additionally we will proceed with oral steroids as this should be helpful for a variety of potential diagnoses including gout rheumatoid arthritis lupus etc.  We will start her home exercise program for lateral epicondylitis left side.  Also recommend Voltaren gel.  Recheck back in 2 to 4 weeks.  Work note provided.  PDMP not reviewed this encounter. Orders Placed This Encounter  Procedures  . [place] LIMITED JOINT SPACE STRUCTURES UP RIGHT(NO LINKED CHARGES)    Order Specific Question:   Reason for Exam (SYMPTOM  OR DIAGNOSIS REQUIRED)    Answer:   right wrist    Order Specific Question:   Preferred imaging location?    Answer:   [facility]  . CBC with Differential/Platelet    Standing Status:   Future    Standing Expiration Date:   03/01/2021  . TSH    Standing Status:   Future    Standing Expiration Date:   03/01/2021  . Comprehensive metabolic panel    Standing Status:    Future    Standing Expiration Date:   03/01/2021  . Angiotensin converting enzyme    Standing Status:   Future    Standing Expiration Date:   03/01/2021  . Sedimentation rate    Standing Status:   Future    Standing Expiration Date:   03/01/2021  . Rheumatoid factor    Standing Status:   Future    Standing Expiration Date:   03/01/2021  . Cyclic citrul peptide antibody, IgG    Standing Status:   Future    Standing Expiration Date:   03/01/2021  . LUPUS(12) PANEL    Standing Status:   Future    Standing Expiration Date:   03/01/2021  . Uric acid    Standing Status:   Future    Standing Expiration Date:   03/01/2021  . CK    Standing Status:   Future    Standing Expiration Date:   03/01/2021   Meds ordered this encounter  Medications  . predniSONE (DELTASONE) 50 MG tablet    Sig: Take 1 tablet (50 mg total) by mouth daily.    Dispense:  5 tablet    Refill:  0    Discussed warning signs or symptoms. Please see discharge instructions. Patient expresses understanding.   The above documentation has been reviewed and is accurate and complete Lynne Leader, M.D.

## 2020-03-06 LAB — COMPREHENSIVE METABOLIC PANEL
AG Ratio: 0.9 (calc) — ABNORMAL LOW (ref 1.0–2.5)
ALT: 12 U/L (ref 6–29)
AST: 15 U/L (ref 10–35)
Albumin: 3.5 g/dL — ABNORMAL LOW (ref 3.6–5.1)
Alkaline phosphatase (APISO): 65 U/L (ref 37–153)
BUN: 14 mg/dL (ref 7–25)
CO2: 26 mmol/L (ref 20–32)
Calcium: 9.4 mg/dL (ref 8.6–10.4)
Chloride: 102 mmol/L (ref 98–110)
Creat: 0.58 mg/dL (ref 0.50–0.99)
Globulin: 4 g/dL (calc) — ABNORMAL HIGH (ref 1.9–3.7)
Glucose, Bld: 112 mg/dL — ABNORMAL HIGH (ref 65–99)
Potassium: 4 mmol/L (ref 3.5–5.3)
Sodium: 138 mmol/L (ref 135–146)
Total Bilirubin: 0.4 mg/dL (ref 0.2–1.2)
Total Protein: 7.5 g/dL (ref 6.1–8.1)

## 2020-03-06 LAB — LUPUS(12) PANEL
Anti Nuclear Antibody (ANA): POSITIVE — AB
C3 Complement: 149 mg/dL (ref 83–193)
C4 Complement: 33 mg/dL (ref 15–57)
ENA SM Ab Ser-aCnc: <1 AI
Rheumatoid fact SerPl-aCnc: 385 IU/mL — ABNORMAL HIGH (ref <14)
Ribosomal P Protein Ab: <1 AI
SM/RNP: <1 AI
SSA (Ro) (ENA) Antibody, IgG: <1 AI
SSB (La) (ENA) Antibody, IgG: <1 AI
Scleroderma (Scl-70) (ENA) Antibody, IgG: <1 AI
Thyroperoxidase Ab SerPl-aCnc: 118 IU/mL — ABNORMAL HIGH (ref <9)
ds DNA Ab: <1 IU/mL

## 2020-03-06 LAB — CBC WITH DIFFERENTIAL/PLATELET
Absolute Monocytes: 673 cells/uL (ref 200–950)
Basophils Absolute: 59 cells/uL (ref 0–200)
Basophils Relative: 0.9 %
Eosinophils Absolute: 205 cells/uL (ref 15–500)
Eosinophils Relative: 3.1 %
HCT: 34.9 % — ABNORMAL LOW (ref 35.0–45.0)
Hemoglobin: 11.4 g/dL — ABNORMAL LOW (ref 11.7–15.5)
Lymphs Abs: 1815 cells/uL (ref 850–3900)
MCH: 28.1 pg (ref 27.0–33.0)
MCHC: 32.7 g/dL (ref 32.0–36.0)
MCV: 86.2 fL (ref 80.0–100.0)
MPV: 10 fL (ref 7.5–12.5)
Monocytes Relative: 10.2 %
Neutro Abs: 3848 cells/uL (ref 1500–7800)
Neutrophils Relative %: 58.3 %
Platelets: 361 10*3/uL (ref 140–400)
RBC: 4.05 10*6/uL (ref 3.80–5.10)
RDW: 12 % (ref 11.0–15.0)
Total Lymphocyte: 27.5 %
WBC: 6.6 10*3/uL (ref 3.8–10.8)

## 2020-03-06 LAB — ANTI-NUCLEAR AB-TITER (ANA TITER)
ANA TITER: 1:1280 {titer} — ABNORMAL HIGH
ANA Titer 1: 1:40 {titer} — ABNORMAL HIGH

## 2020-03-06 LAB — TSH: TSH: 1.47 mIU/L (ref 0.40–4.50)

## 2020-03-06 LAB — CK: Total CK: 142 U/L (ref 29–143)

## 2020-03-06 LAB — URIC ACID: Uric Acid, Serum: 4.2 mg/dL (ref 2.5–7.0)

## 2020-03-06 LAB — ANGIOTENSIN CONVERTING ENZYME: Angiotensin-Converting Enzyme: 21 U/L (ref 9–67)

## 2020-03-06 LAB — SEDIMENTATION RATE: Sed Rate: 127 mm/h — ABNORMAL HIGH (ref 0–30)

## 2020-03-06 LAB — CYCLIC CITRUL PEPTIDE ANTIBODY, IGG: Cyclic Citrullin Peptide Ab: >250 UNITS — ABNORMAL HIGH

## 2020-03-06 NOTE — Progress Notes (Signed)
Rheum labs are concerning for rheumatoid arthritis. I have referred you to rheumatology. You should hear soon. Let me know if you do not hear about an appointment.

## 2020-03-06 NOTE — Addendum Note (Signed)
Addended by: Gregor Hams on: 03/06/2020 07:38 PM   Modules accepted: Orders

## 2020-03-13 ENCOUNTER — Telehealth: Payer: Self-pay | Admitting: Family Medicine

## 2020-03-13 DIAGNOSIS — M159 Polyosteoarthritis, unspecified: Secondary | ICD-10-CM

## 2020-03-13 DIAGNOSIS — M150 Primary generalized (osteo)arthritis: Secondary | ICD-10-CM

## 2020-03-13 MED ORDER — TRAMADOL HCL 50 MG PO TABS: 50.0000 mg | ORAL_TABLET | Freq: Four times a day (QID) | ORAL | 3 refills | Status: DC | PRN

## 2020-03-13 NOTE — Telephone Encounter (Signed)
Tramadol refilled.

## 2020-03-13 NOTE — Telephone Encounter (Signed)
Faxed pt FMLA/Life insurance ppwk to employer per her request. Pt requests copies mailed to her, done today.  Pt seems unsure when/if she is going to go back to work. Requests note putting her OOW through the end of the month. Has not heard from [facility]. Also still having trouble with her hands and has been out of meds. Can we refill tramadol?

## 2020-03-14 ENCOUNTER — Encounter: Payer: Self-pay | Admitting: Family Medicine

## 2020-03-14 NOTE — Telephone Encounter (Signed)
Called pt and informed her that her new work letter was written and is available for pick-up at the office or that we can deliver it to her by another means.  Pt states that she would like [place] to mail it to her.  She asks about her pain medication and I inform her that it has been sent in to her pharmacy.

## 2020-03-14 NOTE — Telephone Encounter (Signed)
Letter written

## 2020-03-23 ENCOUNTER — Telehealth: Payer: Self-pay | Admitting: Family Medicine

## 2020-03-23 NOTE — Telephone Encounter (Signed)
Called patient and is scheduled with [facility] in November.

## 2020-03-23 NOTE — Telephone Encounter (Signed)
Please contact patient to see if she is heard from rheumatology.  She really needs a follow-up appointment with them.

## 2020-04-10 ENCOUNTER — Telehealth: Payer: Self-pay | Admitting: Family Medicine

## 2020-04-10 NOTE — Telephone Encounter (Signed)
Pt employer called [employer] fax [phone]), she is trying to help pt retain her disability. Employer needs a note from [place] stating she was OOW effective from 03/01/2020 until at least 04/25/2020 when she meets with rheumatology.  Please fax to [place], info above.

## 2020-04-11 ENCOUNTER — Encounter: Payer: Self-pay | Admitting: Family Medicine

## 2020-04-11 NOTE — Progress Notes (Signed)
Pt employer called [facility] fax [phone]), she is trying to help pt retain her disability. Employer needs a note from [place] stating she was OOW effective from 03/01/2020 until at least 04/25/2020 when she meets with rheumatology.  Please fax to [place], info above.

## 2020-04-11 NOTE — Telephone Encounter (Signed)
Done

## 2020-05-26 ENCOUNTER — Ambulatory Visit
Admission: RE | Admit: 2020-05-26 | Discharge: 2020-05-26 | Disposition: A | Discharge status: Home / Self Care | Payer: No Typology Code available for payment source | Source: Ambulatory Visit | Attending: Obstetrics and Gynecology | Admitting: Obstetrics and Gynecology

## 2020-05-26 ENCOUNTER — Other Ambulatory Visit: Payer: Self-pay

## 2020-05-26 ENCOUNTER — Other Ambulatory Visit: Payer: Self-pay | Admitting: Obstetrics and Gynecology

## 2020-05-26 DIAGNOSIS — R7611 Nonspecific reaction to tuberculin skin test without active tuberculosis: Secondary | ICD-10-CM

## 2020-06-24 ENCOUNTER — Other Ambulatory Visit: Payer: Self-pay

## 2020-06-24 ENCOUNTER — Ambulatory Visit
Admission: EM | Admit: 2020-06-24 | Discharge: 2020-06-24 | Disposition: A | Discharge status: Home / Self Care | Payer: PRIVATE HEALTH INSURANCE

## 2020-06-24 DIAGNOSIS — M791 Myalgia, unspecified site: Secondary | ICD-10-CM

## 2020-06-24 DIAGNOSIS — J069 Acute upper respiratory infection, unspecified: Secondary | ICD-10-CM

## 2020-06-24 MED ORDER — METHYLPREDNISOLONE SODIUM SUCC 125 MG IJ SOLR
125.0000 mg | Freq: Once | INTRAMUSCULAR | Status: AC
  Administered 2020-06-24: 125 mg via INTRAMUSCULAR

## 2020-06-24 NOTE — ED Triage Notes (Addendum)
Pt is here with body aches that started 2 days ago, pt has not taken any meds to relieve discomfort.

## 2020-06-24 NOTE — ED Provider Notes (Signed)
[facility]    CSN: [id number] Arrival date & time: 06/24/20  1114      History   Chief Complaint Chief Complaint  Patient presents with  . Generalized Body Aches    HPI Melanie Garrett is a 65 y.o. female  With extensive history as below presenting for myalgias x2 days.  Has had mild URI symptoms as well.  No chest pain, difficulty breathing, fever.  Denies change in bowel or bladder habit, appearance of urine.  No history of rhabdo.  Not take anything for this.  History reviewed. No pertinent past medical history.  Patient Active Problem List   Diagnosis Date Noted  . Chronic left shoulder pain 02/09/2020  . Osteoarthritis 02/09/2020  . Obesity (BMI 30.0-34.9) 06/29/2018  . 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 Routine general medical examination at a health care facility 09/02/2016  . Eczema, dyshidrotic 02/02/2015    History reviewed. No pertinent surgical history.  OB History   No obstetric history on file.      Home Medications    Prior to Admission medications   Medication Sig Start Date End Date Taking? Authorizing Provider  folic acid (FOLVITE) 1 MG tablet Take 1 mg by mouth daily. 05/11/20   [provider]  methotrexate 2.5 MG tablet  05/11/20   [provider]  predniSONE (DELTASONE) 50 MG tablet Take 1 tablet (50 mg total) by mouth daily. 03/01/20   Rodolph Bong, MD  traMADol (ULTRAM) 50 MG tablet Take 1 tablet (50 mg total) by mouth every 6 (six) hours as needed. 03/13/20   Rodolph Bong, MD    Family History Family History  Problem Relation Age of Onset  . Arthritis Mother   . Alcohol abuse Neg Hx   . Cancer Neg Hx   . Diabetes Neg Hx   . Drug abuse Neg Hx   . Early death Neg Hx   . Heart disease Neg Hx   . Stroke Neg Hx     Social History Social History   Tobacco Use  . Smoking status: Never Smoker  . Smokeless tobacco: Never Used  Substance Use Topics  . Alcohol use: No  . Drug use: No     Allergies    Patient has no known allergies.   Review of Systems Review of Systems  Constitutional: Positive for appetite change and chills. Negative for fatigue and fever.  HENT: Positive for congestion and rhinorrhea. Negative for dental problem, ear pain, facial swelling, hearing loss, sinus pain, sore throat, trouble swallowing and voice change.   Eyes: Negative for photophobia, pain and visual disturbance.  Respiratory: Negative for cough and shortness of breath.   Cardiovascular: Negative for chest pain and palpitations.  Gastrointestinal: Negative for diarrhea, nausea and vomiting.  Musculoskeletal: Positive for myalgias. Negative for arthralgias.  Neurological: Negative for dizziness and headaches.     Physical Exam Triage Vital Signs ED Triage Vitals  Enc Vitals Group     BP 06/24/20 1158 106/73     Pulse Rate 06/24/20 1158 (!) 117     Resp 06/24/20 1158 17     Temp 06/24/20 1158 98.2 F (36.8 C)     Temp Source 06/24/20 1158 Oral     SpO2 06/24/20 1158 91 %     Weight --      Height --      Head Circumference --      Peak Flow --      Pain Score 06/24/20 1156 0  Pain Loc --      Pain Edu? --      Excl. in [place]? --    No data found.  Updated Vital Signs BP 106/73 (BP Location: Left Arm)   Pulse (!) 117   Temp 98.2 F (36.8 C) (Oral)   Resp 17   SpO2 91%   Visual Acuity Right Eye Distance:   Left Eye Distance:   Bilateral Distance:    Right Eye Near:   Left Eye Near:    Bilateral Near:     Physical Exam Constitutional:      General: She is not in acute distress.    Appearance: She is ill-appearing. She is not toxic-appearing or diaphoretic.  HENT:     Head: Normocephalic and atraumatic.     Right Ear: Tympanic membrane and ear canal normal.     Left Ear: Tympanic membrane and ear canal normal.     Mouth/Throat:     Mouth: Mucous membranes are moist.     Pharynx: Oropharynx is clear. No oropharyngeal exudate or posterior oropharyngeal erythema.  Eyes:      General: No scleral icterus.    Conjunctiva/sclera: Conjunctivae normal.     Pupils: Pupils are equal, round, and reactive to light.  Neck:     Comments: Trachea midline, negative JVD Cardiovascular:     Rate and Rhythm: Regular rhythm. Tachycardia present.     Heart sounds: No murmur heard. No gallop.   Pulmonary:     Effort: Pulmonary effort is normal. No respiratory distress.     Breath sounds: No wheezing, rhonchi or rales.  Musculoskeletal:     Cervical back: Neck supple. No tenderness.  Lymphadenopathy:     Cervical: No cervical adenopathy.  Skin:    Capillary Refill: Capillary refill takes less than 2 seconds.     Coloration: Skin is not jaundiced or pale.     Findings: No rash.  Neurological:     General: No focal deficit present.     Mental Status: She is alert and oriented to person, place, and time.      UC Treatments / Results  Labs (all labs ordered are listed, but only abnormal results are displayed) Labs Reviewed  NOVEL CORONAVIRUS, NAA    EKG   Radiology No results found.  Procedures Procedures (including critical care time)  Medications Ordered in UC Medications  methylPREDNISolone sodium succinate (SOLU-MEDROL) 125 mg/2 mL injection 125 mg (125 mg Intramuscular Given 06/24/20 1244)    Initial Impression / Assessment and Plan / UC Course  I have reviewed the triage vital signs and the nursing notes.  Pertinent labs & imaging results that were available during my care of the patient were reviewed by me and considered in my medical decision making (see chart for details).     Patient afebrile, nontoxic, with SpO2 91%.  Covid PCR pending.  Patient to quarantine until results are back.  We will treat supportively as outlined below.  Strict ER return precautions discussed, patient verbalized understanding and is agreeable to plan. Final Clinical Impressions(s) / UC Diagnoses   Final diagnoses:  Myalgia  Viral URI   Discharge Instructions    None    ED Prescriptions    None     PDMP not reviewed this encounter.   [place], [place], [place] 06/24/20 1355

## 2020-06-27 LAB — NOVEL CORONAVIRUS, NAA: SARS-CoV-2, NAA: NOT DETECTED

## 2020-08-16 ENCOUNTER — Encounter: Payer: Self-pay | Admitting: Internal Medicine

## 2020-08-16 ENCOUNTER — Other Ambulatory Visit: Payer: Self-pay

## 2020-08-16 ENCOUNTER — Ambulatory Visit (INDEPENDENT_AMBULATORY_CARE_PROVIDER_SITE_OTHER): Payer: 59 | Admitting: Internal Medicine

## 2020-08-16 VITALS — BP 136/84 | HR 85 | Temp 98.2°F | Resp 16 | Ht 63.0 in | Wt 169.0 lb

## 2020-08-16 DIAGNOSIS — Z0000 Encounter for general adult medical examination without abnormal findings: Secondary | ICD-10-CM

## 2020-08-16 DIAGNOSIS — Z1231 Encounter for screening mammogram for malignant neoplasm of breast: Secondary | ICD-10-CM | POA: Insufficient documentation

## 2020-08-16 DIAGNOSIS — E876 Hypokalemia: Secondary | ICD-10-CM | POA: Diagnosis not present

## 2020-08-16 DIAGNOSIS — R519 Headache, unspecified: Secondary | ICD-10-CM | POA: Diagnosis not present

## 2020-08-16 DIAGNOSIS — Z1211 Encounter for screening for malignant neoplasm of colon: Secondary | ICD-10-CM

## 2020-08-16 DIAGNOSIS — G43019 Migraine without aura, intractable, without status migrainosus: Secondary | ICD-10-CM

## 2020-08-16 DIAGNOSIS — D539 Nutritional anemia, unspecified: Secondary | ICD-10-CM | POA: Diagnosis not present

## 2020-08-16 DIAGNOSIS — R739 Hyperglycemia, unspecified: Secondary | ICD-10-CM | POA: Insufficient documentation

## 2020-08-16 DIAGNOSIS — Z124 Encounter for screening for malignant neoplasm of cervix: Secondary | ICD-10-CM

## 2020-08-16 LAB — CBC WITH DIFFERENTIAL/PLATELET
Basophils Absolute: 0 10*3/uL (ref 0.0–0.1)
Basophils Relative: 0.5 % (ref 0.0–3.0)
Eosinophils Absolute: 0 10*3/uL (ref 0.0–0.7)
Eosinophils Relative: 0.5 % (ref 0.0–5.0)
HCT: 36.5 % (ref 36.0–46.0)
Hemoglobin: 12.4 g/dL (ref 12.0–15.0)
Lymphocytes Relative: 13.9 % (ref 12.0–46.0)
Lymphs Abs: 1 10*3/uL (ref 0.7–4.0)
MCHC: 34 g/dL (ref 30.0–36.0)
MCV: 87 fl (ref 78.0–100.0)
Monocytes Absolute: 0.4 10*3/uL (ref 0.1–1.0)
Monocytes Relative: 5.9 % (ref 3.0–12.0)
Neutro Abs: 5.9 10*3/uL (ref 1.4–7.7)
Neutrophils Relative %: 79.2 % — ABNORMAL HIGH (ref 43.0–77.0)
Platelets: 219 10*3/uL (ref 150.0–400.0)
RBC: 4.19 Mil/uL (ref 3.87–5.11)
RDW: 16.4 % — ABNORMAL HIGH (ref 11.5–15.5)
WBC: 7.4 10*3/uL (ref 4.0–10.5)

## 2020-08-16 LAB — BASIC METABOLIC PANEL
BUN: 17 mg/dL (ref 6–23)
CO2: 29 mEq/L (ref 19–32)
Calcium: 9.1 mg/dL (ref 8.4–10.5)
Chloride: 103 mEq/L (ref 96–112)
Creatinine, Ser: 0.63 mg/dL (ref 0.40–1.20)
GFR: 93.9 mL/min (ref >60.00)
Glucose, Bld: 87 mg/dL (ref 70–99)
Potassium: 3.4 mEq/L — ABNORMAL LOW (ref 3.5–5.1)
Sodium: 139 mEq/L (ref 135–145)

## 2020-08-16 LAB — HEMOGLOBIN A1C: Hgb A1c MFr Bld: 5.9 % (ref 4.6–6.5)

## 2020-08-16 LAB — HEPATIC FUNCTION PANEL
ALT: 13 U/L (ref 0–35)
AST: 12 U/L (ref 0–37)
Albumin: 3.6 g/dL (ref 3.5–5.2)
Alkaline Phosphatase: 50 U/L (ref 39–117)
Bilirubin, Direct: 0.1 mg/dL (ref 0.0–0.3)
Total Bilirubin: 0.5 mg/dL (ref 0.2–1.2)
Total Protein: 6.9 g/dL (ref 6.0–8.3)

## 2020-08-16 LAB — LIPID PANEL
Cholesterol: 212 mg/dL — ABNORMAL HIGH (ref 0–200)
HDL: 66 mg/dL (ref >39.00)
LDL Cholesterol: 124 mg/dL — ABNORMAL HIGH (ref 0–99)
NonHDL: 145.83
Total CHOL/HDL Ratio: 3
Triglycerides: 107 mg/dL (ref 0.0–149.0)
VLDL: 21.4 mg/dL (ref 0.0–40.0)

## 2020-08-16 LAB — FERRITIN: Ferritin: 210 ng/mL (ref 10.0–291.0)

## 2020-08-16 LAB — IRON: Iron: 86 ug/dL (ref 42–145)

## 2020-08-16 LAB — FOLATE: Folate: >23.6 ng/mL (ref >5.9)

## 2020-08-16 LAB — TSH: TSH: 1.1 u[IU]/mL (ref 0.35–4.50)

## 2020-08-16 LAB — VITAMIN B12: Vitamin B-12: 270 pg/mL (ref 211–911)

## 2020-08-16 LAB — SEDIMENTATION RATE: Sed Rate: 21 mm/hr (ref 0–30)

## 2020-08-16 MED ORDER — POTASSIUM CHLORIDE CRYS ER 20 MEQ PO TBCR: 20.0000 meq | EXTENDED_RELEASE_TABLET | Freq: Every day | ORAL | 0 refills | Status: DC

## 2020-08-16 NOTE — Patient Instructions (Signed)
Health Maintenance, Female Adopting a healthy lifestyle and getting preventive care are important in promoting health and wellness. Ask your health care provider about:  The right schedule for you to have regular tests and exams.  Things you can do on your own to prevent diseases and keep yourself healthy. What should I know about diet, weight, and exercise? Eat a healthy diet  Eat a diet that includes plenty of vegetables, fruits, low-fat dairy products, and lean protein.  Do not eat a lot of foods that are high in solid fats, added sugars, or sodium.   Maintain a healthy weight Body mass index (BMI) is used to identify weight problems. It estimates body fat based on height and weight. Your health care provider can help determine your BMI and help you achieve or maintain a healthy weight. Get regular exercise Get regular exercise. This is one of the most important things you can do for your health. Most adults should:  Exercise for at least 150 minutes each week. The exercise should increase your heart rate and make you sweat (moderate-intensity exercise).  Do strengthening exercises at least twice a week. This is in addition to the moderate-intensity exercise.  Spend less time sitting. Even light physical activity can be beneficial. Watch cholesterol and blood lipids Have your blood tested for lipids and cholesterol at 65 years of age, then have this test every 5 years. Have your cholesterol levels checked more often if:  Your lipid or cholesterol levels are high.  You are older than 65 years of age.  You are at high risk for heart disease. What should I know about cancer screening? Depending on your health history and family history, you may need to have cancer screening at various ages. This may include screening for:  Breast cancer.  Cervical cancer.  Colorectal cancer.  Skin cancer.  Lung cancer. What should I know about heart disease, diabetes, and high blood  pressure? Blood pressure and heart disease  High blood pressure causes heart disease and increases the risk of stroke. This is more likely to develop in people who have high blood pressure readings, are of African descent, or are overweight.  Have your blood pressure checked: ? Every 3-5 years if you are 18-39 years of age. ? Every year if you are 40 years old or older. Diabetes Have regular diabetes screenings. This checks your fasting blood sugar level. Have the screening done:  Once every three years after age 40 if you are at a normal weight and have a low risk for diabetes.  More often and at a younger age if you are overweight or have a high risk for diabetes. What should I know about preventing infection? Hepatitis B If you have a higher risk for hepatitis B, you should be screened for this virus. Talk with your health care provider to find out if you are at risk for hepatitis B infection. Hepatitis C Testing is recommended for:  Everyone born from 1945 through 1965.  Anyone with known risk factors for hepatitis C. Sexually transmitted infections (STIs)  Get screened for STIs, including gonorrhea and chlamydia, if: ? You are sexually active and are younger than 65 years of age. ? You are older than 65 years of age and your health care provider tells you that you are at risk for this type of infection. ? Your sexual activity has changed since you were last screened, and you are at increased risk for chlamydia or gonorrhea. Ask your health care provider   if you are at risk.  Ask your health care provider about whether you are at high risk for HIV. Your health care provider may recommend a prescription medicine to help prevent HIV infection. If you choose to take medicine to prevent HIV, you should first get tested for HIV. You should then be tested every 3 months for as long as you are taking the medicine. Pregnancy  If you are about to stop having your period (premenopausal) and  you may become pregnant, seek counseling before you get pregnant.  Take 400 to 800 micrograms (mcg) of folic acid every day if you become pregnant.  Ask for birth control (contraception) if you want to prevent pregnancy. Osteoporosis and menopause Osteoporosis is a disease in which the bones lose minerals and strength with aging. This can result in bone fractures. If you are 65 years old or older, or if you are at risk for osteoporosis and fractures, ask your health care provider if you should:  Be screened for bone loss.  Take a calcium or vitamin D supplement to lower your risk of fractures.  Be given hormone replacement therapy (HRT) to treat symptoms of menopause. Follow these instructions at home: Lifestyle  Do not use any products that contain nicotine or tobacco, such as cigarettes, e-cigarettes, and chewing tobacco. If you need help quitting, ask your health care provider.  Do not use street drugs.  Do not share needles.  Ask your health care provider for help if you need support or information about quitting drugs. Alcohol use  Do not drink alcohol if: ? Your health care provider tells you not to drink. ? You are pregnant, may be pregnant, or are planning to become pregnant.  If you drink alcohol: ? Limit how much you use to 0-1 drink a day. ? Limit intake if you are breastfeeding.  Be aware of how much alcohol is in your drink. In the [place] one drink equals one 12 oz bottle of beer (355 mL), one 5 oz glass of wine (148 mL), or one 1 oz glass of hard liquor (44 mL). General instructions  Schedule regular health, dental, and eye exams.  Stay current with your vaccines.  Tell your health care provider if: ? You often feel depressed. ? You have ever been abused or do not feel safe at home. Summary  Adopting a healthy lifestyle and getting preventive care are important in promoting health and wellness.  Follow your health care provider's instructions about healthy  diet, exercising, and getting tested or screened for diseases.  Follow your health care provider's instructions on monitoring your cholesterol and blood pressure. This information is not intended to replace advice given to you by your health care provider. Make sure you discuss any questions you have with your health care provider. Document Revised: 06/03/2018 Document Reviewed: 06/03/2018 Elsevier Patient Education  2021 [employer]  

## 2020-08-16 NOTE — Progress Notes (Unsigned)
Subjective:  Patient ID: Melanie Garrett, female    DOB: 01/04/1956  Age: 65 y.o. MRN: [id number]  CC: Annual Exam, Anemia, and Headache  This visit occurred during the SARS-CoV-2 public health emergency.  Safety protocols were in place, including screening questions prior to the visit, additional usage of staff PPE, and extensive cleaning of exam room while observing appropriate contact time as indicated for disinfecting solutions.    HPI Melanie Garrett presents for a CPX.  She complains of a 1 week hx of posterior midline HA that she describes as an aching pain. She denies N/V/N/W/T.   She has a history of anemia.  She is tolerating a folate supplement but no other supplements.  She denies sources of blood loss, fatigue, shortness of breath, or paresthesias.  Outpatient Medications Prior to Visit  Medication Sig Dispense Refill  . folic acid (FOLVITE) 1 MG tablet Take 1 mg by mouth daily.    . methotrexate 2.5 MG tablet     . traMADol (ULTRAM) 50 MG tablet Take 1 tablet (50 mg total) by mouth every 6 (six) hours as needed. 75 tablet 3  . predniSONE (DELTASONE) 50 MG tablet Take 1 tablet (50 mg total) by mouth daily. 5 tablet 0   No facility-administered medications prior to visit.    ROS Review of Systems  Constitutional: Negative for appetite change, diaphoresis, fatigue and fever.  HENT: Negative.   Eyes: Negative.   Respiratory: Negative for cough, chest tightness, shortness of breath and wheezing.   Cardiovascular: Negative for chest pain, palpitations and leg swelling.  Gastrointestinal: Negative for abdominal pain, constipation, diarrhea, nausea and vomiting.  Endocrine: Negative.   Genitourinary: Negative.  Negative for difficulty urinating.  Musculoskeletal: Negative.  Negative for arthralgias, joint swelling and myalgias.  Skin: Negative.   Neurological: Positive for headaches. Negative for dizziness, tremors, weakness, light-headedness and numbness.   Hematological: Negative for adenopathy. Does not bruise/bleed easily.  Psychiatric/Behavioral: Negative.     Objective:  BP 136/84   Pulse 85   Temp 98.2 F (36.8 C) (Oral)   Resp 16   Ht 5\' 3"  (1.6 m)   Wt 169 lb (76.7 kg)   SpO2 98%   BMI 29.94 kg/m   BP Readings from Last 3 Encounters:  08/16/20 136/84  06/24/20 106/73  03/01/20 120/84    Wt Readings from Last 3 Encounters:  08/16/20 169 lb (76.7 kg)  03/01/20 178 lb (80.7 kg)  02/09/20 182 lb 4 oz (82.7 kg)    Physical Exam Vitals reviewed.  Constitutional:      General: She is not in acute distress.    Appearance: She is well-developed. She is not ill-appearing, toxic-appearing or diaphoretic.  HENT:     Mouth/Throat:     Mouth: Mucous membranes are moist.  Eyes:     General: No scleral icterus.    Extraocular Movements: Extraocular movements intact.     Right eye: Normal extraocular motion.     Left eye: Normal extraocular motion.     Pupils: Pupils are equal, round, and reactive to light.  Cardiovascular:     Rate and Rhythm: Normal rate and regular rhythm.     Heart sounds: No murmur heard.   Pulmonary:     Effort: Pulmonary effort is normal.     Breath sounds: No stridor. No wheezing, rhonchi or rales.  Abdominal:     General: Abdomen is flat. Bowel sounds are normal. There is no distension.     Palpations: Abdomen  is soft. There is no fluid wave, hepatomegaly, splenomegaly or mass.     Tenderness: There is no abdominal tenderness.  Musculoskeletal:        General: Normal range of motion.     Cervical back: Normal range of motion and neck supple.     Right lower leg: No edema.     Left lower leg: No edema.  Skin:    General: Skin is warm and dry.     Coloration: Skin is not pale.  Neurological:     General: No focal deficit present.     Mental Status: She is alert and oriented to person, place, and time. Mental status is at baseline.     Cranial Nerves: Cranial nerves are intact.      Sensory: Sensation is intact.     Motor: Motor function is intact.     Coordination: Coordination is intact.     Gait: Gait is intact.     Deep Tendon Reflexes: Reflexes are normal and symmetric.  Psychiatric:        Mood and Affect: Mood normal.        Behavior: Behavior normal.        Thought Content: Thought content normal.        Judgment: Judgment normal.     Lab Results  Component Value Date   WBC 7.4 08/16/2020   HGB 12.4 08/16/2020   HCT 36.5 08/16/2020   PLT 219.0 08/16/2020   GLUCOSE 87 08/16/2020   CHOL 212 (H) 08/16/2020   TRIG 107.0 08/16/2020   HDL 66.00 08/16/2020   LDLCALC 124 (H) 08/16/2020   ALT 13 08/16/2020   AST 12 08/16/2020   NA 139 08/16/2020   K 3.4 (L) 08/16/2020   CL 103 08/16/2020   CREATININE 0.63 08/16/2020   BUN 17 08/16/2020   CO2 29 08/16/2020   TSH 1.10 08/16/2020   HGBA1C 5.9 08/16/2020    Brain Parenchyma:  No parenchymal hemorrhage. Mild, subtle patchy areas of subcortical decreased density in the frontal and parietal lobes. No intraparenchymal mass evident.   Ventricles and Extra-axial Spaces:  No hydrocephalus.  No midline shift or mass effect.  Visualized paranasal sinuses and mastoid air cells:  Clear.  Calvarium / Soft tissues / Vasculature:  No evidence of skull fracture.    IMPRESSION:  1. No definiteacute intracranial abnormality.  2. One or more discrete white matter lesions are identified. These lesions are abnormal but nonspecific, usually resulting from benign/remote/incidental causes (e.g. telangiectasias, perivascular spaces, prior trauma/inflammation/demyelination, or chronic ischemia associated with migraine/atherosclerosis/other vasculopathies).    Assessment & Plan:   Melanie Garrett was seen today for annual exam, anemia and headache.  Diagnoses and all orders for this visit:  Routine general medical examination at a health care facility- Exam completed, labs reviewed, vaccines reviewed and updated,  cancer screenings addressed, patient education was given. -     Lipid panel; Future -     Lipid panel  Hyperglycemia- Her A1c is at 5.9%.  She has mild prediabetes.  Medical therapy is not indicated. -     Basic metabolic panel; Future -     Hemoglobin A1c; Future -     Hemoglobin A1c -     Basic metabolic panel  Deficiency anemia- Her H&H are normal.  I will screen her for vitamin deficiencies. -     Vitamin B1; Future -     TSH; Future -     Lactate dehydrogenase; Future -     CBC  with Differential/Platelet; Future -     Vitamin B12; Future -     Folate; Future -     Ferritin; Future -     Reticulocytes; Future -     Iron; Future -     Hepatic function panel; Future -     Hepatic function panel -     Iron -     Reticulocytes -     Ferritin -     Folate -     Vitamin B12 -     CBC with Differential/Platelet -     Lactate dehydrogenase -     TSH -     Vitamin B1  Screen for colon cancer -     Cologuard  Visit for screening mammogram -     MM DIGITAL SCREENING BILATERAL; Future  Acute intractable headache, unspecified headache type- Based on her symptoms, exam, and CT scan I think she is experiencing a migraine.  I recommended she treat this with a CGRP antagonist. -     Sedimentation rate; Future -     Sedimentation rate -     CT Head Wo Contrast; Future  Hypokalemia due to inadequate potassium intake -     potassium chloride SA (KLOR-CON) 20 MEQ tablet; Take 1 tablet (20 mEq total) by mouth daily.  Intractable migraine without aura and without status migrainosus -     Ubrogepant (UBRELVY) 100 MG TABS; Take 1 tablet by mouth daily as needed.   I have discontinued Laure A. Elamin's predniSONE. I am also having her start on potassium chloride SA and Ubrelvy. Additionally, I am having her maintain her traMADol, methotrexate, and folic acid.  Meds ordered this encounter  Medications  . potassium chloride SA (KLOR-CON) 20 MEQ tablet    Sig: Take 1 tablet (20 mEq  total) by mouth daily.    Dispense:  90 tablet    Refill:  0  . Ubrogepant (UBRELVY) 100 MG TABS    Sig: Take 1 tablet by mouth daily as needed.    Dispense:  30 tablet    Refill:  1     Follow-up: Return in about 4 weeks (around 09/13/2020).  [name], MD

## 2020-08-21 ENCOUNTER — Telehealth: Payer: Self-pay

## 2020-08-21 ENCOUNTER — Encounter: Payer: Self-pay | Admitting: Internal Medicine

## 2020-08-21 DIAGNOSIS — G43019 Migraine without aura, intractable, without status migrainosus: Secondary | ICD-10-CM | POA: Insufficient documentation

## 2020-08-21 DIAGNOSIS — Z124 Encounter for screening for malignant neoplasm of cervix: Secondary | ICD-10-CM | POA: Insufficient documentation

## 2020-08-21 LAB — RETICULOCYTES
ABS Retic: 59640 cells/uL (ref 20000–8000)
Retic Ct Pct: 1.4 %

## 2020-08-21 LAB — LACTATE DEHYDROGENASE: LDH: 226 U/L (ref 120–250)

## 2020-08-21 LAB — VITAMIN B1: Vitamin B1 (Thiamine): 14 nmol/L (ref 8–30)

## 2020-08-21 MED ORDER — UBRELVY 100 MG PO TABS: 1.0000 | ORAL_TABLET | Freq: Every day | ORAL | 1 refills | Status: DC | PRN

## 2020-08-21 NOTE — Telephone Encounter (Signed)
Pt stated that she would like to take a medication for the migraines.

## 2020-08-21 NOTE — Telephone Encounter (Signed)
-----   Message from Janith Lima, MD sent at 08/20/2020 12:48 PM EST ----- Regarding: CT scan Pls let her know that her CT scan showed signs of migraine headaches. Is she willing to take a med for this?  TJ

## 2020-10-17 LAB — COLOGUARD
COLOGUARD: NEGATIVE
Cologuard: NEGATIVE

## 2021-03-08 ENCOUNTER — Other Ambulatory Visit: Payer: Self-pay

## 2021-03-08 ENCOUNTER — Ambulatory Visit: Payer: 59 | Admitting: Nurse Practitioner

## 2021-03-08 ENCOUNTER — Ambulatory Visit (INDEPENDENT_AMBULATORY_CARE_PROVIDER_SITE_OTHER): Payer: 59 | Admitting: Nurse Practitioner

## 2021-03-08 VITALS — BP 128/82 | HR 85 | Temp 98.3°F | Ht 63.0 in | Wt 163.0 lb

## 2021-03-08 DIAGNOSIS — R432 Parageusia: Secondary | ICD-10-CM

## 2021-03-08 DIAGNOSIS — Z23 Encounter for immunization: Secondary | ICD-10-CM | POA: Diagnosis not present

## 2021-03-08 DIAGNOSIS — R5383 Other fatigue: Secondary | ICD-10-CM | POA: Diagnosis not present

## 2021-03-08 LAB — COMPREHENSIVE METABOLIC PANEL
ALT: 16 U/L (ref 0–35)
AST: 22 U/L (ref 0–37)
Albumin: 3.9 g/dL (ref 3.5–5.2)
Alkaline Phosphatase: 66 U/L (ref 39–117)
BUN: 8 mg/dL (ref 6–23)
CO2: 28 mEq/L (ref 19–32)
Calcium: 9.9 mg/dL (ref 8.4–10.5)
Chloride: 103 mEq/L (ref 96–112)
Creatinine, Ser: 0.53 mg/dL (ref 0.40–1.20)
GFR: 97.51 mL/min (ref >60.00)
Glucose, Bld: 90 mg/dL (ref 70–99)
Potassium: 3.4 mEq/L — ABNORMAL LOW (ref 3.5–5.1)
Sodium: 139 mEq/L (ref 135–145)
Total Bilirubin: 0.8 mg/dL (ref 0.2–1.2)
Total Protein: 7.6 g/dL (ref 6.0–8.3)

## 2021-03-08 LAB — CBC WITH DIFFERENTIAL/PLATELET
Basophils Absolute: 0.2 10*3/uL — ABNORMAL HIGH (ref 0.0–0.1)
Basophils Relative: 3.7 % — ABNORMAL HIGH (ref 0.0–3.0)
Eosinophils Absolute: 0.2 10*3/uL (ref 0.0–0.7)
Eosinophils Relative: 2.8 % (ref 0.0–5.0)
HCT: 38 % (ref 36.0–46.0)
Hemoglobin: 12.4 g/dL (ref 12.0–15.0)
Lymphocytes Relative: 19.5 % (ref 12.0–46.0)
Lymphs Abs: 1.2 10*3/uL (ref 0.7–4.0)
MCHC: 32.6 g/dL (ref 30.0–36.0)
MCV: 84.6 fl (ref 78.0–100.0)
Monocytes Absolute: 0.6 10*3/uL (ref 0.1–1.0)
Monocytes Relative: 10.5 % (ref 3.0–12.0)
Neutro Abs: 3.9 10*3/uL (ref 1.4–7.7)
Neutrophils Relative %: 63.5 % (ref 43.0–77.0)
Platelets: 244 10*3/uL (ref 150.0–400.0)
RBC: 4.49 Mil/uL (ref 3.87–5.11)
RDW: 13.6 % (ref 11.5–15.5)
WBC: 6.2 10*3/uL (ref 4.0–10.5)

## 2021-03-08 LAB — TSH: TSH: 2.41 u[IU]/mL (ref 0.35–5.50)

## 2021-03-08 LAB — VITAMIN B12: Vitamin B-12: 369 pg/mL (ref 211–911)

## 2021-03-08 LAB — T4, FREE: Free T4: 0.78 ng/dL (ref 0.60–1.60)

## 2021-03-08 LAB — T3, FREE: T3, Free: 3.8 pg/mL (ref 2.3–4.2)

## 2021-03-08 NOTE — Progress Notes (Signed)
Subjective:  Patient ID: Melanie Garrett, female    DOB: 04/06/1956  Age: 65 y.o. MRN: [id number]  CC:  Chief Complaint  Patient presents with   Fatigue    Pt states she also has no taste sentation, x 1 month. Pt would like a urine test done for insurance purposes, no symptoms.      HPI  This patient arrives today for the above.  She tells me she has been having altered taste and reduced taste sensation for the last month or so.  She has not been tested for COVID-19 and is not aware of recent COVID-19 infection.  She does have recent diagnosis of rheumatoid arthritis.  She denies any nasal congestion, cough, fevers, nosebleeds, and tells me her sense of smell is intact.  Per chart review I do see that back in February 2022 vitamin B12 was checked and it was in low normal range.  She is also been feeling some mild fatigue for the last month as well.  No past medical history on file.    Family History  Problem Relation Age of Onset   Arthritis Mother    Alcohol abuse Neg Hx    Cancer Neg Hx    Diabetes Neg Hx    Drug abuse Neg Hx    Early death Neg Hx    Heart disease Neg Hx    Stroke Neg Hx     Social History   Social History Narrative   Not on file   Social History   Tobacco Use   Smoking status: Never   Smokeless tobacco: Never  Substance Use Topics   Alcohol use: No     Current Meds  Medication Sig   folic acid (FOLVITE) 1 MG tablet Take 1 mg by mouth daily.   leflunomide (ARAVA) 20 MG tablet Take 20 mg by mouth daily.   methotrexate 2.5 MG tablet    potassium chloride SA (KLOR-CON) 20 MEQ tablet Take 1 tablet (20 mEq total) by mouth daily.   predniSONE (DELTASONE) 10 MG tablet Take 10 mg by mouth daily with breakfast.   traMADol (ULTRAM) 50 MG tablet Take 1 tablet (50 mg total) by mouth every 6 (six) hours as needed.   Ubrogepant (UBRELVY) 100 MG TABS Take 1 tablet by mouth daily as needed.    ROS:  See HPI   Objective:   Today's Vitals: BP  128/82   Pulse 85   Temp 98.3 F (36.8 C) (Oral)   Ht '5\' 3"'$  (1.6 m)   Wt 163 lb (73.9 kg)   SpO2 97%   BMI 28.87 kg/m  Vitals with BMI 03/08/2021 08/16/2020 06/24/2020  Height '5\' 3"'$  '5\' 3"'$  -  Weight 163 lbs 169 lbs -  BMI [id number] -  Systolic 0000000 [id number]  Diastolic 82 84 73  Pulse 85 85 117     Physical Exam Vitals reviewed.  Constitutional:      General: She is not in acute distress.    Appearance: Normal appearance.  HENT:     Head: Normocephalic and atraumatic.     Nose:     Right Turbinates: Swollen.     Left Turbinates: Not enlarged, swollen or pale.     Right Sinus: No maxillary sinus tenderness or frontal sinus tenderness.     Left Sinus: No maxillary sinus tenderness or frontal sinus tenderness.     Mouth/Throat:     Lips: No lesions.     Mouth: Mucous membranes are  moist. No oral lesions.     Tongue: No lesions. Tongue does not deviate from midline.     Palate: No mass.     Pharynx: Oropharynx is clear.  Neck:     Vascular: No carotid bruit.  Cardiovascular:     Rate and Rhythm: Normal rate and regular rhythm.     Pulses: Normal pulses.     Heart sounds: Normal heart sounds.  Pulmonary:     Effort: Pulmonary effort is normal.     Breath sounds: Normal breath sounds.  Lymphadenopathy:     Head:     Right side of head: No submental, submandibular, tonsillar, preauricular, posterior auricular or occipital adenopathy.     Left side of head: No submental, submandibular, tonsillar, preauricular, posterior auricular or occipital adenopathy.     Cervical: No cervical adenopathy.     Upper Body:     Right upper body: No supraclavicular adenopathy.     Left upper body: No supraclavicular adenopathy.  Skin:    General: Skin is warm and dry.  Neurological:     General: No focal deficit present.     Mental Status: She is alert and oriented to person, place, and time.  Psychiatric:        Mood and Affect: Mood normal.        Behavior: Behavior normal.         Judgment: Judgment normal.         Assessment and Plan   1. Dysgeusia   2. Flu vaccine need   3. Fatigue, unspecified type      Plan: 1.,  3.  We will start by checking some blood work.  May consider referral to ENT if all blood work results come back normal. 2.  We will have flu vaccine administered today.   Tests ordered Orders Placed This Encounter  Procedures   Flu Vaccine QUAD 66moIM (Fluarix, Fluzone & Alfiuria Quad PF)   Comprehensive metabolic panel   Vitamin [id number]  Homocysteine   Methylmalonic acid, serum   CBC w/Diff   TSH   T4, free   T3, free      No orders of the defined types were placed in this encounter.   Patient to follow-up in in February or March with her primary care provider for routine follow-up, or sooner as needed.  May have patient follow-up with [facility] pending blood work results.  SAilene Ards NP

## 2021-03-09 ENCOUNTER — Telehealth: Payer: Self-pay | Admitting: Nurse Practitioner

## 2021-03-09 NOTE — Telephone Encounter (Signed)
I called this patient to discuss her lab results. Still awaiting a couple of labs, but her vitamin B12 is in the low range of normal and is <400. I recommended that she try a supplement of 1049mg by mouth of vitamin B12 once a day. I also verified which cancer screenings she is up to date with. She may be due for papsmear and I encouraged her to discuss having this completed at her next follow-up or come in sooner for pap smear if her symptoms of fatigue persist >4-6 weeks despite taking a vitamin B12 supplement. She and her daughter voice their understanding.

## 2021-03-14 LAB — HOMOCYSTEINE: Homocysteine: 8.2 umol/L (ref 0.0–17.2)

## 2021-03-14 LAB — METHYLMALONIC ACID, SERUM: Methylmalonic Acid: <50 nmol/L (ref 0–378)

## 2021-04-02 ENCOUNTER — Encounter: Payer: Self-pay | Admitting: Internal Medicine

## 2021-04-02 ENCOUNTER — Ambulatory Visit (INDEPENDENT_AMBULATORY_CARE_PROVIDER_SITE_OTHER): Payer: 59 | Admitting: Internal Medicine

## 2021-04-02 ENCOUNTER — Other Ambulatory Visit: Payer: Self-pay

## 2021-04-02 DIAGNOSIS — R739 Hyperglycemia, unspecified: Secondary | ICD-10-CM

## 2021-04-02 DIAGNOSIS — M069 Rheumatoid arthritis, unspecified: Secondary | ICD-10-CM | POA: Diagnosis not present

## 2021-04-02 DIAGNOSIS — Z227 Latent tuberculosis: Secondary | ICD-10-CM

## 2021-04-02 DIAGNOSIS — R432 Parageusia: Secondary | ICD-10-CM | POA: Diagnosis not present

## 2021-04-02 MED ORDER — PREDNISONE 20 MG PO TABS: ORAL_TABLET | ORAL | 0 refills | Status: AC

## 2021-04-02 NOTE — Patient Instructions (Signed)
We will put you on higher dose prednisone 40 mg daily for 2 weeks then if you are doing better will reduce to 30 mg daily for 2 weeks then down to 20 mg daily for 2 weeks then down to 10 mg daily until back on the orencia.

## 2021-04-02 NOTE — Progress Notes (Signed)
   Subjective:   Patient ID: Melanie Garrett, female    DOB: [birth year], 65 y.o.   MRN: [id number]  HPI The patient is a 65 YO female coming in for several acute concerns.  Used to be on orencia for RA which helped well and uncontrolled since 2 month ago when she stopped.  Review of Systems  Constitutional:  Positive for fatigue and unexpected weight change.  HENT: Negative.         Change in taste  Eyes: Negative.   Respiratory:  Negative for cough, chest tightness and shortness of breath.   Cardiovascular:  Negative for chest pain, palpitations and leg swelling.  Gastrointestinal:  Positive for nausea. Negative for abdominal distention, abdominal pain, constipation, diarrhea and vomiting.  Musculoskeletal:  Positive for arthralgias, joint swelling and myalgias.  Skin: Negative.   Neurological: Negative.   Psychiatric/Behavioral: Negative.     Objective:  Physical Exam Constitutional:      Appearance: She is well-developed.  HENT:     Head: Normocephalic and atraumatic.  Cardiovascular:     Rate and Rhythm: Normal rate and regular rhythm.  Pulmonary:     Effort: Pulmonary effort is normal. No respiratory distress.     Breath sounds: Normal breath sounds. No wheezing or rales.  Abdominal:     General: Bowel sounds are normal. There is no distension.     Palpations: Abdomen is soft.     Tenderness: There is no abdominal tenderness. There is no rebound.  Musculoskeletal:        General: Swelling and tenderness present.     Cervical back: Normal range of motion.  Skin:    General: Skin is warm and dry.  Neurological:     Mental Status: She is alert and oriented to person, place, and time.     Coordination: Coordination normal.    Vitals:   04/02/21 1514  BP: 132/74  Pulse: 96  Resp: 18  SpO2: 98%  Weight: 155 lb 6.4 oz (70.5 kg)  Height: 5\' 3"  (1.6 m)    This visit occurred during the SARS-CoV-2 public health emergency.  Safety protocols were in place, including  screening questions prior to the visit, additional usage of staff PPE, and extensive cleaning of exam room while observing appropriate contact time as indicated for disinfecting solutions.   Assessment & Plan:

## 2021-04-05 DIAGNOSIS — R432 Parageusia: Secondary | ICD-10-CM | POA: Insufficient documentation

## 2021-04-05 DIAGNOSIS — Z227 Latent tuberculosis: Secondary | ICD-10-CM | POA: Insufficient documentation

## 2021-04-05 DIAGNOSIS — M069 Rheumatoid arthritis, unspecified: Secondary | ICD-10-CM

## 2021-04-05 HISTORY — DX: Rheumatoid arthritis, unspecified: M06.9

## 2021-04-05 NOTE — Assessment & Plan Note (Signed)
Currently on treatment and they state red capsules, rifampin sounds familiar and does come in red capsules. She is advised to continue with this treatment and recommendation is for 4 months of therapy. She is about 2 months into treatment currently per their recollection.

## 2021-04-05 NOTE — Assessment & Plan Note (Signed)
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 She is having flare. On predinsone 10 mg daily for 1 week without resolution. Will increase to 40 mg daily 2 weeks then taper 10 mg every 2 weeks and maintain 10 mg daily until back on orencia. She is taking methotrexate and arava currently without good control.

## 2021-04-05 NOTE — Assessment & Plan Note (Signed)
Counseled that sugar levels could increase with the prednisone treatment for her RA flare.

## 2021-04-05 NOTE — Assessment & Plan Note (Signed)
After history obtained that she is currently being treated for latent TB (she may be on rifampin she is uncertain) this is likely the etiology. She is 2 months into treatment and recommendation is for 4 months so likely she will need to continue on this. Advised to try different foods and textures to see which foods she can tolerate and use nutritional supplement.

## 2021-05-04 ENCOUNTER — Other Ambulatory Visit: Payer: Self-pay | Admitting: Family Medicine

## 2021-05-04 DIAGNOSIS — M159 Polyosteoarthritis, unspecified: Secondary | ICD-10-CM

## 2021-07-06 ENCOUNTER — Other Ambulatory Visit: Payer: Self-pay

## 2021-07-06 ENCOUNTER — Ambulatory Visit (INDEPENDENT_AMBULATORY_CARE_PROVIDER_SITE_OTHER): Payer: Medicare Other | Admitting: Internal Medicine

## 2021-07-06 ENCOUNTER — Encounter: Payer: Self-pay | Admitting: Internal Medicine

## 2021-07-06 VITALS — BP 142/86 | HR 90 | Temp 98.2°F | Ht 63.0 in | Wt 152.8 lb

## 2021-07-06 DIAGNOSIS — M19072 Primary osteoarthritis, left ankle and foot: Secondary | ICD-10-CM

## 2021-07-06 DIAGNOSIS — M19071 Primary osteoarthritis, right ankle and foot: Secondary | ICD-10-CM | POA: Diagnosis not present

## 2021-07-06 DIAGNOSIS — M069 Rheumatoid arthritis, unspecified: Secondary | ICD-10-CM | POA: Diagnosis not present

## 2021-07-06 DIAGNOSIS — M25511 Pain in right shoulder: Secondary | ICD-10-CM | POA: Diagnosis not present

## 2021-07-06 DIAGNOSIS — M159 Polyosteoarthritis, unspecified: Secondary | ICD-10-CM | POA: Diagnosis not present

## 2021-07-06 DIAGNOSIS — M25512 Pain in left shoulder: Secondary | ICD-10-CM

## 2021-07-06 MED ORDER — METHYLPREDNISOLONE ACETATE 80 MG/ML IJ SUSP
80.0000 mg | Freq: Once | INTRAMUSCULAR | Status: AC
  Administered 2021-07-06: 80 mg via INTRAMUSCULAR

## 2021-07-06 MED ORDER — ABATACEPT 250 MG IV SOLR: INTRAVENOUS | 12 refills | Status: DC

## 2021-07-06 MED ORDER — PREDNISONE 10 MG PO TABS: ORAL_TABLET | ORAL | 0 refills | Status: DC

## 2021-07-06 MED ORDER — TRAMADOL HCL 50 MG PO TABS: 50.0000 mg | ORAL_TABLET | Freq: Four times a day (QID) | ORAL | 3 refills | Status: DC | PRN

## 2021-07-06 NOTE — Patient Instructions (Signed)
You had the steroid shot today (depomedrol 80 mg)  Please take all new medication as prescribed - the tramadol, and prednisone for pain  You will be contacted regarding the referral for: Rheumatology, and Sports Medicine for your shoulders  Please continue all other medications as before  Please have the pharmacy call with any other refills you may need.  Please continue your efforts at being more active, low cholesterol diet, and weight control..  Please keep your appointments with your specialists as you may have planned

## 2021-07-06 NOTE — Progress Notes (Signed)
Patient ID: Gevena Cotton, female   DOB: 05-Mar-1956, 66 y.o.   MRN: [id number]        Chief Complaint: follow up RA, bilateral shoulder pain, left ankle pain and right first MTP pain       HPI:  LESHA JAGER is a 66 y.o. female here with bilateral hand and feet pain similar to last visit, despite change back to [place] weekly for 3 wks after finishing rifampin on temporary MTX/arava;  also incidentally with other new pains - has bilateral shoulder pain with reduced ROM for 1 mo left more than right, mild to mod constant, worse to abduct the arms.  Also with 1 wk onset new left ankle pain swelling, mild to mod, worse to walk, better to sit.  Also similar right first MTP pain swelling similar fashion.   Pt denies fever, wt loss, night sweats, loss of appetite, or other constitutional symptoms  Pt denies chest pain, increased sob or doe, wheezing, orthopnea, PND, increased LE swelling, palpitations, dizziness or syncope.   Pt denies polydipsia, polyuria, or new focal neuro s/s.          Wt Readings from Last 3 Encounters:  07/06/21 152 lb 12.8 oz (69.3 kg)  04/02/21 155 lb 6.4 oz (70.5 kg)  03/08/21 163 lb (73.9 kg)   BP Readings from Last 3 Encounters:  07/06/21 (!) 142/86  04/02/21 132/74  03/08/21 128/82         Past Medical History:  Diagnosis Date   Osteoarthritis 02/09/2020   RA (rheumatoid arthritis) ([place]) 04/05/2021   History reviewed. No pertinent surgical history.  reports that she has never smoked. She has never used smokeless tobacco. She reports that she does not drink alcohol and does not use drugs. family history includes Arthritis in her mother. No Known Allergies Current Outpatient Medications on File Prior to Visit  Medication Sig Dispense Refill   folic acid (FOLVITE) 1 MG tablet Take 1 mg by mouth daily.     potassium chloride SA (KLOR-CON) 20 MEQ tablet Take 1 tablet (20 mEq total) by mouth daily. 90 tablet 0   Ubrogepant (UBRELVY) 100 MG TABS Take 1 tablet by mouth  daily as needed. 30 tablet 1   No current facility-administered medications on file prior to visit.        ROS:  All others reviewed and negative.  Objective        PE:  BP (!) 142/86 (BP Location: Right Arm, Patient Position: Sitting, Cuff Size: Large)    Pulse 90    Temp 98.2 F (36.8 C) (Oral)    Ht 5\' 3"  (1.6 m)    Wt 152 lb 12.8 oz (69.3 kg)    SpO2 99%    BMI 27.07 kg/m                 Constitutional: Pt appears in NAD               HENT: Head: NCAT.                Right Ear: External ear normal.                 Left Ear: External ear normal.                Eyes: . Pupils are equal, round, and reactive to light. Conjunctivae and EOM are normal               Nose: without d/c or  deformity               Neck: Neck supple. Gross normal ROM               Cardiovascular: Normal rate and regular rhythm.                 Pulmonary/Chest: Effort normal and breath sounds without rales or wheezing.                Abd:  Soft, NT, ND, + BS, no organomegaly               Neurological: Pt is alert. At baseline orientation, motor grossly intact               Skin: Skin is warm. No rashes, no other new lesions, LE edema - trace distal left foot with 1+ warm tender swelling left ankle; right fitst MTP also mild tender, swelling; also left shoulder NT but pain on reduced ROM to 90 degrees, also right shoulder NT but with pain and redeuced ROM to 100 degrees               Psychiatric: Pt behavior is normal without agitation   Micro: none  Cardiac tracings I have personally interpreted today:  none  Pertinent Radiological findings (summarize): none   Lab Results  Component Value Date   WBC 6.2 03/08/2021   HGB 12.4 03/08/2021   HCT 38.0 03/08/2021   PLT 244.0 03/08/2021   GLUCOSE 90 03/08/2021   CHOL 212 (H) 08/16/2020   TRIG 107.0 08/16/2020   HDL 66.00 08/16/2020   LDLCALC 124 (H) 08/16/2020   ALT 16 03/08/2021   AST 22 03/08/2021   NA 139 03/08/2021   K 3.4 (L) 03/08/2021   CL 103  03/08/2021   CREATININE 0.53 03/08/2021   BUN 8 03/08/2021   CO2 28 03/08/2021   TSH 2.41 03/08/2021   HGBA1C 5.9 08/16/2020   Assessment/Plan:  TARITA DESHMUKH is a 66 y.o. Black or African American [2] female with  has a past medical history of Osteoarthritis (02/09/2020) and RA (rheumatoid arthritis) ([place]) (04/05/2021).  RA (rheumatoid arthritis) ([facility]) With mild to mod flare, for prednisone course,, continue other same tx, for referral new rheumatology per pt  Arthritis of first metatarsophalangeal (MTP) joint of right foot C/w OA vs gout - for prednisone as above,  to f/u any worsening symptoms or concerns  Arthritis of left ankle C/w OA vs gout - for prednisone asd ,  to f/u any worsening symptoms or concerns  Bilateral shoulder pain Recent worsening, for tramadol prn refill,  for sport medicine referral  - ? Rotate cuff disease  Followup: Return if symptoms worsen or fail to improve.  Cathlean Cower, MD 07/07/2021 4:35 PM [facility]

## 2021-07-07 ENCOUNTER — Encounter: Payer: Self-pay | Admitting: Internal Medicine

## 2021-07-07 NOTE — Assessment & Plan Note (Signed)
With mild to mod flare, for prednisone course,, continue other same tx, for referral new rheumatology per pt

## 2021-07-07 NOTE — Assessment & Plan Note (Signed)
C/w OA vs gout - for prednisone as above,  to f/u any worsening symptoms or concerns

## 2021-07-07 NOTE — Assessment & Plan Note (Addendum)
Recent worsening, for tramadol prn refill,  for sport medicine referral  - ? Rotate cuff disease

## 2021-07-07 NOTE — Assessment & Plan Note (Signed)
C/w OA vs gout - for prednisone asd ,  to f/u any worsening symptoms or concerns

## 2021-07-12 ENCOUNTER — Encounter: Payer: Self-pay | Admitting: Family Medicine

## 2021-07-12 ENCOUNTER — Other Ambulatory Visit: Payer: Self-pay

## 2021-07-12 ENCOUNTER — Ambulatory Visit (INDEPENDENT_AMBULATORY_CARE_PROVIDER_SITE_OTHER): Payer: Medicare Other | Admitting: Internal Medicine

## 2021-07-12 ENCOUNTER — Ambulatory Visit: Payer: Self-pay

## 2021-07-12 ENCOUNTER — Ambulatory Visit (INDEPENDENT_AMBULATORY_CARE_PROVIDER_SITE_OTHER): Payer: Medicare Other | Admitting: Family Medicine

## 2021-07-12 ENCOUNTER — Encounter: Payer: Self-pay | Admitting: Internal Medicine

## 2021-07-12 ENCOUNTER — Ambulatory Visit (INDEPENDENT_AMBULATORY_CARE_PROVIDER_SITE_OTHER): Payer: Medicare Other

## 2021-07-12 VITALS — BP 136/84 | HR 64 | Ht 63.0 in | Wt 155.0 lb

## 2021-07-12 VITALS — BP 136/84 | HR 64 | Temp 97.7°F | Ht 63.0 in | Wt 155.0 lb

## 2021-07-12 DIAGNOSIS — M05741 Rheumatoid arthritis with rheumatoid factor of right hand without organ or systems involvement: Secondary | ICD-10-CM

## 2021-07-12 DIAGNOSIS — M25512 Pain in left shoulder: Secondary | ICD-10-CM

## 2021-07-12 DIAGNOSIS — Z23 Encounter for immunization: Secondary | ICD-10-CM

## 2021-07-12 DIAGNOSIS — I10 Essential (primary) hypertension: Secondary | ICD-10-CM | POA: Diagnosis not present

## 2021-07-12 DIAGNOSIS — M05742 Rheumatoid arthritis with rheumatoid factor of left hand without organ or systems involvement: Secondary | ICD-10-CM

## 2021-07-12 DIAGNOSIS — E876 Hypokalemia: Secondary | ICD-10-CM | POA: Diagnosis not present

## 2021-07-12 DIAGNOSIS — M25511 Pain in right shoulder: Secondary | ICD-10-CM

## 2021-07-12 LAB — BASIC METABOLIC PANEL
BUN: 16 mg/dL (ref 6–23)
CO2: 29 mEq/L (ref 19–32)
Calcium: 9.4 mg/dL (ref 8.4–10.5)
Chloride: 103 mEq/L (ref 96–112)
Creatinine, Ser: 0.52 mg/dL (ref 0.40–1.20)
GFR: 97.73 mL/min (ref >60.00)
Glucose, Bld: 92 mg/dL (ref 70–99)
Potassium: 4.2 mEq/L (ref 3.5–5.1)
Sodium: 138 mEq/L (ref 135–145)

## 2021-07-12 LAB — MAGNESIUM: Magnesium: 2.1 mg/dL (ref 1.5–2.5)

## 2021-07-12 NOTE — Progress Notes (Signed)
Subjective:  Patient ID: Melanie Garrett, female    DOB: [birth year]  Age: 66 y.o. MRN: [id number]  CC: Hypertension  This visit occurred during the SARS-CoV-2 public health emergency.  Safety protocols were in place, including screening questions prior to the visit, additional usage of staff PPE, and extensive cleaning of exam room while observing appropriate contact time as indicated for disinfecting solutions.    HPI [name] presents for f/up -  She is active and denies chest pain, shortness of breath, diaphoresis, dizziness, lightheadedness, or edema.  She is taking the potassium supplement.  Outpatient Medications Prior to Visit  Medication Sig Dispense Refill   abatacept (ORENCIA) 250 MG injection Use as directed once weekly 1 each 12   folic acid (FOLVITE) 1 MG tablet Take 1 mg by mouth daily.     potassium chloride SA (KLOR-CON) 20 MEQ tablet Take 1 tablet (20 mEq total) by mouth daily. 90 tablet 0   predniSONE (DELTASONE) 10 MG tablet 4 tab by mouth x 3 days,3 tab x 3 days, 2 tab x 3days, 1 tab x 3 then stop 30 tablet 0   traMADol (ULTRAM) 50 MG tablet Take 1 tablet (50 mg total) by mouth every 6 (six) hours as needed. 75 tablet 3   Ubrogepant (UBRELVY) 100 MG TABS Take 1 tablet by mouth daily as needed. 30 tablet 1   No facility-administered medications prior to visit.    ROS Review of Systems  Constitutional:  Negative for diaphoresis and fatigue.  HENT: Negative.    Eyes: Negative.   Respiratory:  Negative for cough, chest tightness, shortness of breath and wheezing.   Cardiovascular:  Negative for chest pain, palpitations and leg swelling.  Gastrointestinal:  Negative for abdominal pain, constipation, diarrhea, nausea and vomiting.  Endocrine: Negative.   Genitourinary: Negative.  Negative for difficulty urinating.  Musculoskeletal:  Positive for arthralgias. Negative for myalgias.  Skin: Negative.   Neurological: Negative.  Negative for dizziness, weakness  and light-headedness.  Hematological:  Negative for adenopathy. Does not bruise/bleed easily.  Psychiatric/Behavioral: Negative.     Objective:  BP 136/84 (BP Location: Right Arm, Patient Position: Sitting, Cuff Size: Large)    Pulse 64    Temp 97.7 F (36.5 C) (Oral)    Ht 5\' 3"  (1.6 m)    Wt 155 lb (70.3 kg)    SpO2 97%    BMI 27.46 kg/m   BP Readings from Last 3 Encounters:  07/12/21 136/84  07/12/21 136/84  07/06/21 (!) 142/86    Wt Readings from Last 3 Encounters:  07/12/21 155 lb (70.3 kg)  07/12/21 155 lb (70.3 kg)  07/06/21 152 lb 12.8 oz (69.3 kg)    Physical Exam Vitals reviewed.  Eyes:     General: No scleral icterus.    Conjunctiva/sclera: Conjunctivae normal.  Cardiovascular:     Rate and Rhythm: Normal rate and regular rhythm.     Heart sounds: No murmur heard. Pulmonary:     Effort: Pulmonary effort is normal.     Breath sounds: No stridor. No wheezing, rhonchi or rales.  Abdominal:     General: Abdomen is flat.     Palpations: There is no mass.     Tenderness: There is no abdominal tenderness. There is no guarding.     Hernia: No hernia is present.  Musculoskeletal:     Cervical back: Neck supple.     Right lower leg: No edema.     Left lower leg: No edema.  Lymphadenopathy:     Cervical: No cervical adenopathy.  Skin:    General: Skin is warm.  Neurological:     General: No focal deficit present.     Mental Status: She is alert. Mental status is at baseline.  Psychiatric:        Mood and Affect: Mood normal.        Behavior: Behavior normal.    Lab Results  Component Value Date   WBC 6.2 03/08/2021   HGB 12.4 03/08/2021   HCT 38.0 03/08/2021   PLT 244.0 03/08/2021   GLUCOSE 92 07/12/2021   CHOL 212 (H) 08/16/2020   TRIG 107.0 08/16/2020   HDL 66.00 08/16/2020   LDLCALC 124 (H) 08/16/2020   ALT 16 03/08/2021   AST 22 03/08/2021   NA 138 07/12/2021   K 4.2 07/12/2021   CL 103 07/12/2021   CREATININE 0.52 07/12/2021   BUN 16  07/12/2021   CO2 29 07/12/2021   TSH 2.41 03/08/2021   HGBA1C 5.9 08/16/2020    No results found.  Assessment & Plan:   [name] was seen today for hypertension.  Diagnoses and all orders for this visit:  Hypokalemia due to inadequate potassium intake- Her potassium level is normal now.  Will continue the current potassium supplement. -     Basic metabolic panel; Future -     Magnesium; Future -     Magnesium -     Basic metabolic panel  Need for vaccination -     Pneumococcal conjugate vaccine 20-valent  Primary hypertension- She has not achieved her blood pressure goal of 130/80.  I recommended that she improve her lifestyle modifications.   I am having [name] maintain her folic acid, potassium chloride SA, Ubrelvy, traMADol, abatacept, and predniSONE.  No orders of the defined types were placed in this encounter.    Follow-up: Return in about 6 months (around 01/09/2022).  [name], MD

## 2021-07-12 NOTE — Patient Instructions (Addendum)
Good to see you today.  Dr [name] office: [phone]  Please get an Xray today before you leave.  Follow-up: if you want a shoulder injection

## 2021-07-12 NOTE — Progress Notes (Signed)
I, [name], LAT, ATC, am serving as scribe for Dr. Lynne Leader.  Melanie Garrett is a 66 y.o. female who presents to [place] at [facility] today for bilat shoulder pain. 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 Pt was previously seen by Dr. [name] on 03/01/20 for polyarthralgia; R wrist, L shoulder and bilat feet. 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 Based on the findings of her labs, pt was referred to rheumatology, however she has not yet had a visit. Today, pt c/o bilat shoulder pain R >L, x 2 weeks w/ no known MOI. Pt locates pain to her B superior and ant shoulder w/ radiating pain into her upper arms, stopping at the elbow.  She denies any mechanical symptoms or paresthesias.  Aggravating factors include end-range overhead ROM and functional IR.   On the 13th patient was referred to Dr. Estanislado Pandy for rheumatology care.  Her old rheumatology care was previously at [place] by Marella Chimes, NP.  She has not been called yet to schedule an appointment with Dr. Estanislado Pandy. (After reviewing referral notes apparently we are waiting on record release for medical records to be sent.)   Dx testing: 03/08/21 Labs  03/01/20 Labs 02/09/20 L shoulder XR  05/03/13 R humerus XR  12/07/12 R shoulder XR  Pertinent review of systems: No fevers or chills  Relevant historical information: Rheumatoid arthritis.   Exam:  BP 136/84 (BP Location: Right Arm, Patient Position: Sitting, Cuff Size: Large)    Pulse 64    Ht 5\' 3"  (1.6 m)    Wt 155 lb (70.3 kg)    SpO2 97%    BMI 27.46 kg/m  General: Well Developed, well nourished, and in no acute distress.   MSK: Bilateral shoulders: Normal. Not particularly tender. Normal motion. Intact strength. Minimally positive impingement testing bilaterally.    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  No acute fractures.  Left shoulder: Mild to moderate glenohumeral DJD.  Moderate AC DJD.  No acute fractures.  Await formal radiology review    Assessment and Plan: 66 y.o. female with bilateral shoulder pain thought to be due to DJD and possible RA flare.  Patient is doing very well currently with a prednisone Dosepak prescribed by her PCP.  Plan to obtain x-rays bilaterally and do some watchful waiting.  If symptoms return once the steroids and she can return to clinic and I can proceed with steroid injections.  But for now watchful waiting.  Spent quite a lot of time today figuring out who her old rheumatology office was in coordinating care for her new rheumatology referral to Dr. Estanislado Pandy.  We did some research and determined that her previous rheumatologist was Marella Chimes nurse practitioner at [facility] and her new rheumatology referral is to Dr. Estanislado Pandy.  Apparently there is a hold-up waiting on consent for release of information from old rheumatology to do rheumatology prefer the appointment will be made.  We got her to sign a consent for release of information we will fax it to [facility] so records should be sent directly to Dr. [name] office.  Additionally I provided Melanie Garrett with a phone number to Dr. [name] office directly so that she can call and schedule an appointment herself.    PDMP  not reviewed this encounter. Orders Placed This Encounter  Procedures   [place] LIMITED JOINT SPACE STRUCTURES UP BILAT(NO LINKED CHARGES)    Order Specific Question:   Reason for Exam (SYMPTOM  OR DIAGNOSIS REQUIRED)    Answer:   B shoulder pain    Order Specific Question:   Preferred imaging location?    Answer:   [place]   DG Shoulder Left    Standing Status:   Future    Number of Occurrences:   1     Standing Expiration Date:   07/12/2022    Order Specific Question:   Reason for Exam (SYMPTOM  OR DIAGNOSIS REQUIRED)    Answer:   bilateral shoulder pain    Order Specific Question:   Preferred imaging location?    Answer:   [facility]   DG Shoulder Right    Standing Status:   Future    Number of Occurrences:   1    Standing Expiration Date:   07/12/2022    Order Specific Question:   Reason for Exam (SYMPTOM  OR DIAGNOSIS REQUIRED)    Answer:   bilateral shoulder pain    Order Specific Question:   Preferred imaging location?    Answer:   [facility]   No orders of the defined types were placed in this encounter.    Discussed warning signs or symptoms. Please see discharge instructions. Patient expresses understanding.   The above documentation has been reviewed and is accurate and complete Lynne Leader, M.D.  Total encounter time 30 minutes including face-to-face time with the patient and, reviewing past medical record, and charting on the date of service.   Discussion, medical research and coordination of care.

## 2021-07-12 NOTE — Patient Instructions (Signed)
Hypokalemia Hypokalemia means that the amount of potassium in the blood is lower than normal. Potassium is a chemical (electrolyte) that helps regulate the amount of fluid in the body. It also stimulates muscle tightening (contraction) and helps nerves work properly. Normally, most of the body's potassium is inside cells, and only a very small amount is in the blood. Because the amount in the blood is so small, minor changes to potassium levels in the blood can be life-threatening. What are the causes? This condition may be caused by: Antibiotic medicine. Diarrhea or vomiting. Taking too much of a medicine that helps you have a bowel movement (laxative) can cause diarrhea and lead to hypokalemia. Chronic kidney disease (CKD). Medicines that help the body get rid of excess fluid (diuretics). Eating disorders, such as bulimia. Low magnesium levels in the body. Sweating a lot. What are the signs or symptoms? Symptoms of this condition include: Weakness. Constipation. Fatigue. Muscle cramps. Mental confusion. Skipped heartbeats or irregular heartbeat (palpitations). Tingling or numbness. How is this diagnosed? This condition is diagnosed with a blood test. How is this treated? This condition may be treated by: Taking potassium supplements by mouth. Adjusting the medicines that you take. Eating more foods that contain a lot of potassium. If your potassium level is very low, you may need to get potassium through an IV and be monitored in the hospital. Follow these instructions at home:  Take over-the-counter and prescription medicines only as told by your health care provider. This includes vitamins and supplements. Eat a healthy diet. A healthy diet includes fresh fruits and vegetables, whole grains, healthy fats, and lean proteins. If instructed, eat more foods that contain a lot of potassium. This includes: Nuts, such as peanuts and pistachios. Seeds, such as sunflower seeds and  pumpkin seeds. Peas, lentils, and lima beans. Whole grain and bran cereals and breads. Fresh fruits and vegetables, such as apricots, avocado, bananas, cantaloupe, kiwi, oranges, tomatoes, asparagus, and potatoes. Orange juice. Tomato juice. Red meats. Yogurt. Keep all follow-up visits as told by your health care provider. This is important. Contact a health care provider if you: Have weakness that gets worse. Feel your heart pounding or racing. Vomit. Have diarrhea. Have diabetes (diabetes mellitus) and you have trouble keeping your blood sugar (glucose) in your target range. Get help right away if you: Have chest pain. Have shortness of breath. Have vomiting or diarrhea that lasts for more than 2 days. Faint. Summary Hypokalemia means that the amount of potassium in the blood is lower than normal. This condition is diagnosed with a blood test. Hypokalemia may be treated by taking potassium supplements, adjusting the medicines that you take, or eating more foods that are high in potassium. If your potassium level is very low, you may need to get potassium through an IV and be monitored in the hospital. This information is not intended to replace advice given to you by your health care provider. Make sure you discuss any questions you have with your health care provider. Document Revised: 01/20/2018 Document Reviewed: 01/21/2018 Elsevier Patient Education  2022 Elsevier Inc.  

## 2021-07-13 NOTE — Progress Notes (Signed)
Right shoulder x-ray shows arthritis changes of the small joint at the top of the shoulder.  This is the acromioclavicular joint.  The main shoulder joint does not have arthritis.

## 2021-07-13 NOTE — Progress Notes (Signed)
Left shoulder x-ray shows arthritis changes

## 2021-08-20 NOTE — Progress Notes (Signed)
I, Melanie Garrett, LAT, ATC, am serving as scribe for Dr. Lynne Leader.  Melanie Garrett is a 66 y.o. female who presents to [place] at [facility] today for f/u of B shoulder pain thought to be due to DJD and possible RA flare.  She was last seen by Dr. [name] on 07/12/21 and was doing better after being prescribed a prednisone dosepack by her PCP.  She was advised to con't w/ watchful waiting and to return for steroid injections if needed.  She was also advised to contact Dr. [name] office.  Today, pt reports that her B shoulder pain has increased.  She is also reporting B hand and foot pain and swelling and B knee pain.  She is scheduled to see Dr.[name] in April and is on a call-back list to hopefully get an appt sooner.  Diagnostic testing: R and L shoulder XR- 07/12/21  Pertinent review of systems: No fevers or chills  Relevant historical information: Rheumatoid arthritis   Exam:  BP 120/78 (BP Location: Right Arm, Patient Position: Sitting, Cuff Size: Normal)    Pulse (!) 107    Ht 5\' 3"  (1.6 m)    Wt 151 lb 12.8 oz (68.9 kg)    SpO2 90%    BMI 26.89 kg/m  General: Well Developed, well nourished, and in no acute distress.   MSK: Bilateral shoulder mild effusion decreased range of motion with pain.  Hands bilaterally significant synovitis across MCPs. Ankle effusions present bilaterally.    Lab and Radiology Results  Procedure: Real-time Ultrasound Guided Injection of right shoulder glenohumeral joint posterior approach Device: Philips Affiniti 50G Images permanently stored and available for review in PACS Verbal informed consent obtained.  Discussed risks and benefits of procedure. Warned about infection bleeding damage to structures skin hypopigmentation and fat atrophy among others. Patient expresses understanding and agreement Time-out conducted.   Noted no overlying erythema, induration, or other signs of local infection.   Skin prepped in a  sterile fashion.   Local anesthesia: Topical Ethyl chloride.   With sterile technique and under real time ultrasound guidance: 40 mg of Kenalog and 2 mL of Marcaine injected into glenohumeral joint. Fluid seen entering the joint capsule.   Completed without difficulty   Pain immediately resolved suggesting accurate placement of the medication.   Advised to call if fevers/chills, erythema, induration, drainage, or persistent bleeding.   Images permanently stored and available for review in the ultrasound unit.  Impression: Technically successful ultrasound guided injection.    Procedure: Real-time Ultrasound Guided Injection of left shoulder glenohumeral joint posterior approach Device: Philips Affiniti 50G Images permanently stored and available for review in PACS Verbal informed consent obtained.  Discussed risks and benefits of procedure. Warned about infection bleeding damage to structures skin hypopigmentation and fat atrophy among others. Patient expresses understanding and agreement Time-out conducted.   Noted no overlying erythema, induration, or other signs of local infection.   Skin prepped in a sterile fashion.   Local anesthesia: Topical Ethyl chloride.   With sterile technique and under real time ultrasound guidance: 40 mg of Kenalog and 2 mL of Marcaine injected into shoulder joint. Fluid seen entering the joint capsule.   Completed without difficulty   Pain immediately resolved suggesting accurate placement of the medication.   Advised to call if fevers/chills, erythema, induration, drainage, or persistent bleeding.   Images permanently stored and available for review in the ultrasound unit.  Impression: Technically successful ultrasound guided injection.  Assessment and Plan: 66 y.o. female with  Bilateral shoulder pain.  This occurs in the setting of rheumatoid arthritis that does not look to be controlled.  She is currently taking [place] this alone does not seem  to be effective.  She has evidence of synovitis and joint effusions across multiple joints including hands and ankles and knees bilaterally.  I think a systemic treatment is better.  Fortunately I do not think that oral steroids for the next 6 weeks until she can schedule with Dr. Estanislado Pandy is a good idea.  I am hopeful that she will be able to get in with her sooner but until then we will proceed with bilateral steroid injections as above and check back after her first visit with Dr. Estanislado Pandy.  Happy to see her sooner if needed.   PDMP not reviewed this encounter. Orders Placed This Encounter  Procedures   [place] LIMITED JOINT SPACE STRUCTURES UP BILAT(NO LINKED CHARGES)    Order Specific Question:   Reason for Exam (SYMPTOM  OR DIAGNOSIS REQUIRED)    Answer:   B shoulder pain    Order Specific Question:   Preferred imaging location?    Answer:   [place]   No orders of the defined types were placed in this encounter.    Discussed warning signs or symptoms. Please see discharge instructions. Patient expresses understanding.   The above documentation has been reviewed and is accurate and complete Lynne Leader, M.D.

## 2021-08-21 ENCOUNTER — Ambulatory Visit (INDEPENDENT_AMBULATORY_CARE_PROVIDER_SITE_OTHER): Payer: Medicare Other | Admitting: Family Medicine

## 2021-08-21 ENCOUNTER — Encounter: Payer: Self-pay | Admitting: Family Medicine

## 2021-08-21 ENCOUNTER — Other Ambulatory Visit: Payer: Self-pay

## 2021-08-21 ENCOUNTER — Ambulatory Visit: Payer: Self-pay

## 2021-08-21 VITALS — BP 120/78 | HR 107 | Ht 63.0 in | Wt 151.8 lb

## 2021-08-21 DIAGNOSIS — M25511 Pain in right shoulder: Secondary | ICD-10-CM

## 2021-08-21 DIAGNOSIS — M05741 Rheumatoid arthritis with rheumatoid factor of right hand without organ or systems involvement: Secondary | ICD-10-CM | POA: Diagnosis not present

## 2021-08-21 DIAGNOSIS — M05742 Rheumatoid arthritis with rheumatoid factor of left hand without organ or systems involvement: Secondary | ICD-10-CM

## 2021-08-21 DIAGNOSIS — M25512 Pain in left shoulder: Secondary | ICD-10-CM | POA: Diagnosis not present

## 2021-08-21 NOTE — Patient Instructions (Addendum)
Good to see you today.  You had B shoulder injections.  Call or go to the ER if you develop a large red swollen joint with extreme pain or oozing puss.   Follow-up: after your appt w/ Dr. Estanislado Pandy so around 10/11/21 but can be sooner if your appt w/ Dr. Estanislado Pandy gets moved up.

## 2021-08-24 DIAGNOSIS — Z1231 Encounter for screening mammogram for malignant neoplasm of breast: Secondary | ICD-10-CM | POA: Diagnosis not present

## 2021-08-24 LAB — HM MAMMOGRAPHY

## 2021-09-26 NOTE — Progress Notes (Signed)
? ?Office Visit Note ? ?Patient: Melanie Garrett             ?Date of Birth: 1955/07/05           ?MRN: [id number]             ?PCP: Janith Lima, MD ?Referring: Biagio Borg, MD ?Visit Date: 10/09/2021 ?Occupation: '@GUAROCC'$ @ ? ?Subjective:  ?Pain and swelling in multiple joints ? ?History of Present Illness: Melanie Garrett is a 66 y.o. female seen in consultation per request of her PCP.  According to the patient and her daughter her symptoms a started in August 2021 with pain and swelling in her right hand which gradually spread to her bilateral hands and bilateral wrist joints.  Gradually the swelling and discomfort moved to all of her joints.  She was initially evaluated by her PCP and then a sports medicine doctor.  She was also referred to [facility] rheumatology and was under care of Ms. Pearline Cables.  After Ms. Pearline Cables left she was seeing Dr. Amil Amen.  She was a started on prednisone and methotrexate.  Prednisone was gradually tapered.  According to her the overall methotrexate was not effective and she was switched to subcutaneous methotrexate.  Subcutaneous methotrexate was also not effective.  She was switched to leflunomide.  Later subcutaneous Orencia was added.  She did quite well on the combination of leflunomide and subcutaneous Orencia.  Due to outstanding balance she was discharged from [facility] rheumatology.  She continues to receive Orencia.  Leflunomide was discontinued.   ?She was diagnosed with latent tuberculosis.  According to patient's daughter she took the treatment for 1 month but due to the hives it was discontinued.  But later Dr. Amil Amen placed her back on the treatment for another 3 months and she finished a total 4 months of treatment.  She was given a note by the health department for the completion of the treatment.  She continued Orencia after being discharged from [facility] rheumatology but she has been off leflunomide.  She has been having increased pain and swelling in multiple  joints.  She describes pain and discomfort in her bilateral shoulders, bilateral wrist and hands.  She also has pain and swelling in her bilateral knee joints ankles and her feet.  There is no family history of rheumatoid arthritis or autoimmune disease.  She is gravida 3, para 3. ? ?Activities of Daily Living:  ?Patient reports morning stiffness for all day. ?Patient Reports nocturnal pain.  ?Difficulty dressing/grooming: Reports ?Difficulty climbing stairs: Reports ?Difficulty getting out of chair: Reports ?Difficulty using hands for taps, buttons, cutlery, and/or writing: Reports ? ?Review of Systems  ?Constitutional:  Positive for fatigue.  ?HENT:  Negative for mouth sores, mouth dryness and nose dryness.   ?Eyes:  Negative for pain, itching and dryness.  ?Respiratory:  Positive for shortness of breath. Negative for difficulty breathing.   ?Cardiovascular:  Negative for chest pain and palpitations.  ?Gastrointestinal:  Negative for blood in stool, constipation and diarrhea.  ?Endocrine: Negative for increased urination.  ?Genitourinary:  Negative for difficulty urinating.  ?Musculoskeletal:  Positive for joint pain, joint pain, joint swelling, myalgias, morning stiffness, muscle tenderness and myalgias.  ?Skin:  Negative for color change, rash and redness.  ?Allergic/Immunologic: Negative for susceptible to infections.  ?Neurological:  Negative for dizziness, numbness, headaches, memory loss and weakness.  ?Hematological:  Negative for bruising/bleeding tendency.  ?Psychiatric/Behavioral:  Negative for confusion.   ? ?PMFS History:  ?Patient Active Problem List  ?  Diagnosis Date Noted  ? Arthritis of first metatarsophalangeal (MTP) joint of right foot 07/06/2021  ? RA (rheumatoid arthritis) ([place]) 04/05/2021  ? TB lung, latent 04/05/2021  ? Intractable migraine without aura and without status migrainosus 08/21/2020  ? Screening for cervical cancer 08/21/2020  ? Screen for colon cancer 08/16/2020  ? Visit for  screening mammogram 08/16/2020  ? Hypokalemia due to inadequate potassium intake 08/16/2020  ? Osteoarthritis 02/09/2020  ? Obesity (BMI 30.0-34.9) 06/29/2018  ? 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 Routine general medical examination at a health care facility 09/02/2016  ? Eczema, dyshidrotic 02/02/2015  ?  ?Past Medical History:  ?Diagnosis Date  ? Osteoarthritis 02/09/2020  ? RA (rheumatoid arthritis) ([place]) 04/05/2021  ?  ?Family History  ?Problem Relation Age of Onset  ? Arthritis Mother   ? Lupus Sister   ? Lupus Brother   ? Seizures Brother   ? Hypertension Daughter   ? Arthritis Daughter   ? Hypertension Daughter   ? Arthritis Daughter   ? Alcohol abuse Neg Hx   ? Cancer Neg Hx   ? Diabetes Neg Hx   ? Drug abuse Neg Hx   ? Early death Neg Hx   ? Heart disease Neg Hx   ? Stroke Neg Hx   ? ?Past Surgical History:  ?Procedure Laterality Date  ? KNEE SURGERY Left   ? acl repair  ? ?Social History  ? ?Social History Narrative  ? Not on file  ? ?Immunization History  ?Administered Date(s) Administered  ? Influenza,inj,Quad PF,6+ Mos 06/29/2018, 03/08/2021  ? Influenza-Unspecified 02/23/2015, 03/24/2016, 03/24/2017  ? PNEUMOCOCCAL CONJUGATE-20 07/12/2021  ? Td 04/27/2010  ? Tdap 05/04/2013  ?  ? ?Objective: ?Vital Signs: BP 117/77 (BP Location: Right Arm, Patient Position: Sitting, Cuff Size: Normal)   Pulse 97   Ht '5\' 2"'$  (1.575 m)   Wt 144 lb 12.8 oz (65.7 kg)   BMI 26.48 kg/m?   ? ?Physical Exam ?Vitals and nursing note reviewed.  ?Constitutional:   ?   Appearance: She is well-developed.  ?HENT:  ?   Head: Normocephalic and atraumatic.  ?Eyes:  ?   Conjunctiva/sclera: Conjunctivae normal.  ?Cardiovascular:  ?   Rate and Rhythm: Normal rate and regular rhythm.  ?   Heart sounds: Normal heart sounds.  ?Pulmonary:  ?   Effort: Pulmonary effort is normal.  ?   Breath sounds: Normal breath sounds.  ?Abdominal:  ?   General: Bowel sounds are normal.  ?   Palpations: Abdomen is soft.  ?Musculoskeletal:  ?   Cervical  back: Normal range of motion.  ?Lymphadenopathy:  ?   Cervical: No cervical adenopathy.  ?Skin: ?   General: Skin is warm and dry.  ?   Capillary Refill: Capillary refill takes less than 2 seconds.  ?Neurological:  ?   Mental Status: She is alert and oriented to person, place, and time.  ?Psychiatric:     ?   Behavior: Behavior normal.  ?  ? ?Musculoskeletal Exam: C-spine was in good range of motion.  She had full range of motion of bilateral shoulder joints with discomfort.  She had good range of motion of her elbow joints with some discomfort.  She had limited range of motion of bilateral wrist joints with synovitis over bilateral wrist joints and MCP joints.  She had incomplete fist formation bilaterally.  Hip joints were difficult to assess in the sitting position.  She was unable to get up on the exam table.  She  had warmth and swelling in bilateral knee joints and ankle joints.  She had largest swelling in her left ankle joint.  She had edema on her bilateral lower extremities with swelling over MTPs and PIPs. ? ?CDAI Exam: ?CDAI Score: 28  ?Patient Global: 10 mm; Provider Global: 10 mm ?Swollen: 22 ; Tender: 24  ?Joint Exam 10/09/2021  ? ?   Right  Left  ?Glenohumeral   Tender   Tender  ?Wrist  Swollen Tender  Swollen Tender  ?MCP 2  Swollen Tender     ?MCP 3  Swollen Tender  Swollen Tender  ?MCP 4  Swollen Tender     ?MCP 5  Swollen Tender     ?PIP 2     Swollen Tender  ?PIP 3     Swollen Tender  ?PIP 4  Swollen Tender     ?Knee  Swollen Tender  Swollen Tender  ?Ankle  Swollen Tender  Swollen Tender  ?MTP 1  Swollen Tender  Swollen Tender  ?MTP 2     Swollen Tender  ?MTP 3  Swollen Tender  Swollen Tender  ?MTP 4  Swollen Tender  Swollen Tender  ?MTP 5  Swollen Tender     ? ? ? ?Investigation: ?No additional findings. ? ?Imaging: ?No results found. ? ?Recent Labs: ?Lab Results  ?Component Value Date  ? WBC 6.2 03/08/2021  ? HGB 12.4 03/08/2021  ? PLT 244.0 03/08/2021  ? NA 138 07/12/2021  ? K 4.2 07/12/2021   ? CL 103 07/12/2021  ? CO2 29 07/12/2021  ? GLUCOSE 92 07/12/2021  ? BUN 16 07/12/2021  ? CREATININE 0.52 07/12/2021  ? BILITOT 0.8 03/08/2021  ? ALKPHOS 66 03/08/2021  ? AST 22 03/08/2021  ? ALT 16 03/08/2021  ? PR

## 2021-10-09 ENCOUNTER — Encounter: Payer: Self-pay | Admitting: Rheumatology

## 2021-10-09 ENCOUNTER — Ambulatory Visit (INDEPENDENT_AMBULATORY_CARE_PROVIDER_SITE_OTHER): Payer: Medicare Other

## 2021-10-09 ENCOUNTER — Ambulatory Visit (INDEPENDENT_AMBULATORY_CARE_PROVIDER_SITE_OTHER): Payer: Medicare Other | Admitting: Rheumatology

## 2021-10-09 VITALS — BP 117/77 | HR 97 | Ht 62.0 in | Wt 144.8 lb

## 2021-10-09 DIAGNOSIS — G43019 Migraine without aura, intractable, without status migrainosus: Secondary | ICD-10-CM

## 2021-10-09 DIAGNOSIS — M79641 Pain in right hand: Secondary | ICD-10-CM

## 2021-10-09 DIAGNOSIS — M25562 Pain in left knee: Secondary | ICD-10-CM | POA: Diagnosis not present

## 2021-10-09 DIAGNOSIS — L301 Dyshidrosis [pompholyx]: Secondary | ICD-10-CM

## 2021-10-09 DIAGNOSIS — G8929 Other chronic pain: Secondary | ICD-10-CM

## 2021-10-09 DIAGNOSIS — M059 Rheumatoid arthritis with rheumatoid factor, unspecified: Secondary | ICD-10-CM

## 2021-10-09 DIAGNOSIS — M79642 Pain in left hand: Secondary | ICD-10-CM

## 2021-10-09 DIAGNOSIS — M25561 Pain in right knee: Secondary | ICD-10-CM

## 2021-10-09 DIAGNOSIS — Z8269 Family history of other diseases of the musculoskeletal system and connective tissue: Secondary | ICD-10-CM | POA: Diagnosis not present

## 2021-10-09 DIAGNOSIS — R5383 Other fatigue: Secondary | ICD-10-CM

## 2021-10-09 DIAGNOSIS — Z79899 Other long term (current) drug therapy: Secondary | ICD-10-CM

## 2021-10-09 DIAGNOSIS — M79672 Pain in left foot: Secondary | ICD-10-CM

## 2021-10-09 DIAGNOSIS — L501 Idiopathic urticaria: Secondary | ICD-10-CM

## 2021-10-09 DIAGNOSIS — R7612 Nonspecific reaction to cell mediated immunity measurement of gamma interferon antigen response without active tuberculosis: Secondary | ICD-10-CM

## 2021-10-09 DIAGNOSIS — M79671 Pain in right foot: Secondary | ICD-10-CM

## 2021-10-09 DIAGNOSIS — M19071 Primary osteoarthritis, right ankle and foot: Secondary | ICD-10-CM

## 2021-10-09 DIAGNOSIS — Z227 Latent tuberculosis: Secondary | ICD-10-CM

## 2021-10-09 MED ORDER — PREDNISONE 5 MG PO TABS: ORAL_TABLET | ORAL | 0 refills | Status: DC

## 2021-10-09 NOTE — Patient Instructions (Addendum)
**Please bring health department card at the next visit** ? ?Abatacept solution for injection (subcutaneous or intravenous use) ?What is this medication? ?ABATACEPT (a ba TA sept) is used to treat rheumatoid arthritis, psoriatic arthritis, and juvenile idiopathic arthritis. It prevents acute graft-versus-host disease following a stem-cell transplant. ?This medicine may be used for other purposes; ask your health care provider or pharmacist if you have questions. ?COMMON BRAND NAME(S): Minna Antis ClickJect ?What should I tell my care team before I take this medication? ?They need to know if you have any of these conditions: ?cancer ?diabetes ?hepatitis B or history of hepatitis B infection ?immune system problems ?infection or history of infection (especially a virus infection such as chickenpox, cold sores, or herpes) ?lung or breathing problems, like chronic obstructive pulmonary disease (COPD) ?recently received or scheduled to receive a vaccination ?scheduled to have surgery ?tuberculosis, a positive skin test for tuberculosis, or have recently been in close contact with someone who has tuberculosis ?an unusual or allergic reaction to abatacept, other medicines, foods, dyes, or preservatives ?pregnant or trying to get pregnant ?breast-feeding ?How should I use this medication? ?This medicine is for infusion into a vein or for injection under the skin. Infusions are given by a health care professional in a hospital or clinic setting. If you are to give your own medicine at home, you will be taught how to prepare and give this medicine under the skin. Use exactly as directed. Take your medicine at regular intervals. Do not take your medicine more often than directed. ?It is important that you put your used needles and syringes in a special sharps container. Do not put them in a trash can. If you do not have a sharps container, call your pharmacist or health care provider to get one. ?Talk to your pediatrician  regarding the use of this medicine in children. While infusions in a clinic may be prescribed for children as young as 2 years for selected conditions, precautions do apply. ?Overdosage: If you think you have taken too much of this medicine contact a poison control center or emergency room at once. ?NOTE: This medicine is only for you. Do not share this medicine with others. ?What if I miss a dose? ?This medicine is used once a week if given by injection under the skin. If you miss a dose, take it as soon as you can. If it is almost time for your next dose, take only that dose. Do not take double or extra doses. ?If you are to be given an infusion of this medicine, it is important not to miss your dose. Doses are usually every 4 weeks. Call your doctor or health care professional if you are unable to keep an appointment. ?What may interact with this medication? ?Do not take this medicine with any of the following medications: ?live vaccines ?This medicine may also interact with the following medications: ?anakinra ?baricitinib ?canakinumab ?medicines that lower your chance of fighting an infection ?rituximab ?TNF blockers such as adalimumab, certolizumab, etanercept, golimumab, infliximab ?tocilizumab ?tofacitinib ?upadacitinib ?ustekinumab ?This list may not describe all possible interactions. Give your health care provider a list of all the medicines, herbs, non-prescription drugs, or dietary supplements you use. Also tell them if you smoke, drink alcohol, or use illegal drugs. Some items may interact with your medicine. ?What should I watch for while using this medication? ?Visit your doctor for regular checks on your progress. Tell your doctor or health care professional if your symptoms do not start to get  better or if they get worse. ?You will be tested for tuberculosis (TB) before you start this medicine. If your doctor prescribed any medicine for TB, you should start taking the TB medicine before starting  this medicine. Make sure to finish the full course of TB medicine. ?This medicine may increase your risk of getting an infection. Call your doctor or health care professional if you get fever, chills, or sore throat, or other symptoms of a cold or flu. Do not treat yourself. Try to avoid being around people who are sick. ?If you have diabetes and are getting this medicine in a vein, the infusion can give false high blood sugar readings on the day of your dose. This may happen if you use certain types of blood glucose tests. Your health care provider may tell you to use a different way to monitor your blood sugar levels. ?What side effects may I notice from receiving this medication? ?Side effects that you should report to your doctor or health care professional as soon as possible: ?allergic reactions like skin rash, itching or hives, swelling of the face, lips, or tongue ?breathing problems ?chest pain ?dizziness ?signs and symptoms of infection like fever; chills; cough; sore throat; pain or trouble passing urine ?unusually weak or tired ?Side effects that usually do not require medical attention (report to your doctor or health care professional if they continue or are bothersome): ?diarrhea ?headache ?nausea ?pain, redness, or irritation at site where injected ?stomach pain or upset ?This list may not describe all possible side effects. Call your doctor for medical advice about side effects. You may report side effects to FDA at 1-800-FDA-1088. ?Where should I keep my medication? ?Infusions will be given in a hospital or clinic and will not be stored at home. ?Storage for syringes and autoinjectors stored at home: ?Keep out of the reach of children. Store in a refrigerator between 2 and 8 degrees C (36 and 46 degrees F). Keep this medicine in the original container. Protect from light. Do not freeze. Do not shake. Throw away any unused medicine after the expiration date. ?NOTE: This sheet is a summary. It may  not cover all possible information. If you have questions about this medicine, talk to your doctor, pharmacist, or health care provider. ?? 2023 Elsevier/Gold Standard (2020-07-20 00:00:00) ?Leflunomide tablets ?What is this medication? ?LEFLUNOMIDE (le FLOO na mide) is for rheumatoid arthritis. ?This medicine may be used for other purposes; ask your health care provider or pharmacist if you have questions. ?COMMON BRAND NAME(S): Arava ?What should I tell my care team before I take this medication? ?They need to know if you have any of these conditions: ?diabetes ?have a fever or infection ?high blood pressure ?immune system problems ?kidney disease ?liver disease ?low blood cell counts, like low white cell, platelet, or red cell counts ?lung or breathing disease, like asthma ?recently received or scheduled to receive a vaccine ?receiving treatment for cancer ?skin conditions or sensitivity ?tingling of the fingers or toes, or other nerve disorder ?tuberculosis ?an unusual or allergic reaction to leflunomide, teriflunomide, other medicines, food, dyes, or preservatives ?pregnant or trying to get pregnant ?breast-feeding ?How should I use this medication? ?Take this medicine by mouth with a full glass of water. Follow the directions on the prescription label. Take your medicine at regular intervals. Do not take your medicine more often than directed. Do not stop taking except on your doctor's advice. ?Talk to your pediatrician regarding the use of this medicine in children.  Special care may be needed. ?Overdosage: If you think you have taken too much of this medicine contact a poison control center or emergency room at once. ?NOTE: This medicine is only for you. Do not share this medicine with others. ?What if I miss a dose? ?If you miss a dose, take it as soon as you can. If it is almost time for your next dose, take only that dose. Do not take double or extra doses. ?What may interact with this medication? ?Do not  take this medicine with any of the following medications: ?teriflunomide ?This medicine may also interact with the following medications: ?alosetron ?birth control pills ?caffeine ?cefaclor ?certain medi

## 2021-10-10 NOTE — Progress Notes (Signed)
UA is consistent with urinary tract infection.  Please have patient contact her PCP for treatment.

## 2021-10-11 ENCOUNTER — Encounter: Payer: Self-pay | Admitting: *Deleted

## 2021-10-12 ENCOUNTER — Ambulatory Visit: Payer: No Typology Code available for payment source | Admitting: Family Medicine

## 2021-10-12 ENCOUNTER — Ambulatory Visit (INDEPENDENT_AMBULATORY_CARE_PROVIDER_SITE_OTHER): Payer: Medicare Other

## 2021-10-12 ENCOUNTER — Ambulatory Visit (INDEPENDENT_AMBULATORY_CARE_PROVIDER_SITE_OTHER): Payer: Medicare Other | Admitting: Family Medicine

## 2021-10-12 ENCOUNTER — Encounter: Payer: Self-pay | Admitting: Family Medicine

## 2021-10-12 VITALS — BP 122/80 | HR 85 | Temp 97.8°F | Ht 62.0 in | Wt 142.0 lb

## 2021-10-12 DIAGNOSIS — R7612 Nonspecific reaction to cell mediated immunity measurement of gamma interferon antigen response without active tuberculosis: Secondary | ICD-10-CM

## 2021-10-12 DIAGNOSIS — Z79899 Other long term (current) drug therapy: Secondary | ICD-10-CM

## 2021-10-12 DIAGNOSIS — R82998 Other abnormal findings in urine: Secondary | ICD-10-CM | POA: Diagnosis not present

## 2021-10-12 DIAGNOSIS — Z9225 Personal history of immunosupression therapy: Secondary | ICD-10-CM | POA: Diagnosis not present

## 2021-10-12 LAB — POCT URINALYSIS DIPSTICK
Bilirubin, UA: NEGATIVE
Blood, UA: NEGATIVE
Glucose, UA: NEGATIVE
Ketones, UA: NEGATIVE
Leukocytes, UA: NEGATIVE
Nitrite, UA: NEGATIVE
Protein, UA: POSITIVE — AB
Spec Grav, UA: 1.02 (ref 1.010–1.025)
Urobilinogen, UA: 2 E.U./dL — AB
pH, UA: 7 (ref 5.0–8.0)

## 2021-10-12 NOTE — Progress Notes (Signed)
All the labs are available now.  We will discuss results at the follow-up visit.  It is okay to send the prescription for leflunomide 20 mg p.o. daily.  She should have labs 2 weeks after starting leflunomide.

## 2021-10-12 NOTE — Progress Notes (Signed)
Subjective: ? Melanie Garrett is a 66 y.o. female who is here to be treated for a urinary tract infection. Urinalysis performed at her rheumatologist appointment on 10/09/2021 and was positive for UTI.  ? She denies having any urinary symptoms.  Feels at her baseline except for foot pain.  ? ?Denies fever, chills, dizziness, chest pain, palpitations, shortness of breath, abdominal pain, N/V/D.  ? ? ?Past Medical History:  ?Diagnosis Date  ? Osteoarthritis 02/09/2020  ? RA (rheumatoid arthritis) ([place]) 04/05/2021  ? ? ?ROS as in subjective ? ?Reviewed allergies, medications, past medical, surgical, and social history.  ? ? ?Objective: ?Vitals:  ? 10/12/21 1132  ?BP: 122/80  ?Pulse: 85  ?Temp: 97.8 ?F (36.6 ?C)  ?SpO2: 99%  ? ? ?General appearance: alert, no distress, WD/WN, female ?Abdomen: non distended ?Back: no CVA tenderness ?GU: not done  ?   ? ?Laboratory:  ?Urine dipstick: negative  ?Reviewed UA done at rheumatologist on 10/09/2021 which showed +nit, +leuks, -RBC ?  ? ? ?Assessment: ?Leukocytes in urine - Plan: POCT urinalysis dipstick, Urine Culture ? ? ?Plan: ?Discussed that her urine today is negative for infection. No treatment warranted.  ?  ?Urine culture sent.  ?  ? ?

## 2021-10-12 NOTE — Progress Notes (Signed)
Chest x ray is within normal limits.

## 2021-10-13 LAB — URINE CULTURE: Result:: NO GROWTH

## 2021-10-15 ENCOUNTER — Other Ambulatory Visit: Payer: Self-pay | Admitting: *Deleted

## 2021-10-15 DIAGNOSIS — Z79899 Other long term (current) drug therapy: Secondary | ICD-10-CM

## 2021-10-15 MED ORDER — LEFLUNOMIDE 20 MG PO TABS: 20.0000 mg | ORAL_TABLET | Freq: Every day | ORAL | 0 refills | Status: DC

## 2021-10-15 NOTE — Telephone Encounter (Signed)
-----   Message from Bo Merino, MD sent at 10/12/2021  2:50 PM EDT ----- ?All the labs are available now.  We will discuss results at the follow-up visit.  It is okay to send the prescription for leflunomide 20 mg p.o. daily.  She should have labs 2 weeks after starting leflunomide. ?

## 2021-10-16 LAB — QUANTIFERON-TB GOLD PLUS
Mitogen-NIL: 2.39 IU/mL
NIL: 0.02 IU/mL
QuantiFERON-TB Gold Plus: NEGATIVE
TB1-NIL: 0.01 IU/mL
TB2-NIL: 0 IU/mL

## 2021-10-16 LAB — COMPLETE METABOLIC PANEL WITH GFR
AG Ratio: 0.9 (calc) — ABNORMAL LOW (ref 1.0–2.5)
ALT: 6 U/L (ref 6–29)
AST: 14 U/L (ref 10–35)
Albumin: 3.2 g/dL — ABNORMAL LOW (ref 3.6–5.1)
Alkaline phosphatase (APISO): 52 U/L (ref 37–153)
BUN/Creatinine Ratio: 22 (calc) (ref 6–22)
BUN: 10 mg/dL (ref 7–25)
CO2: 25 mmol/L (ref 20–32)
Calcium: 9.7 mg/dL (ref 8.6–10.4)
Chloride: 99 mmol/L (ref 98–110)
Creat: 0.45 mg/dL — ABNORMAL LOW (ref 0.50–1.05)
Globulin: 3.7 g/dL (calc) (ref 1.9–3.7)
Glucose, Bld: 83 mg/dL (ref 65–99)
Potassium: 4.1 mmol/L (ref 3.5–5.3)
Sodium: 137 mmol/L (ref 135–146)
Total Bilirubin: 0.5 mg/dL (ref 0.2–1.2)
Total Protein: 6.9 g/dL (ref 6.1–8.1)
eGFR: 107 mL/min/{1.73_m2} (ref >=60)

## 2021-10-16 LAB — CBC WITH DIFFERENTIAL/PLATELET
Absolute Monocytes: 840 cells/uL (ref 200–950)
Basophils Absolute: 80 cells/uL (ref 0–200)
Basophils Relative: 1.1 %
Eosinophils Absolute: 146 cells/uL (ref 15–500)
Eosinophils Relative: 2 %
HCT: 32.1 % — ABNORMAL LOW (ref 35.0–45.0)
Hemoglobin: 10.2 g/dL — ABNORMAL LOW (ref 11.7–15.5)
Lymphs Abs: 1475 cells/uL (ref 850–3900)
MCH: 26.4 pg — ABNORMAL LOW (ref 27.0–33.0)
MCHC: 31.8 g/dL — ABNORMAL LOW (ref 32.0–36.0)
MCV: 82.9 fL (ref 80.0–100.0)
MPV: 9.8 fL (ref 7.5–12.5)
Monocytes Relative: 11.5 %
Neutro Abs: 4760 cells/uL (ref 1500–7800)
Neutrophils Relative %: 65.2 %
Platelets: 432 10*3/uL — ABNORMAL HIGH (ref 140–400)
RBC: 3.87 10*6/uL (ref 3.80–5.10)
RDW: 14 % (ref 11.0–15.0)
Total Lymphocyte: 20.2 %
WBC: 7.3 10*3/uL (ref 3.8–10.8)

## 2021-10-16 LAB — URINALYSIS, ROUTINE W REFLEX MICROSCOPIC
Bilirubin Urine: NEGATIVE
Glucose, UA: NEGATIVE
Hgb urine dipstick: NEGATIVE
Nitrite: POSITIVE — AB
Specific Gravity, Urine: 1.023 (ref 1.001–1.035)
WBC, UA: >=60 /HPF — AB (ref 0–5)
pH: 6.5 (ref 5.0–8.0)

## 2021-10-16 LAB — TSH: TSH: 2.11 mIU/L (ref 0.40–4.50)

## 2021-10-16 LAB — PROTEIN ELECTROPHORESIS, SERUM, WITH REFLEX
Albumin ELP: 2.8 g/dL — ABNORMAL LOW (ref 3.8–4.8)
Alpha 1: 0.5 g/dL — ABNORMAL HIGH (ref 0.2–0.3)
Alpha 2: 1.1 g/dL — ABNORMAL HIGH (ref 0.5–0.9)
Beta 2: 0.5 g/dL (ref 0.2–0.5)
Beta Globulin: 0.3 g/dL — ABNORMAL LOW (ref 0.4–0.6)
Gamma Globulin: 1.6 g/dL (ref 0.8–1.7)
Total Protein: 7 g/dL (ref 6.1–8.1)

## 2021-10-16 LAB — HEPATITIS B CORE ANTIBODY, IGM: Hep B C IgM: NONREACTIVE

## 2021-10-16 LAB — MICROSCOPIC MESSAGE

## 2021-10-16 LAB — CYCLIC CITRUL PEPTIDE ANTIBODY, IGG: Cyclic Citrullin Peptide Ab: >250 UNITS — ABNORMAL HIGH

## 2021-10-16 LAB — HEPATITIS C ANTIBODY
Hepatitis C Ab: NONREACTIVE
SIGNAL TO CUT-OFF: 0.22 (ref <1.00)

## 2021-10-16 LAB — ANTI-SMITH ANTIBODY: ENA SM Ab Ser-aCnc: <1 AI

## 2021-10-16 LAB — SEDIMENTATION RATE: Sed Rate: >130 mm/h — ABNORMAL HIGH (ref 0–30)

## 2021-10-16 LAB — ANTI-DNA ANTIBODY, DOUBLE-STRANDED: ds DNA Ab: <1 IU/mL

## 2021-10-16 LAB — CK: Total CK: 45 U/L (ref 29–143)

## 2021-10-16 LAB — ANA: Anti Nuclear Antibody (ANA): POSITIVE — AB

## 2021-10-16 LAB — RHEUMATOID FACTOR: Rheumatoid fact SerPl-aCnc: >1000 IU/mL — ABNORMAL HIGH (ref <14)

## 2021-10-16 LAB — RNP ANTIBODY: Ribonucleic Protein(ENA) Antibody, IgG: <1 AI

## 2021-10-16 LAB — HIV ANTIBODY (ROUTINE TESTING W REFLEX): HIV 1&2 Ab, 4th Generation: NONREACTIVE

## 2021-10-16 LAB — C3 AND C4
C3 Complement: 140 mg/dL (ref 83–193)
C4 Complement: 23 mg/dL (ref 15–57)

## 2021-10-16 LAB — SJOGRENS SYNDROME-A EXTRACTABLE NUCLEAR ANTIBODY: SSA (Ro) (ENA) Antibody, IgG: <1 AI

## 2021-10-16 LAB — IGG, IGA, IGM
IgG (Immunoglobin G), Serum: 1594 mg/dL — ABNORMAL HIGH (ref 600–1540)
IgM, Serum: 416 mg/dL — ABNORMAL HIGH (ref 50–300)
Immunoglobulin A: 531 mg/dL — ABNORMAL HIGH (ref 70–320)

## 2021-10-16 LAB — IFE INTERPRETATION

## 2021-10-16 LAB — SJOGRENS SYNDROME-B EXTRACTABLE NUCLEAR ANTIBODY: SSB (La) (ENA) Antibody, IgG: <1 AI

## 2021-10-16 LAB — HEPATITIS B SURFACE ANTIGEN: Hepatitis B Surface Ag: NONREACTIVE

## 2021-10-16 LAB — ANTI-NUCLEAR AB-TITER (ANA TITER): ANA Titer 1: 1:40 {titer} — ABNORMAL HIGH

## 2021-10-16 NOTE — Progress Notes (Signed)
? ?Office Visit Note ? ?Patient: Melanie Garrett             ?Date of Birth: 04-Oct-1955           ?MRN: [id number]             ?PCP: Janith Lima, MD ?Referring: Janith Lima, MD ?Visit Date: 10/30/2021 ?Occupation: '@GUAROCC'$ @ ? ?Subjective:  ?Medication management ? ?History of Present Illness: Melanie Garrett is a 66 y.o. female with seropositive rheumatoid arthritis.  She returns today after her initial visit on October 09, 2021.  At that time she had synovitis in multiple joints.  Her labs showed positive TB Gold and she was referred to health department where she was a started on rifampin.  She is a previous patient of [facility] rheumatology.  Patient was initially treated with methotrexate and then leflunomide due to inadequate response to methotrexate.  Where she was started on subcutaneous Orencia at [facility] rheumatology and then the treatment was interrupted due to being on rifampin for latent tuberculosis.  A prednisone taper was given at the last visit.  She had been taking Orencia subcutaneously weekly.  She is on prednisone taper which she believes finishing this week.  She has been taking leflunomide since October 15, 2021.  She has noticed improvement in joint pain and joint swelling.  She still have some swelling in her both ankles.  She continues to have discomfort in her feet.  She has difficulty walking.  She mobilizes with the help of her stick. ? ?Activities of Daily Living:  ?Patient reports morning stiffness for 1 hour.   ?Patient Reports nocturnal pain.  ?Difficulty dressing/grooming: Reports ?Difficulty climbing stairs: Reports ?Difficulty getting out of chair: Reports ?Difficulty using hands for taps, buttons, cutlery, and/or writing: Reports ? ?Review of Systems  ?Constitutional:  Positive for fatigue.  ?HENT:  Negative for mouth sores, mouth dryness and nose dryness.   ?Eyes:  Negative for pain, itching and dryness.  ?Respiratory:  Negative for shortness of breath and difficulty  breathing.   ?Cardiovascular:  Negative for chest pain and palpitations.  ?Gastrointestinal:  Negative for blood in stool, constipation and diarrhea.  ?Endocrine: Negative for increased urination.  ?Genitourinary:  Negative for difficulty urinating.  ?Musculoskeletal:  Positive for joint pain, joint pain, joint swelling and morning stiffness. Negative for myalgias, muscle tenderness and myalgias.  ?Skin:  Negative for color change, rash and redness.  ?Allergic/Immunologic: Negative for susceptible to infections.  ?Neurological:  Negative for dizziness, numbness, headaches and memory loss.  ?Hematological:  Negative for bruising/bleeding tendency.  ?Psychiatric/Behavioral:  Negative for confusion.   ? ?PMFS History:  ?Patient Active Problem List  ? Diagnosis Date Noted  ? Arthritis of first metatarsophalangeal (MTP) joint of right foot 07/06/2021  ? RA (rheumatoid arthritis) ([place]) 04/05/2021  ? TB lung, latent 04/05/2021  ? Intractable migraine without aura and without status migrainosus 08/21/2020  ? Screening for cervical cancer 08/21/2020  ? Screen for colon cancer 08/16/2020  ? Visit for screening mammogram 08/16/2020  ? Hypokalemia due to inadequate potassium intake 08/16/2020  ? Osteoarthritis 02/09/2020  ? Obesity (BMI 30.0-34.9) 06/29/2018  ? 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 Routine general medical examination at a health care facility 09/02/2016  ? Eczema, dyshidrotic 02/02/2015  ?  ?Past Medical History:  ?Diagnosis Date  ? Osteoarthritis 02/09/2020  ? RA (rheumatoid arthritis) ([place]) 04/05/2021  ?  ?Family History  ?Problem Relation Age of Onset  ? Arthritis Mother   ? Lupus Sister   ?  Lupus Brother   ? Seizures Brother   ? Hypertension Daughter   ? Arthritis Daughter   ? Hypertension Daughter   ? Arthritis Daughter   ? Alcohol abuse Neg Hx   ? Cancer Neg Hx   ? Diabetes Neg Hx   ? Drug abuse Neg Hx   ? Early death Neg Hx   ? Heart disease Neg Hx   ? Stroke Neg Hx   ? ?Past Surgical History:  ?Procedure  Laterality Date  ? KNEE SURGERY Left   ? acl repair  ? ?Social History  ? ?Social History Narrative  ? Not on file  ? ?Immunization History  ?Administered Date(s) Administered  ? Influenza,inj,Quad PF,6+ Mos 06/29/2018, 03/08/2021  ? Influenza-Unspecified 02/23/2015, 03/24/2016, 03/24/2017  ? PNEUMOCOCCAL CONJUGATE-20 07/12/2021  ? Td 04/27/2010  ? Tdap 05/04/2013  ?  ? ?Objective: ?Vital Signs: BP (!) 166/90 (BP Location: Left Arm, Patient Position: Sitting, Cuff Size: Normal)   Pulse 60   Ht '5\' 2"'$  (1.575 m)   Wt 147 lb 12.8 oz (67 kg)   BMI 27.03 kg/m?   ? ?Physical Exam ?Vitals and nursing note reviewed.  ?Constitutional:   ?   Appearance: She is well-developed.  ?HENT:  ?   Head: Normocephalic and atraumatic.  ?Eyes:  ?   Conjunctiva/sclera: Conjunctivae normal.  ?Cardiovascular:  ?   Rate and Rhythm: Normal rate and regular rhythm.  ?   Heart sounds: Normal heart sounds.  ?Pulmonary:  ?   Effort: Pulmonary effort is normal.  ?   Breath sounds: Normal breath sounds.  ?Abdominal:  ?   General: Bowel sounds are normal.  ?   Palpations: Abdomen is soft.  ?Musculoskeletal:  ?   Cervical back: Normal range of motion.  ?Lymphadenopathy:  ?   Cervical: No cervical adenopathy.  ?Skin: ?   General: Skin is warm and dry.  ?   Capillary Refill: Capillary refill takes less than 2 seconds.  ?Neurological:  ?   Mental Status: She is alert and oriented to person, place, and time.  ?Psychiatric:     ?   Behavior: Behavior normal.  ?  ? ?Musculoskeletal Exam: C-spine was in good range of motion.  She had good range of motion of bilateral shoulders without discomfort.  Elbow joints in good range of motion.  She has synovitis of her left wrist joint and over bilateral MCPs as described below.  She has swelling in her left knee joint.  She is swelling in her bilateral ankles.  She had tenderness across MTPs. ? ?CDAI Exam: ?CDAI Score: 11.4  ?Patient Global: 7 mm; Provider Global: 7 mm ?Swollen: 7 ; Tender: 7  ?Joint Exam  10/30/2021  ? ?   Right  Left  ?Wrist     Swollen Tender  ?MCP 3  Swollen Tender  Swollen Tender  ?MCP 4  Swollen Tender     ?Knee     Swollen Tender  ?Ankle  Swollen Tender  Swollen Tender  ? ? ? ?Investigation: ?No additional findings. ? ?Imaging: ?DG Chest 2 View ? ?Result Date: 10/12/2021 ?CLINICAL DATA:  Immunosuppressive therapy EXAM: CHEST - 2 VIEW COMPARISON:  05/26/2020 FINDINGS: The heart size and mediastinal contours are within normal limits. Both lungs are clear. The visualized skeletal structures are unremarkable. IMPRESSION: No active cardiopulmonary disease. Electronically Signed   By: Kathreen Devoid M.D.   On: 10/12/2021 13:50  ? ?XR Foot 2 Views Left ? ?Result Date: 10/09/2021 ?First MTP, PIP and DIP narrowing  was noted.  Juxta-articular osteopenia was noted.  Intertarsal, tibiotalar and subtalar joint space narrowing was noted.  Inferior posterior calcaneal spurs were noted. Impression: These findings are consistent with rheumatoid arthritis and osteoarthritis overlap. ? ?XR Foot 2 Views Right ? ?Result Date: 10/09/2021 ?Juxta-articular osteopenia was noted.  PIP and DIP narrowing was noted.  Intertarsal narrowing was noted.  Tibiotalar and subtalar narrowing was noted.  Inferior posterior calcaneal spurs were noted. Impression: These findings are consistent with rheumatoid arthritis and osteoarthritis overlap. ? ?XR Hand 2 View Left ? ?Result Date: 10/09/2021 ?Juxta-articular osteopenia was noted.  Narrowing of all MCPs, PIPs and DIPs was noted.  CMC, intercarpal, radiocarpal joint space narrowing was noted.  Cystic versus erosive changes were noted in the carpal bones.  Possible erosion was noted in the left third MCP joint. Impression: These findings are consistent with rheumatoid arthritis and osteoarthritis overlap. ? ?XR Hand 2 View Right ? ?Result Date: 10/09/2021 ?Juxta-articular osteopenia was noted.  Narrowing of all MCP joints, PIP and DIP joints was noted.  Intercarpal, radiocarpal joint space  narrowing was noted.  Cystic changes were noted in the carpal bones.  Possible callus was noted in the right third metacarpal shaft. Impression: These findings are consistent with rheumatoid arthritis and osteoarthri

## 2021-10-30 ENCOUNTER — Encounter: Payer: Self-pay | Admitting: Rheumatology

## 2021-10-30 ENCOUNTER — Telehealth: Payer: Self-pay | Admitting: Pharmacist

## 2021-10-30 ENCOUNTER — Ambulatory Visit: Payer: Medicare Other | Admitting: Rheumatology

## 2021-10-30 VITALS — BP 166/90 | HR 60 | Ht 62.0 in | Wt 147.8 lb

## 2021-10-30 DIAGNOSIS — M79642 Pain in left hand: Secondary | ICD-10-CM | POA: Diagnosis not present

## 2021-10-30 DIAGNOSIS — L501 Idiopathic urticaria: Secondary | ICD-10-CM

## 2021-10-30 DIAGNOSIS — Z79899 Other long term (current) drug therapy: Secondary | ICD-10-CM

## 2021-10-30 DIAGNOSIS — G43019 Migraine without aura, intractable, without status migrainosus: Secondary | ICD-10-CM

## 2021-10-30 DIAGNOSIS — L301 Dyshidrosis [pompholyx]: Secondary | ICD-10-CM | POA: Diagnosis not present

## 2021-10-30 DIAGNOSIS — M25562 Pain in left knee: Secondary | ICD-10-CM | POA: Diagnosis not present

## 2021-10-30 DIAGNOSIS — M059 Rheumatoid arthritis with rheumatoid factor, unspecified: Secondary | ICD-10-CM

## 2021-10-30 DIAGNOSIS — G8929 Other chronic pain: Secondary | ICD-10-CM

## 2021-10-30 DIAGNOSIS — M79671 Pain in right foot: Secondary | ICD-10-CM | POA: Diagnosis not present

## 2021-10-30 DIAGNOSIS — M79641 Pain in right hand: Secondary | ICD-10-CM

## 2021-10-30 DIAGNOSIS — R7612 Nonspecific reaction to cell mediated immunity measurement of gamma interferon antigen response without active tuberculosis: Secondary | ICD-10-CM

## 2021-10-30 DIAGNOSIS — M25561 Pain in right knee: Secondary | ICD-10-CM

## 2021-10-30 DIAGNOSIS — Z8269 Family history of other diseases of the musculoskeletal system and connective tissue: Secondary | ICD-10-CM

## 2021-10-30 DIAGNOSIS — M79672 Pain in left foot: Secondary | ICD-10-CM

## 2021-10-30 MED ORDER — PREDNISONE 5 MG PO TABS: ORAL_TABLET | ORAL | 0 refills | Status: DC

## 2021-10-30 NOTE — Telephone Encounter (Signed)
Patient transitioning to Dr. Estanislado Pandy from [facility] rheumatology for RA management. Currently takes Orencia + leflunomide combination  ? ?She does not know who she fills through. Currently has four Orencia pens at home. ? ?Please start Orencia SQ BIV (continuation of therapy) ? ?Dose: '125mg'$  SQ every 7 days ? ?Dx: M05.9 (seropositive RA) ? ?Previously tried therapies: ?MTX - inadequate response ? ?Current regimen: leflunomide + Orencia SQ ? ? ?Knox Saliva, PharmD, MPH, BCPS, CPP ?Clinical Pharmacist (Rheumatology and Pulmonology) ?

## 2021-10-30 NOTE — Patient Instructions (Signed)
Standing Labs We placed an order today for your standing lab work.   Please have your standing labs drawn in August  and every 3 months  If possible, please have your labs drawn 2 weeks prior to your appointment so that the provider can discuss your results at your appointment.  Please note that you may see your imaging and lab results in MyChart before we have reviewed them. We may be awaiting multiple results to interpret others before contacting you. Please allow our office up to 72 hours to thoroughly review all of the results before contacting the office for clarification of your results.  We have open lab daily: Monday through Thursday from 1:30-4:30 PM and Friday from 1:30-4:00 PM at the office of Dr. Raisha Brabender, [facility].   Please be advised, all patients with office appointments requiring lab work will take precedent over walk-in lab work.  If possible, please come for your lab work on Monday and Friday afternoons, as you may experience shorter wait times. The office is located at [address], [address], [place], [place] [address] No appointment is necessary.   Labs are drawn by [facility]. Please bring your co-pay at the time of your lab draw.  You may receive a bill from [facility] for your lab work.  Please note if you are on Hydroxychloroquine and and an order has been placed for a Hydroxychloroquine level, you will need to have it drawn 4 hours or more after your last dose.  If you wish to have your labs drawn at another location, please call the office 24 hours in advance to send orders.  If you have any questions regarding directions or hours of operation,  please call [phone].   As a reminder, please drink plenty of water prior to coming for your lab work. Thanks!   Vaccines You are taking a medication(s) that can suppress your immune system.  The following immunizations are recommended: Flu annually Covid-19  Td/Tdap (tetanus, diphtheria,  pertussis) every 10 years Pneumonia (Prevnar 15 then Pneumovax 23 at least 1 year apart.  Alternatively, can take Prevnar 20 without needing additional dose) Shingrix: 2 doses from 4 weeks to 6 months apart  Please check with your PCP to make sure you are up to date.   If you have signs or symptoms of an infection or start antibiotics: First, call your PCP for workup of your infection. Hold your medication through the infection, until you complete your antibiotics, and until symptoms resolve if you take the following: Injectable medication (Actemra, Benlysta, Cimzia, Cosentyx, Enbrel, Humira, Kevzara, Orencia, Remicade, Simponi, Stelara, Taltz, Tremfya) Methotrexate Leflunomide (Arava) Mycophenolate (Cellcept) Xeljanz, Olumiant, or Rinvoq  

## 2021-10-31 LAB — CBC WITH DIFFERENTIAL/PLATELET
Absolute Monocytes: 591 cells/uL (ref 200–950)
Basophils Absolute: 58 cells/uL (ref 0–200)
Basophils Relative: 0.8 %
Eosinophils Absolute: 117 cells/uL (ref 15–500)
Eosinophils Relative: 1.6 %
HCT: 34.4 % — ABNORMAL LOW (ref 35.0–45.0)
Hemoglobin: 10.6 g/dL — ABNORMAL LOW (ref 11.7–15.5)
Lymphs Abs: 1577 cells/uL (ref 850–3900)
MCH: 26.6 pg — ABNORMAL LOW (ref 27.0–33.0)
MCHC: 30.8 g/dL — ABNORMAL LOW (ref 32.0–36.0)
MCV: 86.4 fL (ref 80.0–100.0)
MPV: 10.7 fL (ref 7.5–12.5)
Monocytes Relative: 8.1 %
Neutro Abs: 4957 cells/uL (ref 1500–7800)
Neutrophils Relative %: 67.9 %
Platelets: 235 10*3/uL (ref 140–400)
RBC: 3.98 10*6/uL (ref 3.80–5.10)
RDW: 14.9 % (ref 11.0–15.0)
Total Lymphocyte: 21.6 %
WBC: 7.3 10*3/uL (ref 3.8–10.8)

## 2021-10-31 LAB — COMPLETE METABOLIC PANEL WITH GFR
AG Ratio: 1.1 (calc) (ref 1.0–2.5)
ALT: 4 U/L — ABNORMAL LOW (ref 6–29)
AST: 9 U/L — ABNORMAL LOW (ref 10–35)
Albumin: 3.4 g/dL — ABNORMAL LOW (ref 3.6–5.1)
Alkaline phosphatase (APISO): 50 U/L (ref 37–153)
BUN: 12 mg/dL (ref 7–25)
CO2: 27 mmol/L (ref 20–32)
Calcium: 9.3 mg/dL (ref 8.6–10.4)
Chloride: 103 mmol/L (ref 98–110)
Creat: 0.55 mg/dL (ref 0.50–1.05)
Globulin: 3.2 g/dL (calc) (ref 1.9–3.7)
Glucose, Bld: 82 mg/dL (ref 65–99)
Potassium: 3.7 mmol/L (ref 3.5–5.3)
Sodium: 139 mmol/L (ref 135–146)
Total Bilirubin: 0.5 mg/dL (ref 0.2–1.2)
Total Protein: 6.6 g/dL (ref 6.1–8.1)
eGFR: 102 mL/min/{1.73_m2} (ref >=60)

## 2021-10-31 LAB — SEDIMENTATION RATE: Sed Rate: 41 mm/h — ABNORMAL HIGH (ref 0–30)

## 2021-10-31 NOTE — Progress Notes (Signed)
Hemoglobin is low and stable.  CMP is normal.  Sedimentation rate is improved at 41.

## 2021-11-02 NOTE — Telephone Encounter (Signed)
Submitted a Prior Authorization request to  [place] Medicaid  via electronic fax for [facility] via CoverMyMeds. Will update once we receive a response. ? ?Key: [id number] ? ?Knox Saliva, PharmD, MPH, BCPS, CPP ?Clinical Pharmacist (Rheumatology and Pulmonology) ?

## 2021-11-05 ENCOUNTER — Other Ambulatory Visit (HOSPITAL_COMMUNITY): Payer: Self-pay

## 2021-11-05 NOTE — Telephone Encounter (Signed)
Received notification from  [place] [employer]  regarding a prior authorization for [facility]. Authorization has been APPROVED from 11/03/21 to 11/04/22.  ? ?Per test claim, patient has no copay. ? ?Patient can fill through [place]: [phone]  ? ?Phone # [phone] ? 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 Will submit new auth ? ?Submitted a Prior Authorization request to [facility] for [facility] via CoverMyMeds. Will update once we receive a response. ? ?Key: [id number] ? ?Knox Saliva, PharmD, MPH, BCPS, CPP ?Clinical Pharmacist (Rheumatology and Pulmonology) ? ?

## 2021-11-08 ENCOUNTER — Other Ambulatory Visit (HOSPITAL_COMMUNITY): Payer: Self-pay

## 2021-11-08 NOTE — Telephone Encounter (Signed)
Received notification from [facility] regarding a prior authorization for [facility]. Authorization has been APPROVED from 11/05/21 to 05/08/22.   Patient has no copay for 28 day supply (max supply that can be filled)  Patient can fill through [place]: [phone]   Authorization # [id number] Phone # [phone]  ATC patient to review. Left VM for patient and her daughter Alwyn Ren. Will send refill to [facility] after address is confirmed for shipment  Knox Saliva, PharmD, MPH, BCPS, CPP Clinical Pharmacist (Rheumatology and Pulmonology)

## 2021-11-11 ENCOUNTER — Other Ambulatory Visit: Payer: Self-pay | Admitting: Physician Assistant

## 2021-11-14 ENCOUNTER — Other Ambulatory Visit: Payer: Self-pay | Admitting: Rheumatology

## 2021-11-14 MED ORDER — LEFLUNOMIDE 20 MG PO TABS: 20.0000 mg | ORAL_TABLET | Freq: Every day | ORAL | 2 refills | Status: DC

## 2021-11-14 MED ORDER — PREDNISONE 5 MG PO TABS: ORAL_TABLET | ORAL | 0 refills | Status: DC

## 2021-11-14 NOTE — Addendum Note (Signed)
Addended by: Carole Binning on: 11/14/2021 04:16 PM   Modules accepted: Orders

## 2021-11-14 NOTE — Telephone Encounter (Signed)
Attempted to contact the patient and left message advising patient we have sent her prescriptions to the pharmacy.

## 2021-11-14 NOTE — Telephone Encounter (Signed)
Patients daughter called the office requesting a refill of Arava '20mg'$  and Prednisone '5mg'$  be sent to [facility] on [address]. She states the pharmacy told her the Jolee Ewing was declined and they would like to know why.

## 2021-11-14 NOTE — Telephone Encounter (Signed)
Next Visit: 01/02/2022  Last Visit: 10/30/2021  Last Fill: 10/15/2021  DX: Seropositive rheumatoid arthritis   Current Dose per office note 10/30/2021: not discussed   Labs: 10/30/2021 Hemoglobin is low and stable.  CMP is normal.    Okay to refill Arava? Patient's daughter requesting refill on Prednisone taper as well, okay to refill?

## 2021-11-14 NOTE — Telephone Encounter (Signed)
Okay to send prednisone starting at 10 mg p.o. daily for 4 days and taper by 2.5 mg every 4 days.

## 2021-11-16 ENCOUNTER — Other Ambulatory Visit: Payer: Self-pay | Admitting: Rheumatology

## 2021-11-16 NOTE — Telephone Encounter (Signed)
Next Visit: 01/02/2022  Last Visit: 10/30/2021  Last Fill: 07/06/2021  YN:[id number] rheumatoid arthritis   Current Dose per office note 10/30/2021: not discussed  Labs: 10/30/2021 Hemoglobin is low and stable.  CMP is normal.   TB Gold: 10/09/2021 Neg    Okay to refill Orencia?

## 2021-11-16 NOTE — Telephone Encounter (Signed)
Patients daughter called the office requesting a refill of Orencia '250mg'$  be sent to [facility]. She states the pharmacy is sending a fax to request the refill as well.

## 2021-11-17 MED ORDER — ORENCIA CLICKJECT 125 MG/ML ~~LOC~~ SOAJ: 125.0000 mg | SUBCUTANEOUS | 0 refills | Status: DC

## 2021-11-21 NOTE — Telephone Encounter (Signed)
Received return VM from patient's daughter. Called patient regarding Orencia again. Rx for Orencia was sent to [place] by Dr. Estanislado Pandy.  Patient's daughter Olin Hauser states they talked to pharmacy yesterday and were told PA was required. I advised her to call pharmacy back and ensure they are processing through her [place] first and also that they are processing for month supply since that is what insurance approved. She verbalized understanding.  Knox Saliva, PharmD, MPH, BCPS, CPP Clinical Pharmacist (Rheumatology and Pulmonology)

## 2021-12-13 ENCOUNTER — Other Ambulatory Visit: Payer: Self-pay | Admitting: Rheumatology

## 2021-12-20 NOTE — Progress Notes (Unsigned)
Office Visit Note  Patient: Melanie Garrett             Date of Birth: 04/27/1956           MRN: [id number]             PCP: Janith Lima, MD Referring: Janith Lima, MD Visit Date: 01/02/2022 Occupation: _0 @  Subjective:  Left ankle joint pain and swelling   History of Present Illness: Melanie Garrett is a 66 y.o. female with history of seropositive rheumatoid arthritis and osteoarthritis. She is on orencia 125 mg sq injections once weekly and arava 20 mg daily.  Patient reports that she has been on Orencia consistently since December 2022.  She was started on arava on April 2023 and has been tolerating it without any side effects.  At her last office visit on 10/30/2021 she was given a prednisone taper starting at 10 mg tapering by 2.5 mg every week.  She was given an additional course of prednisone starting at 10 mg tapering by 2.5 mg every 4 days on 11/14/2021.  She has completed the prednisone taper and states that the pain and swelling in her hands has resolved.  She continues to have persistent pain and swelling in the left ankle joint.  She is also having discomfort and stiffness in both knee joints especially when rising from a seated position.  She takes tramadol and occasionally ibuprofen for symptomatic relief.     Activities of Daily Living:  Patient reports morning stiffness for 5-10 minutes.   Patient Reports nocturnal pain.  Difficulty dressing/grooming: Denies Difficulty climbing stairs: Denies Difficulty getting out of chair: Denies Difficulty using hands for taps, buttons, cutlery, and/or writing: Reports  Review of Systems  Constitutional:  Negative for fatigue.  HENT:  Negative for mouth sores and mouth dryness.   Eyes:  Negative for dryness.  Respiratory:  Negative for shortness of breath.   Cardiovascular:  Negative for chest pain and palpitations.  Gastrointestinal:  Negative for blood in stool, constipation and diarrhea.  Endocrine: Negative for  increased urination.  Genitourinary:  Negative for involuntary urination.  Musculoskeletal:  Positive for joint pain, joint pain, joint swelling and morning stiffness. Negative for myalgias, muscle weakness, muscle tenderness and myalgias.  Skin:  Negative for color change, rash, hair loss and sensitivity to sunlight.  Allergic/Immunologic: Negative for susceptible to infections.  Neurological:  Negative for dizziness and headaches.  Hematological:  Negative for swollen glands.  Psychiatric/Behavioral:  Negative for depressed mood and sleep disturbance. The patient is not nervous/anxious.     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   Intractable migraine without aura and without status migrainosus 08/21/2020   Screening for cervical cancer 08/21/2020   Screen for colon cancer 08/16/2020   Visit for screening mammogram 08/16/2020   Hypokalemia due to inadequate potassium intake 08/16/2020   Osteoarthritis 02/09/2020   Obesity (BMI 30.0-34.9) 06/29/2018   Urticaria, idiopathic 09/02/2016   Routine general medical examination at a health care facility 09/02/2016   Eczema, dyshidrotic 02/02/2015    Past Medical History:  Diagnosis Date   Osteoarthritis 02/09/2020   RA (rheumatoid arthritis) ([place]) 04/05/2021    Family History  Problem Relation Age of Onset   Arthritis Mother    Lupus Sister    Lupus Brother  Seizures Brother    Hypertension Daughter    Arthritis Daughter    Hypertension Daughter    Arthritis Daughter    Alcohol abuse Neg Hx    Cancer Neg Hx    Diabetes Neg Hx    Drug abuse Neg Hx    Early death Neg Hx    Heart disease Neg Hx    Stroke Neg Hx    Past Surgical History:  Procedure Laterality Date   KNEE SURGERY Left    acl repair   Social History   Social History Narrative    Not on file   Immunization History  Administered Date(s) Administered   Influenza,inj,Quad PF,6+ Mos 06/29/2018, 03/08/2021   Influenza-Unspecified 02/23/2015, 03/24/2016, 03/24/2017   PNEUMOCOCCAL CONJUGATE-20 07/12/2021   Td 04/27/2010   Tdap 05/04/2013     Objective: Vital Signs: BP 137/88 (BP Location: Left Arm, Patient Position: Sitting, Cuff Size: Normal)   Pulse 81   Ht _0  (1.6 m)   Wt 150 lb 6.4 oz (68.2 kg)   BMI 26.64 kg/m    Physical Exam Vitals and nursing note reviewed.  Constitutional:      Appearance: She is well-developed.  HENT:     Head: Normocephalic and atraumatic.  Eyes:     Conjunctiva/sclera: Conjunctivae normal.  Cardiovascular:     Rate and Rhythm: Normal rate and regular rhythm.     Heart sounds: Normal heart sounds.  Pulmonary:     Effort: Pulmonary effort is normal.     Breath sounds: Normal breath sounds.  Abdominal:     General: Bowel sounds are normal.     Palpations: Abdomen is soft.  Musculoskeletal:     Cervical back: Normal range of motion.  Skin:    General: Skin is warm and dry.     Capillary Refill: Capillary refill takes less than 2 seconds.  Neurological:     Mental Status: She is alert and oriented to person, place, and time.  Psychiatric:        Behavior: Behavior normal.      Musculoskeletal Exam: Patient remained seated during examination today.  C-spine has good range of motion.  No midline spinal tenderness.  Shoulder joints have good range of motion with no discomfort.  Elbow joints have good range of motion with no tenderness or synovitis.  Synovial thickening of MCP joints.  Painful range of motion of both knee joints.  Tenderness and swelling of the left ankle noted.  No tenderness over MTP joints.  CDAI Exam: CDAI Score: 3  Patient Global: 5 mm; Provider Global: 5 mm Swollen: 1 ; Tender: 3  Joint Exam 01/02/2022      Right  Left  Knee   Tender   Tender  Ankle     Swollen Tender     Investigation: No  additional findings.  Imaging: No results found.  Recent Labs: Lab Results  Component Value Date   WBC 7.3 10/30/2021   HGB 10.6 (L) 10/30/2021   PLT 235 10/30/2021   NA 139 10/30/2021   K 3.7 10/30/2021   CL 103 10/30/2021   CO2 27 10/30/2021   GLUCOSE 82 10/30/2021   BUN 12 10/30/2021   CREATININE 0.55 10/30/2021   BILITOT 0.5 10/30/2021   ALKPHOS 66 03/08/2021   AST 9 (L) 10/30/2021   ALT 4 (L) 10/30/2021   PROT 6.6 10/30/2021   ALBUMIN 3.9 03/08/2021   CALCIUM 9.3 10/30/2021   QFTBGOLDPLUS NEGATIVE 10/09/2021    Speciality Comments: dxd 08/21 [place]  rheum MTX, Arava-inadequate response.  Intermittent use of Orencia due to positive TB Gold, last dose June 2022  rifampin 08/2022X 4 months  Procedures:  Medium Joint Inj: L ankle on 01/02/2022 11:40 AM Indications: pain and joint swelling Details: 27 G 1.5 in needle, ultrasound-guided Medications: 30 mg triamcinolone acetonide 40 MG/ML; 1 mL lidocaine 1 % Aspirate: 0 mL Outcome: tolerated well, no immediate complications Procedure, treatment alternatives, risks and benefits explained, specific risks discussed. Consent was given by the patient. Immediately prior to procedure a time out was called to verify the correct patient, procedure, equipment, support staff and site/side marked as required. Patient was prepped and draped in the usual sterile fashion.     Allergies: Patient has no known allergies.   Assessment / Plan:     Visit Diagnoses: Seropositive rheumatoid arthritis (HCC)+RF,+CCP Ab - positive ANA, elevated sedimentation rate, severe synovitis.  DXd 08/21 at [place] [facility]: Patient presents today with tenderness and synovitis of the left ankle joint on examination.  She continues to experience pain and stiffness in both knee joints intermittently especially when rising from a seated position.  She is currently on Orencia 125 mg sq injections once weekly and Arava 20 mg daily.  She has been on Orencia consistently since  December 2022 and was started on Arava in April 2023.  She has been tolerating combination therapy without any side effects, injection site reactions, or recurrent infections.  She was given a prednisone taper on 10/30/2021 and a short taper was prescribed on 11/14/2021.  She noticed significant improvement in her joint pain and inflammation while taking prednisone.  The pain and swelling in her hands has resolved but she has persistent inflammation in the left ankle joint.  Different treatment options were discussed today in detail.  It appears that TNF inhibitors were avoided in the past due to family history of systemic lupus.  Discussed that Actemra may be a treatment option in the future if she continues to have recurrent flares on Orencia and Arava combination.  She does not want to make any medication changes at this time and would like to give [place] more time to assess for the full efficacy.  The left ankle joint was injected with cortisone today under ultrasound guidance. ESR, CBC, and CMP updated today. She was given a handout of information about Actemra subcutaneous injections to further review.  If she would like to switch from Orencia to Actemra prior to her scheduled follow-up visit she will notify us for [place] to reapply for Actemra through her insurance.  She will follow-up in the office in 2 months or sooner if needed.   - Plan: Sedimentation rate  High risk medication use - Arava 20 mg 1 tablet by mouth daily and orencia 125 mg sq injections once weekly.   Patient was given information about Actemra to review.  She will notify us if she would like [place] to apply for Actemra through her insurance prior to her next scheduled follow-up visit. Inadequate response to oral and injectable Methotrexate.  Intermittent use of Orencia due to positive TB Gold-Patient has been on orencia consistently since December 2022.   Arava initiated in April 2023-no SE.  CBC and CMP updated on 10/30/21.  CBC and CMP were  ordered today.  Her next lab work will be due in October and every 3 months to monitor for drug toxicity. - Plan: CBC with Differential/Platelet, COMPLETE METABOLIC PANEL WITH GFR TB gold negative on 10/09/21 and will continue to monitor yearly.  She has not had any recent or recurrent infections. Discussed the importance of holding orencia and arava if she develops signs or symptoms of an infection and to resume once the infection has completely cleared.   TB lung, latent: Notes from health department were reviewed.  Patient was treated with rifampin for total of 4 months.   Primary osteoarthritis of both hands: PIP and DIP thickening consistent with OA of both hands. The pain and inflammation in her hands and wrists have improved.    Primary osteoarthritis of both knees: She has intermittent pain and stiffness in both knee joints.  Her symptoms are exacerbated by rising from a seated position.    Primary osteoarthritis of both feet: She presents today with pain and inflammation in the left ankle joint.  Her right ankle joint pain and swelling resolved taking a prednisone taper.  An ultrasound guided left ankle cortisone injection was performed today in the office.   Pain in left ankle and joints of left foot -She has tenderness and synovitis of the left ankle joint on examination today.  She completed a prednisone taper a few weeks ago in her left ankle joint pain and inflammation have returned.  Patient requested a left ankle joint cortisone injection.  Procedure note completed above.  Aftercare was discussed.  If she continues to have recurrent flares we will need to discuss other treatment options besides Orencia and Arava combination.  Plan: US Guided Needle Placement  Other medical conditions are listed as follows:   Urticaria, idiopathic  Eczema, dyshidrotic  Intractable migraine without aura and without status migrainosus  Family history of systemic lupus erythematosus, Mother and  siblings     Orders: Orders Placed This Encounter  Procedures   Medium Joint Inj   US Guided Needle Placement   CBC with Differential/Platelet   COMPLETE METABOLIC PANEL WITH GFR   Sedimentation rate   No orders of the defined types were placed in this encounter.    Follow-Up Instructions: Return in 2 months (on 03/05/2022) for Rheumatoid arthritis, Osteoarthritis.   Ofilia Neas, PA-C  Note - This record has been created using Dragon software.  Chart creation errors have been sought, but may not always  have been located. Such creation errors do not reflect on  the standard of medical care.

## 2022-01-02 ENCOUNTER — Ambulatory Visit: Payer: Medicare Other | Admitting: Physician Assistant

## 2022-01-02 ENCOUNTER — Encounter: Payer: Self-pay | Admitting: Physician Assistant

## 2022-01-02 ENCOUNTER — Encounter: Payer: Self-pay | Admitting: *Deleted

## 2022-01-02 ENCOUNTER — Ambulatory Visit: Payer: Self-pay

## 2022-01-02 VITALS — BP 137/88 | HR 81 | Ht 63.0 in | Wt 150.4 lb

## 2022-01-02 DIAGNOSIS — M19041 Primary osteoarthritis, right hand: Secondary | ICD-10-CM | POA: Diagnosis not present

## 2022-01-02 DIAGNOSIS — Z227 Latent tuberculosis: Secondary | ICD-10-CM

## 2022-01-02 DIAGNOSIS — M19071 Primary osteoarthritis, right ankle and foot: Secondary | ICD-10-CM

## 2022-01-02 DIAGNOSIS — Z79899 Other long term (current) drug therapy: Secondary | ICD-10-CM | POA: Diagnosis not present

## 2022-01-02 DIAGNOSIS — M170 Bilateral primary osteoarthritis of knee: Secondary | ICD-10-CM

## 2022-01-02 DIAGNOSIS — G43019 Migraine without aura, intractable, without status migrainosus: Secondary | ICD-10-CM

## 2022-01-02 DIAGNOSIS — L501 Idiopathic urticaria: Secondary | ICD-10-CM

## 2022-01-02 DIAGNOSIS — M059 Rheumatoid arthritis with rheumatoid factor, unspecified: Secondary | ICD-10-CM

## 2022-01-02 DIAGNOSIS — R7612 Nonspecific reaction to cell mediated immunity measurement of gamma interferon antigen response without active tuberculosis: Secondary | ICD-10-CM

## 2022-01-02 DIAGNOSIS — M19042 Primary osteoarthritis, left hand: Secondary | ICD-10-CM

## 2022-01-02 DIAGNOSIS — L301 Dyshidrosis [pompholyx]: Secondary | ICD-10-CM

## 2022-01-02 DIAGNOSIS — M19072 Primary osteoarthritis, left ankle and foot: Secondary | ICD-10-CM

## 2022-01-02 DIAGNOSIS — M25572 Pain in left ankle and joints of left foot: Secondary | ICD-10-CM

## 2022-01-02 DIAGNOSIS — Z8269 Family history of other diseases of the musculoskeletal system and connective tissue: Secondary | ICD-10-CM | POA: Diagnosis not present

## 2022-01-02 MED ORDER — TRIAMCINOLONE ACETONIDE 40 MG/ML IJ SUSP
30.0000 mg | INTRAMUSCULAR | Status: AC | PRN
  Administered 2022-01-02: 30 mg via INTRA_ARTICULAR

## 2022-01-02 MED ORDER — LIDOCAINE HCL 1 % IJ SOLN
1.0000 mL | INTRAMUSCULAR | Status: AC | PRN
  Administered 2022-01-02: 1 mL

## 2022-01-02 NOTE — Patient Instructions (Addendum)
Standing Labs We placed an order today for your standing lab work.   Please have your standing labs drawn in October and every 3 months   If possible, please have your labs drawn 2 weeks prior to your appointment so that the provider can discuss your results at your appointment.  Please note that you may see your imaging and lab results in [facility] before we have reviewed them. We may be awaiting multiple results to interpret others before contacting you. Please allow our office up to 72 hours to thoroughly review all of the results before contacting the office for clarification of your results.  We have open lab daily: Monday through Thursday from 1:30-4:30 PM and Friday from 1:30-4:00 PM at the office of Dr. Bo Merino, [facility].   Please be advised, all patients with office appointments requiring lab work will take precedent over walk-in lab work.  If possible, please come for your lab work on Monday and Friday afternoons, as you may experience shorter wait times. The office is located at [address], [place], [place], [place] [address] No appointment is necessary.   Labs are drawn by [facility]. Please bring your co-pay at the time of your lab draw.  You may receive a bill from [facility] for your lab work.  Please note if you are on Hydroxychloroquine and and an order has been placed for a Hydroxychloroquine level, you will need to have it drawn 4 hours or more after your last dose.  If you wish to have your labs drawn at another location, please call the office 24 hours in advance to send orders.  If you have any questions regarding directions or hours of operation,  please call [phone].   As a reminder, please drink plenty of water prior to coming for your lab work. Thanks!   Tocilizumab Injection What is this medication? TOCILIZUMAB (TOE si LIZ ue mab) treats rheumatoid arthritis and juvenile idiopathic arthritis. It may also be used to treat giant cell  arteritis, cytokine release syndrome, and to slow a decrease in lung function for patients with systemic sclerosis-related lung disease. It can also be used to treat COVID-19. This medication works on the immune system. This medicine may be used for other purposes; ask your health care provider or pharmacist if you have questions. COMMON BRAND NAME(S): Actemra What should I tell my care team before I take this medication? They need to know if you have any of these conditions: cancer diabetes (high blood sugar) heart disease hepatitis B or history of hepatitis B infection high blood pressure high cholesterol immune system problems infection especially a viral infection such as chickenpox, cold sores, or herpes infection such as tuberculosis (TB) or other bacterial, fungal or viral infections liver disease low blood counts, like low white cell, platelet, or red cell counts multiple sclerosis recent or upcoming vaccine stomach or intestine problems stroke an unusual or allergic reaction to tocilizumab, other medicines, foods, dyes, or preservatives pregnant or trying to get pregnant breast-feeding How should I use this medication? This medicine is injected into a vein or under the skin. It is usually given by a health care provider in a hospital or clinic setting. If you get this medicine at home, you will be taught how to prepare and give it. Use exactly as directed. Take it as directed on the prescription label at the same time every day. Keep taking it unless your health care provider tells you to stop. If you use a pen,  be sure to take off the outer needle cover before using the dose. It is important that you put your used needles and syringes in a special sharps container. Do not put them in a trash can. If you do not have a sharps container, call your pharmacist or health care provider to get one. A special MedGuide will be given to you before each treatment or by the pharmacist with  each prescription and refill. Be sure to read this information carefully each time. Talk to your health care provider regarding the use of this medicine in children. While the drug may be prescribed for children as young as 2 years for selected conditions, precautions do apply. Overdosage: If you think you have taken too much of this medicine contact a poison control center or emergency room at once. NOTE: This medicine is only for you. Do not share this medicine with others. What if I miss a dose? It is important not to miss your dose. Call your health care provider if you are unable to keep an appointment. If you give yourself the medicine at home and you miss a dose, take it as soon as you can. If it is almost time for your next dose, take only that dose. Do not take double or extra doses. Call your health care provider with questions. What may interact with this medication? Do not take this medicine with any of the following medications: live virus vaccines This medicine may also interact with the following medications: biologic medicines such as abatacept, adalimumab, anakinra, certolizumab, etanercept, golimumab, infliximab, rituximab, secukinumab, ustekinumab birth control pills certain medicines for cholesterol like atorvastatin, lovastatin, and simvastatin cyclosporine omeprazole steroid medicines like prednisone or cortisone theophylline vaccines warfarin This list may not describe all possible interactions. Give your health care provider a list of all the medicines, herbs, non-prescription drugs, or dietary supplements you use. Also tell them if you smoke, drink alcohol, or use illegal drugs. Some items may interact with your medicine. What should I watch for while using this medication? Your condition will be monitored carefully while you are receiving this medicine. Tell your health care provider if your symptoms do not start to get better or if they get worse. You may need blood  work done while you are taking this medicine. You will be tested for tuberculosis (TB) before you start this medicine. If your doctor prescribes any medicine for TB, you should start taking the TB medicine before starting this medicine. Make sure to finish the full course of TB medicine. This medicine may increase your risk of getting an infection. Call your health care provider for advice if you get a fever, chills, sore throat, or other symptoms of a cold or flu. Do not treat yourself. Try to avoid being around people who are sick. Talk to your health care provider about your risk of cancer. You may be more at risk for certain types of cancers if you take this medicine. What side effects may I notice from receiving this medication? Side effects that you should report to your doctor or health care professional as soon as possible: allergic reactions (skin rash, itching or hives; swelling of the face, lips, or tongue) changes in vision increase in blood pressure infection (fever, chills, cough, sore throat, pain or trouble passing urine) light-colored stool liver injury (dark yellow or brown urine; general ill feeling or flu-like symptoms; loss of appetite, right upper belly pain; unusually weak or tired; yellowing of the eyes or skin) low red  blood cell counts (trouble breathing; feeling faint; lightheaded, falls; unusually weak or tired) pain, tingling, numbness in the hands or feet tears in the stomach or intestines (fever; stomach pain; sudden change in bowel habits) trouble breathing unusual bruising or bleeding Side effects that usually do not require medical attention (report to your doctor or health care professional if they continue or are bothersome): dizziness headache pain, redness, or irritation at site where injected This list may not describe all possible side effects. Call your doctor for medical advice about side effects. You may report side effects to FDA at  1-800-FDA-1088. Where should I keep my medication? Keep out of the reach of children and pets. If you are using this medicine at home, you will be instructed on how to store this medicine. Throw away any unused medicine after the expiration date on the label. To get rid of medicines that are no longer needed or have expired: Take the medicine to a medicine take-back program. Check with your pharmacy or law enforcement to find a location. If you cannot return the medicine, ask your pharmacist or health care provider how to get rid of this medicine safely. NOTE: This sheet is a summary. It may not cover all possible information. If you have questions about this medicine, talk to your doctor, pharmacist, or health care provider.  2023 Elsevier/Gold Standard (2021-06-14 00:00:00)

## 2022-01-03 LAB — COMPLETE METABOLIC PANEL WITH GFR
AG Ratio: 1.1 (calc) (ref 1.0–2.5)
ALT: 6 U/L (ref 6–29)
AST: 17 U/L (ref 10–35)
Albumin: 3.5 g/dL — ABNORMAL LOW (ref 3.6–5.1)
Alkaline phosphatase (APISO): 67 U/L (ref 37–153)
BUN: 15 mg/dL (ref 7–25)
CO2: 27 mmol/L (ref 20–32)
Calcium: 9.7 mg/dL (ref 8.6–10.4)
Chloride: 108 mmol/L (ref 98–110)
Creat: 0.56 mg/dL (ref 0.50–1.05)
Globulin: 3.1 g/dL (calc) (ref 1.9–3.7)
Glucose, Bld: 64 mg/dL — ABNORMAL LOW (ref 65–99)
Potassium: 3.9 mmol/L (ref 3.5–5.3)
Sodium: 143 mmol/L (ref 135–146)
Total Bilirubin: 0.3 mg/dL (ref 0.2–1.2)
Total Protein: 6.6 g/dL (ref 6.1–8.1)
eGFR: 101 mL/min/{1.73_m2} (ref >=60)

## 2022-01-03 LAB — CBC WITH DIFFERENTIAL/PLATELET
Absolute Monocytes: 431 cells/uL (ref 200–950)
Basophils Absolute: 29 cells/uL (ref 0–200)
Basophils Relative: 0.6 %
Eosinophils Absolute: 123 cells/uL (ref 15–500)
Eosinophils Relative: 2.5 %
HCT: 38.4 % (ref 35.0–45.0)
Hemoglobin: 12.4 g/dL (ref 11.7–15.5)
Lymphs Abs: 1367 cells/uL (ref 850–3900)
MCH: 27 pg (ref 27.0–33.0)
MCHC: 32.3 g/dL (ref 32.0–36.0)
MCV: 83.5 fL (ref 80.0–100.0)
MPV: 10.4 fL (ref 7.5–12.5)
Monocytes Relative: 8.8 %
Neutro Abs: 2950 cells/uL (ref 1500–7800)
Neutrophils Relative %: 60.2 %
Platelets: 258 10*3/uL (ref 140–400)
RBC: 4.6 10*6/uL (ref 3.80–5.10)
RDW: 13.4 % (ref 11.0–15.0)
Total Lymphocyte: 27.9 %
WBC: 4.9 10*3/uL (ref 3.8–10.8)

## 2022-01-03 LAB — SEDIMENTATION RATE: Sed Rate: 28 mm/h (ref 0–30)

## 2022-01-03 NOTE — Progress Notes (Signed)
CBC WNL. Albumin borderline low.  Rest of CMP WNL.  ESR WNL.

## 2022-01-07 ENCOUNTER — Other Ambulatory Visit: Payer: Self-pay | Admitting: Internal Medicine

## 2022-01-07 DIAGNOSIS — M159 Polyosteoarthritis, unspecified: Secondary | ICD-10-CM

## 2022-01-07 MED ORDER — TRAMADOL HCL 50 MG PO TABS: 50.0000 mg | ORAL_TABLET | Freq: Four times a day (QID) | ORAL | 3 refills | Status: DC | PRN

## 2022-01-07 NOTE — Telephone Encounter (Signed)
Ok to PCP please 

## 2022-01-24 ENCOUNTER — Other Ambulatory Visit: Payer: Self-pay | Admitting: Rheumatology

## 2022-01-24 MED ORDER — PREDNISONE 5 MG PO TABS: ORAL_TABLET | ORAL | 0 refills | Status: DC

## 2022-01-24 NOTE — Telephone Encounter (Signed)
Patient advised she should see her PCP for the calf pain to rule out DVT.  Patient advised we can send a prednisone taper for foot discomfort.  Prescription sent in for a prednisone taper starting at 20 mg and taper by 5 mg every 2 days.  Patient advised the side effects of prednisone include hypertension, increase in blood glucose level, weight gain, and osteoporosis.

## 2022-01-24 NOTE — Telephone Encounter (Signed)
Patient states her left foot, right calf pain. Patient states the pain in her calf started yesterday. Patient states her feet have been hurting since January 02, 2022. Patient states she does not recall any injury. Patient is on [place] and her last injection was 01/17/2022. She is due for an injection today. Patient has been on this for about 1 years. Patient states she is also on [place] and taking it as prescribed. Patient states she elevated her feet and has improvement with the pain then. Patient is requesting a prescription of Prednisone. Patient has a follow up on 03/06/2022. Please advise.

## 2022-01-24 NOTE — Telephone Encounter (Signed)
Patient's daughter Melanie Garrett called stating her mom is still experiencing left foot, right calf pain and doesn't feel like the Orencia medication is working.  She has appointment scheduled for 03/06/22 at 10:00 and is not sure if she needs to be seen sooner so they can change her medication or if Dr. Estanislado Pandy wants to send in a prescription of Prednisone until her September appointment.

## 2022-01-24 NOTE — Telephone Encounter (Signed)
She should see her PCP for the calf pain to rule out DVT.  We can send a prednisone taper for foot discomfort.  Please send a prednisone taper starting at 20 mg and taper by 5 mg every 2 days.  Please advise the side effects of prednisone include hypertension, increase in blood glucose level, weight gain, and osteoporosis.

## 2022-02-03 ENCOUNTER — Other Ambulatory Visit: Payer: Self-pay | Admitting: Rheumatology

## 2022-02-04 NOTE — Telephone Encounter (Signed)
Next Visit: 03/06/2022  Last Visit: 01/02/2022  Last Fill: 11/17/2021  ZC:[id number] rheumatoid arthritis   Current Dose per office note 01/02/2022: orencia 125 mg sq injections once weekly  Labs: 01/02/2022 CBC WNL. Albumin borderline low.  Rest of CMP WNL.  ESR WNL.    TB Gold: 10/09/2021 Neg    Okay to refill Orencia?

## 2022-02-04 NOTE — Telephone Encounter (Signed)
Next Visit: 03/06/2022  Last Visit: 01/02/2022  Last Fill: 11/14/2021  DX: Seropositive rheumatoid arthritis   Current Dose per office note 01/02/2022: Arava 20 mg 1 tablet by mouth daily   Labs: 01/02/2022 CBC WNL. Albumin borderline low.  Rest of CMP WNL.  ESR WNL.    Okay to refill Arava?  

## 2022-02-20 NOTE — Progress Notes (Unsigned)
Office Visit Note  Patient: Melanie Garrett             Date of Birth: 12/30/1955           MRN: [id number]             PCP: Janith Lima, MD Referring: Janith Lima, MD Visit Date: 03/06/2022 Occupation: '@GUAROCC'$ @  Subjective:  Discussed medication options.  History of Present Illness: Melanie Garrett is a 66 y.o. female with history of seropositive rheumatoid arthritis and osteoarthritis. She remains on Arava 20 mg 1 tablet by mouth daily and orencia 125 mg sq injections once weekly.  She took a prednisone taper starting on 01/24/2022 due to continuing to experience recurrent flares.  She has ongoing pain and swelling in the left ankle joint and intermittent pain and swelling in both hands and both wrist joints.  She has been taking tramadol as needed for pain relief but it does not subside her discomfort.  She would like to discuss other treatment options.  She is ready to proceed with switching, Orencia to Actemra.    Activities of Daily Living:  Patient reports morning stiffness for 10-15 minutes.   Patient Reports nocturnal pain.  Difficulty dressing/grooming: Denies Difficulty climbing stairs: Reports Difficulty getting out of chair: Reports Difficulty using hands for taps, buttons, cutlery, and/or writing: Denies  Review of Systems  Constitutional:  Negative for fatigue.  HENT:  Negative for mouth sores and mouth dryness.   Eyes:  Negative for dryness.  Respiratory:  Negative for shortness of breath.   Cardiovascular:  Negative for chest pain and palpitations.  Gastrointestinal:  Negative for blood in stool, constipation and diarrhea.  Endocrine: Negative for increased urination.  Genitourinary:  Negative for involuntary urination.  Musculoskeletal:  Positive for joint pain, joint pain, joint swelling and morning stiffness. Negative for gait problem, myalgias, muscle weakness, muscle tenderness and myalgias.  Skin:  Negative for color change, rash, hair loss and  sensitivity to sunlight.  Allergic/Immunologic: Negative for susceptible to infections.  Neurological:  Negative for dizziness and headaches.  Hematological:  Negative for swollen glands.  Psychiatric/Behavioral:  Positive for sleep disturbance. Negative for depressed mood. The patient is not nervous/anxious.     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   Intractable migraine without aura and without status migrainosus 08/21/2020   Screening for cervical cancer 08/21/2020   Screen for colon cancer 08/16/2020   Visit for screening mammogram 08/16/2020   Hypokalemia due to inadequate potassium intake 08/16/2020   Osteoarthritis 02/09/2020   Obesity (BMI 30.0-34.9) 06/29/2018   Urticaria, idiopathic 09/02/2016   Routine general medical examination at a health care facility 09/02/2016   Eczema, dyshidrotic 02/02/2015    Past Medical History:  Diagnosis Date   Osteoarthritis 02/09/2020   RA (rheumatoid arthritis) ([place] ) 04/05/2021    Family History  Problem Relation Age of Onset   Arthritis Mother    Lupus Sister    Lupus Brother    Seizures Brother    Hypertension Daughter    Arthritis Daughter    Hypertension Daughter    Arthritis Daughter    Alcohol abuse Neg Hx    Cancer Neg Hx    Diabetes Neg Hx    Drug abuse Neg Hx    Early death Neg Hx  Heart disease Neg Hx    Stroke Neg Hx    Past Surgical History:  Procedure Laterality Date   KNEE SURGERY Left    acl repair   Social History   Social History Narrative   Not on file   Immunization History  Administered Date(s) Administered   Influenza,inj,Quad PF,6+ Mos 06/29/2018, 03/08/2021   Influenza-Unspecified 02/23/2015, 03/24/2016, 03/24/2017   PNEUMOCOCCAL CONJUGATE-20 07/12/2021   Td 04/27/2010   Tdap 05/04/2013      Objective: Vital Signs: BP (!) 144/87 (BP Location: Left Arm, Patient Position: Sitting, Cuff Size: Normal)   Pulse 76   Resp 14   Ht '5\' 3"'$  (1.6 m)   Wt 141 lb 12.8 oz (64.3 kg)   BMI 25.12 kg/m    Physical Exam Vitals and nursing note reviewed.  Constitutional:      Appearance: She is well-developed.  HENT:     Head: Normocephalic and atraumatic.  Eyes:     Conjunctiva/sclera: Conjunctivae normal.  Cardiovascular:     Rate and Rhythm: Normal rate and regular rhythm.     Heart sounds: Normal heart sounds.  Pulmonary:     Effort: Pulmonary effort is normal.     Breath sounds: Normal breath sounds.  Abdominal:     General: Bowel sounds are normal.     Palpations: Abdomen is soft.  Musculoskeletal:     Cervical back: Normal range of motion.  Skin:    General: Skin is warm and dry.     Capillary Refill: Capillary refill takes less than 2 seconds.  Neurological:     Mental Status: She is alert and oriented to person, place, and time.  Psychiatric:        Behavior: Behavior normal.      Musculoskeletal Exam: C-spine is limited range of motion with lateral rotation.  Shoulder joints have good range of motion with some stiffness but no discomfort currently.  Elbow joints have good range of motion with no tenderness or synovitis.  Synovial thickening of MCP joints.  Some tenderness and synovitis of the right second and third MCP joints.  Tenderness and inflammation in the left fourth PIP joint.  PIP and DIP thickening consistent with osteoarthritis of both hands.  Hip joints have good range of motion with no groin pain.  Knee joints have good ROM with no warmth or effusion. Tenderness and synovitis of the left ankle joint.   CDAI Exam: CDAI Score: 7.4  Patient Global: 8 mm; Provider Global: 6 mm Swollen: 4 ; Tender: 4  Joint Exam 03/06/2022      Right  Left  MCP 2  Swollen Tender     MCP 3  Swollen Tender     PIP 4     Swollen Tender  Ankle     Swollen Tender   There is  currently no information documented on the homunculus. Go to the Rheumatology activity and complete the homunculus joint exam.  Investigation: No additional findings.  Imaging: No results found.  Recent Labs: Lab Results  Component Value Date   WBC 4.9 01/02/2022   HGB 12.4 01/02/2022   PLT 258 01/02/2022   NA 143 01/02/2022   K 3.9 01/02/2022   CL 108 01/02/2022   CO2 27 01/02/2022   GLUCOSE 64 (L) 01/02/2022   BUN 15 01/02/2022   CREATININE 0.56 01/02/2022   BILITOT 0.3 01/02/2022   ALKPHOS 66 03/08/2021   AST 17 01/02/2022   ALT 6 01/02/2022   PROT 6.6 01/02/2022   ALBUMIN  3.9 03/08/2021   CALCIUM 9.7 01/02/2022   QFTBGOLDPLUS NEGATIVE 10/09/2021    Speciality Comments: dxd 08/21 [place] [facility] MTX, Arava-inadequate response.  Intermittent use of Orencia due to positive TB Gold, last dose June 2022  rifampin 08/2022X 4 months  Procedures:  No procedures performed Allergies: Patient has no known allergies.   Assessment / Plan:     Visit Diagnoses: Seropositive rheumatoid arthritis (HCC)+RF,+CCP Ab - positive ANA, elevated sedimentation rate, severe synovitis.  DXd 08/21 at [place] [facility]: Patient presents today with ongoing tenderness and synovitis of the left ankle joint.  She continues to have recurrent flares requiring several rounds of prednisone.  She is currently on Orencia 125 mg sq injections once weekly and Arava 20 mg 1 tablet by mouth daily.  She has consistently been on Orencia since December 2022 and arava since April 2023.  The left ankle joint was injected with cortisone at her last office visit on 01/02/2022.  She had temporary relief but her symptoms have returned.  Her last prednisone taper was sent to the pharmacy on 01/24/2022 which she completed.  She has been taking tramadol as needed for pain relief but it has inadequately been controlling her symptoms.  At her last office visit we discussed switching from [place] to Actemra.  She is ready to proceed with applying  for Actemra through her insurance.  Indications, contraindications, potential side effects of Actemra were discussed today in detail.  All questions were addressed and consent was obtained.  We will apply for Actemra through her insurance and once approved she will return to the office for administration of the first injection.  She will remain on arava as prescribed.  A prednisone taper starting at 20 mg tapering by 5 mg every 4 days was sent to the pharmacy today.  She will follow-up in the office in 6 to 8 weeks.  Medication counseling:   Baseline Immunosuppressant Therapy Labs     Latest Ref Rng & Units 10/09/2021   11:11 AM  Quantiferon TB Gold  Quantiferon TB Gold Plus NEGATIVE NEGATIVE        Latest Ref Rng & Units 10/09/2021   11:11 AM  Hepatitis  Hep B Surface Ag NON-REACTIVE NON-REACTIVE   Hep B IgM NON-REACTIVE NON-REACTIVE   Hep C Ab NON-REACTIVE NON-REACTIVE     Lab Results  Component Value Date   HIV NON-REACTIVE 10/09/2021   HIV NON-REACTIVE 06/29/2018       Latest Ref Rng & Units 10/09/2021   11:11 AM  Immunoglobulin Electrophoresis  IgA  70 - 320 mg/dL 531   IgG 600 - 1,540 mg/dL 1,594   IgM 50 - 300 mg/dL 416        Latest Ref Rng & Units 01/02/2022   10:45 AM  Serum Protein Electrophoresis  Total Protein 6.1 - 8.1 g/dL 6.6     Lipid Panel Lab Results  Component Value Date   CHOL 212 (H) 08/16/2020   HDL 66.00 08/16/2020   LDLCALC 124 (H) 08/16/2020   TRIG 107.0 08/16/2020   CHOLHDL 3 08/16/2020     Chest x-ray: No active cardiopulmonary disease on 10/12/21  Counseled patient that Actemra is an IL-6 blocking agent.   Counseled patient on purpose, proper use, and adverse effects of Actemra.  Reviewed the most common adverse effects including infections, injection site reaction, bowel injury, and rarely cancer and conditions of the nervous system.  Reviewed that the medication should be held during infections.  Discussed that there is the possibility  of an increased risk of malignancy but it is not well understood if this increased risk is due to the medication or the disease state.  Counseled patient that Actemra should be held prior to scheduled surgery.  Counseled patient to avoid live vaccines while on Actemra.  Recommend annual influenza, Pneumovax 23, Prevnar 13, and Shingrix as indicated.   Reviewed the importance of regular labs while on Actemra therapy including the need for routine lipid panel.  Advised patient to get standing labs one month after starting Actemra.  Provided patient with standing lab orders. P rovided patient with medication education material and answered all questions.  Patient voiced understanding.  Patient consented to Actemra.  Will upload consent into the media tab.  Reviewed storage instructions of Actemra with patient.  Advised patient that initial Actemra injection must be given in the office.  Will apply for Actemra through patient's insurance.    Patient dose will be  162 mg every 14 days based on weight <100 kg .  Prescription pending lab results and/or insurance approval.  High risk medication use - Applying for Actemra 162 mg sq injections every 14 days.  She will remain on Arava 20 mg 1 tablet by mouth daily. Inadequate response to Orencia. CBC and CMP updated on 01/02/22. Orders for CBC and CMP released today.  Standing orders for CBC and CMP remain in place. - Plan: COMPLETE METABOLIC PANEL WITH GFR, CBC with Differential/Platelet, Lipid panel Lipid panel checked today prior to starting actemra.  TB gold negative 10/09/21.  Discussed the importance of holding arava and Actemra if she develops signs or symptoms of an infection and to resume once the infection has completely cleared.   Screening cholesterol level - Patient is fasting today.  Lipid panel updated prior to initiating actemra. Plan: Lipid panel  TB lung, latent: CXR on 10/12/21 no active cardiopulmonary disease.  TB gold negative on 10/09/21.    Primary osteoarthritis of both hands: She has PIP and DIP thickening consistent with osteoarthritis of both hands.  Primary osteoarthritis of both knees: Knee joints have good range of motion on examination today.  No warmth or effusion was noted.  Primary osteoarthritis of both feet: She continues to have chronic pain and inflammation in the left ankle joint.  Synovitis of the left ankle noted.  Pain in left ankle and joints of left foot: She presents today with ongoing pain, stiffness, and inflammation in the left ankle joint.  She had a cortisone injection performed on 01/02/2022 which provided temporary relief.  She took a prednisone taper which was prescribed on 01/24/2022 which also provided temporary relief.  She continues to have recurrent flares despite remaining on Orencia and Arava as prescribed.  She will be switching from Orencia to Actemra as discussed above.  A prednisone taper starting at 20 mg tapering by 5 mg every 4 days was sent to the pharmacy to treat her current flare.  Other medical conditions are listed as follows:   Urticaria, idiopathic  Intractable migraine without aura and without status migrainosus  Eczema, dyshidrotic  Family history of systemic lupus erythematosus, mother and brother - Mother and siblings    Orders: Orders Placed This Encounter  Procedures   COMPLETE METABOLIC PANEL WITH GFR   CBC with Differential/Platelet   Lipid panel   Meds ordered this encounter  Medications   predniSONE (DELTASONE) 5 MG tablet    Sig: Take 4 tablets by mouth daily x4 days, 3 tablets daily x4 days, 2 tablets daily  x4 days, 1 tablet daily x4 days.    Dispense:  40 tablet    Refill:  0     Follow-Up Instructions: Return in about 6 weeks (around 04/17/2022) for Rheumatoid arthritis, Osteoarthritis.   Ofilia Neas, PA-C  Note - This record has been created using Dragon software.  Chart creation errors have been sought, but may not always  have been located.  Such creation errors do not reflect on  the standard of medical care.

## 2022-02-28 ENCOUNTER — Other Ambulatory Visit: Payer: Self-pay | Admitting: Physician Assistant

## 2022-03-06 ENCOUNTER — Telehealth: Payer: Self-pay | Admitting: Pharmacist

## 2022-03-06 ENCOUNTER — Ambulatory Visit: Payer: Medicare Other | Attending: Physician Assistant | Admitting: Physician Assistant

## 2022-03-06 ENCOUNTER — Encounter: Payer: Self-pay | Admitting: Physician Assistant

## 2022-03-06 VITALS — BP 144/87 | HR 76 | Resp 14 | Ht 63.0 in | Wt 141.8 lb

## 2022-03-06 DIAGNOSIS — Z13220 Encounter for screening for lipoid disorders: Secondary | ICD-10-CM | POA: Diagnosis not present

## 2022-03-06 DIAGNOSIS — M25572 Pain in left ankle and joints of left foot: Secondary | ICD-10-CM | POA: Diagnosis not present

## 2022-03-06 DIAGNOSIS — M19042 Primary osteoarthritis, left hand: Secondary | ICD-10-CM

## 2022-03-06 DIAGNOSIS — Z8269 Family history of other diseases of the musculoskeletal system and connective tissue: Secondary | ICD-10-CM | POA: Diagnosis not present

## 2022-03-06 DIAGNOSIS — M170 Bilateral primary osteoarthritis of knee: Secondary | ICD-10-CM | POA: Diagnosis not present

## 2022-03-06 DIAGNOSIS — L301 Dyshidrosis [pompholyx]: Secondary | ICD-10-CM

## 2022-03-06 DIAGNOSIS — Z79899 Other long term (current) drug therapy: Secondary | ICD-10-CM

## 2022-03-06 DIAGNOSIS — Z227 Latent tuberculosis: Secondary | ICD-10-CM

## 2022-03-06 DIAGNOSIS — M059 Rheumatoid arthritis with rheumatoid factor, unspecified: Secondary | ICD-10-CM | POA: Diagnosis not present

## 2022-03-06 DIAGNOSIS — M19072 Primary osteoarthritis, left ankle and foot: Secondary | ICD-10-CM

## 2022-03-06 DIAGNOSIS — M19071 Primary osteoarthritis, right ankle and foot: Secondary | ICD-10-CM

## 2022-03-06 DIAGNOSIS — L501 Idiopathic urticaria: Secondary | ICD-10-CM

## 2022-03-06 DIAGNOSIS — G43019 Migraine without aura, intractable, without status migrainosus: Secondary | ICD-10-CM | POA: Diagnosis not present

## 2022-03-06 DIAGNOSIS — M19041 Primary osteoarthritis, right hand: Secondary | ICD-10-CM

## 2022-03-06 MED ORDER — PREDNISONE 5 MG PO TABS: ORAL_TABLET | ORAL | 0 refills | Status: DC

## 2022-03-06 NOTE — Progress Notes (Signed)
Hgb and hct are slightly low.  MCH is borderline low.  Please encourage the patient to take a multivitamin with iron.   CBC and CMP will be rechecked 1 month after starting on actemra.

## 2022-03-06 NOTE — Telephone Encounter (Signed)
Please start Actemra SQ BIV.  Dose: '162mg'$  SQ every 14 days  Dx: RA  Previously tried therapies: Orencia - waning clinical response, recurrent prednisone taper Leflunomide (current - inadequate response) MTX - inadequate response  Therapies patient unable to try: TNF inhibitors (Humira, Enbrel, Simponi, Cimzia, Remicade) - family history of systemic lupus  Knox Saliva, PharmD, MPH, BCPS, CPP Clinical Pharmacist (Rheumatology and Pulmonology)

## 2022-03-06 NOTE — Progress Notes (Signed)
Pharmacy Note Subjective: Patient presents today to the [facility] for follow up office visit. Patient seen by the pharmacist for counseling on Actemra for rheumatoid arthritis.  History of hyperlipidemia: No  History of diverticulitis: No  Objective: CBC    Component Value Date/Time   WBC 4.9 01/02/2022 1045   RBC 4.60 01/02/2022 1045   HGB 12.4 01/02/2022 1045   HCT 38.4 01/02/2022 1045   PLT 258 01/02/2022 1045   MCV 83.5 01/02/2022 1045   MCH 27.0 01/02/2022 1045   MCHC 32.3 01/02/2022 1045   RDW 13.4 01/02/2022 1045   LYMPHSABS 1,367 01/02/2022 1045   MONOABS 0.6 03/08/2021 1007   EOSABS 123 01/02/2022 1045   BASOSABS 29 01/02/2022 1045    CMP     Component Value Date/Time   NA 143 01/02/2022 1045   K 3.9 01/02/2022 1045   CL 108 01/02/2022 1045   CO2 27 01/02/2022 1045   GLUCOSE 64 (L) 01/02/2022 1045   BUN 15 01/02/2022 1045   CREATININE 0.56 01/02/2022 1045   CALCIUM 9.7 01/02/2022 1045   PROT 6.6 01/02/2022 1045   ALBUMIN 3.9 03/08/2021 1007   AST 17 01/02/2022 1045   ALT 6 01/02/2022 1045   ALKPHOS 66 03/08/2021 1007   BILITOT 0.3 01/02/2022 1045   GFRNONAA 120.08 04/27/2010 1340     Baseline Immunosuppressant Therapy Labs     Latest Ref Rng & Units 10/09/2021   11:11 AM  Quantiferon TB Gold  Quantiferon TB Gold Plus NEGATIVE NEGATIVE        Latest Ref Rng & Units 10/09/2021   11:11 AM  Hepatitis  Hep B Surface Ag NON-REACTIVE NON-REACTIVE   Hep B IgM NON-REACTIVE NON-REACTIVE   Hep C Ab NON-REACTIVE NON-REACTIVE     Lab Results  Component Value Date   HIV NON-REACTIVE 10/09/2021   HIV NON-REACTIVE 06/29/2018       Latest Ref Rng & Units 10/09/2021   11:11 AM  Immunoglobulin Electrophoresis  IgA  70 - 320 mg/dL 531   IgG 600 - 1,540 mg/dL 1,594   IgM 50 - 300 mg/dL 416        Latest Ref Rng & Units 01/02/2022   10:45 AM  Serum Protein Electrophoresis  Total Protein 6.1 - 8.1 g/dL 6.6     No results found for:  "G6PDH"  No results found for: "TPMT"   Lipid Panel Lab Results  Component Value Date   CHOL 212 (H) 08/16/2020   HDL 66.00 08/16/2020   LDLCALC 124 (H) 08/16/2020   TRIG 107.0 08/16/2020   CHOLHDL 3 08/16/2020     Chest x-ray: 10/12/21 - No active cardiopulmonary disease.  Assessment/Plan:  Counseled patient that Actemra is an IL-6 blocking agent.  Counseled patient on purpose, proper use, and adverse effects of Actemra.  Reviewed the most common adverse effects including infections, infusion reactions, bowel injury, and rarely cancer and conditions of the nervous system.  Reviewed risk of lipid abnormalities and elevated liver enzymes. Discussed that there is the possibility of an increased risk of malignancy but it is not well understood if this increased risk is due to the medication or the disease state. Counseled patient that Actemra should be held prior to scheduled surgery for infections or new antibiotic use. Counseled patient that Actemra should be held prior to scheduled surgery. Recommend annual influenza, PCV 15 or PCV20 or Pneumovax 23, and Shingrix as indicated.  Reviewed the importance of regular labs while on Actemra therapy including the need  for routine lipid panel.  Will recheck lipid panel after 4-8 weeks of starting Actemra, then every 6 months routinely. CBC and CMP will be monitored every 3 months. TB gold will be monitored annually. Standing orders placed.  Provided patient with medication education material and answered all questions.  Patient voiced understanding.  Patient consented to Actemra.  Will upload consent into the media tab.  Reviewed storage instructions of Actemra with patient.  Advised patient that initial Actemra injection must be given in the office.  Will apply for Actemra through patient's insurance for self-administration using auto-injector/prefilled syringe.  Patient dose will be  162 mg every 14 days based on weight <100 kg .  Prescription pending  lab results and/or insurance approval.   Orders Placed This Encounter  Procedures   COMPLETE METABOLIC PANEL WITH GFR   CBC with Differential/Platelet   Lipid panel   Knox Saliva, PharmD, MPH, BCPS, CPP Clinical Pharmacist (Rheumatology and Pulmonology)

## 2022-03-07 ENCOUNTER — Other Ambulatory Visit (HOSPITAL_COMMUNITY): Payer: Self-pay

## 2022-03-07 ENCOUNTER — Other Ambulatory Visit: Payer: Self-pay | Admitting: Rheumatology

## 2022-03-07 LAB — COMPLETE METABOLIC PANEL WITH GFR
AG Ratio: 1.1 (calc) (ref 1.0–2.5)
ALT: 6 U/L (ref 6–29)
AST: 17 U/L (ref 10–35)
Albumin: 3.6 g/dL (ref 3.6–5.1)
Alkaline phosphatase (APISO): 66 U/L (ref 37–153)
BUN/Creatinine Ratio: 21 (calc) (ref 6–22)
BUN: 10 mg/dL (ref 7–25)
CO2: 28 mmol/L (ref 20–32)
Calcium: 9.4 mg/dL (ref 8.6–10.4)
Chloride: 103 mmol/L (ref 98–110)
Creat: 0.48 mg/dL — ABNORMAL LOW (ref 0.50–1.05)
Globulin: 3.4 g/dL (calc) (ref 1.9–3.7)
Glucose, Bld: 80 mg/dL (ref 65–99)
Potassium: 4.1 mmol/L (ref 3.5–5.3)
Sodium: 138 mmol/L (ref 135–146)
Total Bilirubin: 0.6 mg/dL (ref 0.2–1.2)
Total Protein: 7 g/dL (ref 6.1–8.1)
eGFR: 105 mL/min/{1.73_m2} (ref >=60)

## 2022-03-07 LAB — CBC WITH DIFFERENTIAL/PLATELET
Absolute Monocytes: 625 cells/uL (ref 200–950)
Basophils Absolute: 80 cells/uL (ref 0–200)
Basophils Relative: 1.6 %
Eosinophils Absolute: 160 cells/uL (ref 15–500)
Eosinophils Relative: 3.2 %
HCT: 33.1 % — ABNORMAL LOW (ref 35.0–45.0)
Hemoglobin: 10.7 g/dL — ABNORMAL LOW (ref 11.7–15.5)
Lymphs Abs: 1540 cells/uL (ref 850–3900)
MCH: 26.8 pg — ABNORMAL LOW (ref 27.0–33.0)
MCHC: 32.3 g/dL (ref 32.0–36.0)
MCV: 83 fL (ref 80.0–100.0)
MPV: 10.4 fL (ref 7.5–12.5)
Monocytes Relative: 12.5 %
Neutro Abs: 2595 cells/uL (ref 1500–7800)
Neutrophils Relative %: 51.9 %
Platelets: 347 10*3/uL (ref 140–400)
RBC: 3.99 10*6/uL (ref 3.80–5.10)
RDW: 13.1 % (ref 11.0–15.0)
Total Lymphocyte: 30.8 %
WBC: 5 10*3/uL (ref 3.8–10.8)

## 2022-03-07 LAB — LIPID PANEL
Cholesterol: 172 mg/dL (ref <200)
HDL: 42 mg/dL — ABNORMAL LOW (ref >=50)
LDL Cholesterol (Calc): 113 mg/dL (calc) — ABNORMAL HIGH
Non-HDL Cholesterol (Calc): 130 mg/dL (calc) — ABNORMAL HIGH (ref <130)
Total CHOL/HDL Ratio: 4.1 (calc) (ref <5.0)
Triglycerides: 80 mg/dL (ref <150)

## 2022-03-07 NOTE — Telephone Encounter (Signed)
Submitted a Prior Authorization request to [facility] for ACTEMRA via CoverMyMeds. Will update once we receive a response.  Key: [id number]  Knox Saliva, PharmD, MPH, BCPS, CPP Clinical Pharmacist (Rheumatology and Pulmonology)

## 2022-03-07 NOTE — Progress Notes (Signed)
LDL is slightly elevated-113.  HDL is low-42.  Please notify the patient and forward results to PCP.   Lipid panel will be rechecked 8-12 weeks after switching to actemra.

## 2022-03-08 ENCOUNTER — Other Ambulatory Visit (HOSPITAL_COMMUNITY): Payer: Self-pay

## 2022-03-08 NOTE — Telephone Encounter (Signed)
Received notification from [facility] regarding a prior authorization for [place]. Authorization has been APPROVED from 09/05/22 to 09/05/22.   Per test claim, copay for 28 days supply is $0  Patient can fill through [place]: [phone]   Authorization #  [id number]  Knox Saliva, PharmD, MPH, BCPS, CPP Clinical Pharmacist (Rheumatology and Pulmonology)

## 2022-03-11 NOTE — Telephone Encounter (Signed)
Patient scheduled for Actemra new start appt on 03/14/22. Advised patient not to take Orencia on Thursday  Knox Saliva, PharmD, MPH, BCPS, CPP Clinical Pharmacist (Rheumatology and Pulmonology)

## 2022-03-13 ENCOUNTER — Other Ambulatory Visit (HOSPITAL_COMMUNITY): Payer: Self-pay

## 2022-03-13 NOTE — Progress Notes (Unsigned)
Pharmacy Note  Subjective:   Patient presents to clinic today with her daughter Alwyn Ren to receive first dose of Actemra for rheumatoid arthritis. Patient currently takes Orencia '125mg'$  SQ weekly with and leflunomide '20mg'$  daily. Her last dose of Orencia was on 03/07/22.  Patient running a fever or have signs/symptoms of infection? No  Patient currently on antibiotics for the treatment of infection? No  Patient have any upcoming invasive procedures/surgeries? No  Objective: CMP     Component Value Date/Time   NA 138 03/06/2022 1049   K 4.1 03/06/2022 1049   CL 103 03/06/2022 1049   CO2 28 03/06/2022 1049   GLUCOSE 80 03/06/2022 1049   BUN 10 03/06/2022 1049   CREATININE 0.48 (L) 03/06/2022 1049   CALCIUM 9.4 03/06/2022 1049   PROT 7.0 03/06/2022 1049   ALBUMIN 3.9 03/08/2021 1007   AST 17 03/06/2022 1049   ALT 6 03/06/2022 1049   ALKPHOS 66 03/08/2021 1007   BILITOT 0.6 03/06/2022 1049   GFRNONAA 120.08 04/27/2010 1340    CBC    Component Value Date/Time   WBC 5.0 03/06/2022 1049   RBC 3.99 03/06/2022 1049   HGB 10.7 (L) 03/06/2022 1049   HCT 33.1 (L) 03/06/2022 1049   PLT 347 03/06/2022 1049   MCV 83.0 03/06/2022 1049   MCH 26.8 (L) 03/06/2022 1049   MCHC 32.3 03/06/2022 1049   RDW 13.1 03/06/2022 1049   LYMPHSABS 1,540 03/06/2022 1049   MONOABS 0.6 03/08/2021 1007   EOSABS 160 03/06/2022 1049   BASOSABS 80 03/06/2022 1049    Baseline Immunosuppressant Therapy Labs TB GOLD    Latest Ref Rng & Units 10/09/2021   11:11 AM  Quantiferon TB Gold  Quantiferon TB Gold Plus NEGATIVE NEGATIVE    Hepatitis Panel    Latest Ref Rng & Units 10/09/2021   11:11 AM  Hepatitis  Hep B Surface Ag NON-REACTIVE NON-REACTIVE   Hep B IgM NON-REACTIVE NON-REACTIVE   Hep C Ab NON-REACTIVE NON-REACTIVE    HIV Lab Results  Component Value Date   HIV NON-REACTIVE 10/09/2021   HIV NON-REACTIVE 06/29/2018   Immunoglobulins    Latest Ref Rng & Units 10/09/2021   11:11 AM   Immunoglobulin Electrophoresis  IgA  70 - 320 mg/dL 531   IgG 600 - 1,540 mg/dL 1,594   IgM 50 - 300 mg/dL 416    SPEP    Latest Ref Rng & Units 03/06/2022   10:49 AM  Serum Protein Electrophoresis  Total Protein 6.1 - 8.1 g/dL 7.0    G6PD No results found for: "G6PDH" TPMT No results found for: "TPMT"   Chest x-ray: 10/12/21 - No active cardiopulmonary disease  Assessment/Plan:  Reviewed importance of holding Actemra with signs/symptoms of an infections, if antibiotics are prescribed to treat an active infection, and with invasive procedures  Demonstrated proper injection technique with Actemra Actpen autoinjector demo device  Patient able to demonstrate proper injection technique using the teach back method.  Patient self injected in the right upper thigh with:  Sample Medication: Actemra '162mg'$ /0.62m autoinjector pen NDC: [phone]: [id number]: 01/2023  Patient tolerated well.  Observed for 30 mins in office for adverse reaction and none noted.   Patient is to return in 1 month for labs and 6-8 weeks for follow-up appointment.  Standing orders placed.   Actemra approved through insurance .   [phone].  Patient provided with pharmacy phone number and advised to they  will call to schedule shipment to her home. Advised that [place], [facility]PP[facility], will reach out to schedule first shipment.  Patient will continue Actemra '162mg'$  SQ every 14 days in combination with leflunomide '20mg'$  once daily. Prednisone rx resent to correct pharmacy.  All questions encouraged and answered.  Instructed patient to call with any further questions or concerns.  Knox Saliva, PharmD, MPH, BCPS, CPP Clinical Pharmacist (Rheumatology and Pulmonology)  03/13/2022 11:14 AM

## 2022-03-13 NOTE — Patient Instructions (Incomplete)
Your next ACTEMRA dose is due on 03/21/22, 03/28/22, and every 7 days thereafter  CONTINUE leflunomide '20mg'$  daily  HOLD ACTEMRA and leflunomide if you have signs or symptoms of an infection. You can resume once you feel better or back to your baseline. HOLD ACTEMRA and leflunomide if you start antibiotics to treat an infection. HOLD ACTEMRA and leflunomide around the time of surgery/procedures. Your surgeon will be able to provide recommendations on when to hold BEFORE and when you are cleared to [place].  Pharmacy information: Your prescription will be shipped from [facility]. Their phone number is [phone] [place] will reach out to schedule your first shipment. After that, the pharmacy staff will call you directly for refills.  Labs are due in 1 month then every 3 months. Lab hours are from Monday to Thursday 1:30-4:30pm and Friday 1:30-4pm. You do not need an appointment if you come for labs during these times.  How to manage an injection site reaction: Remember the 5 C's: COUNTER - leave on the counter at least 30 minutes but up to overnight to bring medication to room temperature. This may help prevent stinging COLD - place something cold (like an ice gel pack or cold water bottle) on the injection site just before cleansing with alcohol. This may help reduce pain CLARITIN - use Claritin (generic name is loratadine) for the first two weeks of treatment or the day of, the day before, and the day after injecting. This will help to minimize injection site reactions CORTISONE CREAM - apply if injection site is irritated and itching CALL ME - if injection site reaction is bigger than the size of your fist, looks infected, blisters, or if you develop hives

## 2022-03-14 ENCOUNTER — Ambulatory Visit: Payer: Medicare Other | Attending: Rheumatology | Admitting: Pharmacist

## 2022-03-14 ENCOUNTER — Other Ambulatory Visit (HOSPITAL_COMMUNITY): Payer: Self-pay

## 2022-03-14 DIAGNOSIS — Z7189 Other specified counseling: Secondary | ICD-10-CM

## 2022-03-14 DIAGNOSIS — M059 Rheumatoid arthritis with rheumatoid factor, unspecified: Secondary | ICD-10-CM

## 2022-03-14 DIAGNOSIS — Z79899 Other long term (current) drug therapy: Secondary | ICD-10-CM

## 2022-03-14 MED ORDER — ACTEMRA ACTPEN 162 MG/0.9ML ~~LOC~~ SOAJ
162.0000 mg | SUBCUTANEOUS | 2 refills | Status: DC
  Filled 2022-03-14: qty 1.8, fill #0
  Filled 2022-03-25: qty 1.8, 28d supply, fill #0
  Filled 2022-04-15: qty 1.8, 28d supply, fill #1
  Filled 2022-05-13: qty 1.8, 28d supply, fill #2

## 2022-03-14 MED ORDER — PREDNISONE 5 MG PO TABS: ORAL_TABLET | ORAL | 0 refills | Status: DC

## 2022-03-20 ENCOUNTER — Other Ambulatory Visit (HOSPITAL_COMMUNITY): Payer: Self-pay

## 2022-03-25 ENCOUNTER — Other Ambulatory Visit (HOSPITAL_COMMUNITY): Payer: Self-pay

## 2022-03-26 ENCOUNTER — Other Ambulatory Visit (HOSPITAL_COMMUNITY): Payer: Self-pay

## 2022-03-26 NOTE — Progress Notes (Signed)
Onboarding task has been completed on 10.2.23. Patient will receive next shipment to home on 10.4.23.

## 2022-04-04 NOTE — Progress Notes (Unsigned)
Office Visit Note  Patient: Melanie Garrett             Date of Birth: [birth year]           MRN: [id number]             PCP: Janith Lima, MD Referring: Janith Lima, MD Visit Date: 04/17/2022 Occupation: '@GUAROCC'$ @  Subjective:  Pain in multiple joints   History of Present Illness: Melanie Garrett is a 66 y.o. female with history of seropositive rheumatoid arthritis and osteoarthritis.  She is taking Actemra 162 mg sq injections every 14 days and Arava 20 mg 1 tablet by mouth daily.  Patient was started on Actemra on 03/14/2022.  She has been tolerating Actemra without any side effects or injection site reactions.  She has had zero improvement since initiating therapy.  She continues to take tramadol for pain relief.  She has ongoing pain and swelling in the left ankle joint.  She states yesterday she started to have some increased discomfort and tenderness in the left shoulder.  She is also having some swelling in her right knee joint.  She states that the previous prednisone taper alleviated her symptoms but since then her discomfort and swelling has returned. She denies any recent or recurrent infections.    Activities of Daily Living:  Patient reports morning stiffness for 0 minutes.   Patient Denies nocturnal pain.  Difficulty dressing/grooming: Denies Difficulty climbing stairs: Denies Difficulty getting out of chair: Denies Difficulty using hands for taps, buttons, cutlery, and/or writing: Reports  Review of Systems  Constitutional:  Negative for fatigue.  HENT:  Negative for mouth sores and mouth dryness.   Eyes:  Negative for dryness.  Respiratory:  Negative for shortness of breath.   Cardiovascular:  Negative for chest pain and palpitations.  Gastrointestinal:  Negative for blood in stool, constipation and diarrhea.  Endocrine: Negative for increased urination.  Genitourinary:  Negative for involuntary urination.  Musculoskeletal:  Positive for joint pain, joint pain  and joint swelling. Negative for gait problem, myalgias, muscle weakness, morning stiffness, muscle tenderness and myalgias.  Skin:  Negative for color change, rash, hair loss and sensitivity to sunlight.  Allergic/Immunologic: Negative for susceptible to infections.  Neurological:  Negative for dizziness and headaches.  Hematological:  Negative for swollen glands.  Psychiatric/Behavioral:  Negative for depressed mood and sleep disturbance. The patient is not nervous/anxious.     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   Intractable migraine without aura and without status migrainosus 08/21/2020   Screening for cervical cancer 08/21/2020   Screen for colon cancer 08/16/2020   Visit for screening mammogram 08/16/2020   Hypokalemia due to inadequate potassium intake 08/16/2020   Osteoarthritis 02/09/2020   Obesity (BMI 30.0-34.9) 06/29/2018   Urticaria, idiopathic 09/02/2016   Routine general medical examination at a health care facility 09/02/2016   Eczema, dyshidrotic 02/02/2015    Past Medical History:  Diagnosis Date   Osteoarthritis 02/09/2020   RA (rheumatoid arthritis) ([place]) 04/05/2021    Family History  Problem Relation Age of Onset   Arthritis Mother    Lupus Sister    Lupus Brother    Seizures Brother    Hypertension Daughter    Arthritis Daughter    Hypertension Daughter    Arthritis Daughter  Alcohol abuse Neg Hx    Cancer Neg Hx    Diabetes Neg Hx    Drug abuse Neg Hx    Early death Neg Hx    Heart disease Neg Hx    Stroke Neg Hx    Past Surgical History:  Procedure Laterality Date   KNEE SURGERY Left    acl repair   Social History   Social History Narrative   Not on file   Immunization History  Administered Date(s) Administered   Influenza,inj,Quad PF,6+ Mos 06/29/2018,  03/08/2021   Influenza-Unspecified 02/23/2015, 03/24/2016, 03/24/2017   PNEUMOCOCCAL CONJUGATE-20 07/12/2021   Td 04/27/2010   Tdap 05/04/2013     Objective: Vital Signs: BP (!) 155/91 (BP Location: Left Arm, Patient Position: Sitting, Cuff Size: Normal)   Pulse 76   Resp 16   Ht '5\' 2"'$  (1.575 m)   Wt 147 lb 3.2 oz (66.8 kg)   BMI 26.92 kg/m    Physical Exam Vitals and nursing note reviewed.  Constitutional:      Appearance: She is well-developed.  HENT:     Head: Normocephalic and atraumatic.  Eyes:     Conjunctiva/sclera: Conjunctivae normal.  Cardiovascular:     Rate and Rhythm: Normal rate and regular rhythm.     Heart sounds: Normal heart sounds.  Pulmonary:     Effort: Pulmonary effort is normal.     Breath sounds: Normal breath sounds.  Abdominal:     General: Bowel sounds are normal.     Palpations: Abdomen is soft.  Musculoskeletal:     Cervical back: Normal range of motion.  Skin:    General: Skin is warm and dry.     Capillary Refill: Capillary refill takes less than 2 seconds.  Neurological:     Mental Status: She is alert and oriented to person, place, and time.  Psychiatric:        Behavior: Behavior normal.      Musculoskeletal Exam: C-spine has limited range of motion with lateral rotation.  Sternoclavicular joint thickening bilaterally.  Painful range of motion of the left shoulder with some tenderness upon palpation.  Elbow joints have good range of motion with no tenderness or synovitis.  Synovial thickening of all MCP joints.  Synovitis of the right fourth MCP joint.  Tenderness and inflammation of the left fourth PIP joint.  Tenderness over bilateral first MCP joints.  PIP and DIP thickening consistent with osteoarthritis of both hands.  Hip joints have good range of motion with no groin pain.  Some warmth and swelling of the right knee noted.  Tenderness and synovitis of the left ankle joint.  CDAI Exam: CDAI Score: 10.4  Patient Global: 8 mm;  Provider Global: 6 mm Swollen: 4 ; Tender: 7  Joint Exam 04/17/2022      Right  Left  Glenohumeral      Tender  MCP 1   Tender   Tender  MCP 4  Swollen Tender     PIP 4     Swollen Tender  Knee  Swollen Tender     Ankle     Swollen Tender     Investigation: No additional findings.  Imaging: No results found.  Recent Labs: Lab Results  Component Value Date   WBC 5.0 03/06/2022   HGB 10.7 (L) 03/06/2022   PLT 347 03/06/2022   NA 138 03/06/2022   K 4.1 03/06/2022   CL 103 03/06/2022   CO2 28 03/06/2022   GLUCOSE 80 03/06/2022  BUN 10 03/06/2022   CREATININE 0.48 (L) 03/06/2022   BILITOT 0.6 03/06/2022   ALKPHOS 66 03/08/2021   AST 17 03/06/2022   ALT 6 03/06/2022   PROT 7.0 03/06/2022   ALBUMIN 3.9 03/08/2021   CALCIUM 9.4 03/06/2022   QFTBGOLDPLUS NEGATIVE 10/09/2021    Speciality Comments: dxd 08/21 [facility] rhe[facility] MTX, Arava-inadequate response.  Intermittent use of Orencia due to positive TB Gold, (stopped on 03/07/22), Actemra started 03/14/22  rifampin 08/2022X 4 months  Procedures:  No procedures performed Allergies: Patient has no known allergies.   Assessment / Plan:     Visit Diagnoses: Seropositive rheumatoid arthritis ([place]) - +RF,+CCP Ab - positive ANA, elevated sedimentation rate, severe synovitis.  DXd 08/21 at [place] rhe[facility]: Patient continues to have ongoing joint pain and inflammation involving multiple joints.  Her pain is most severe in the left ankle joint especially with ambulation.  On examination she has ongoing synovitis in multiple joints as described above.  She is currently on Actemra 162 mg subcutaneous injections once every 14 days and Arava 20 mg 1 tablet by mouth daily.  She initiated Actemra on 03/14/2022 and has had a total of 3 doses so far.  She has noticed 0% improvement in her joint pain and inflammation since initiating therapy.  A prednisone taper was sent to the pharmacy on 03/14/2022 which provided temporary relief but her symptoms have  returned.  She requested a another prednisone taper to alleviate her current flare.  She is willing to give combination therapy more time and for [place] to reassess for the full efficacy in 2 months.- Plan: predniSONE (DELTASONE) 5 MG tablet  High risk medication use - Actemra 162 mg sq injections every 14 days, Arava 20 mg 1 tablet by mouth daily. - Plan: CBC with Differential/Platelet, COMPLETE METABOLIC PANEL WITH GFR Initiated Actemra on 03/14/2022. Previous therapy includes methotrexate, Arava, Orencia. CBC and CMP updated on 03/06/22.  Orders for CBC and CMP were released today.  Her next lab work will be due in January and every 3 months to monitor for drug toxicity.  Standing orders for CBC and CMP remain in place. TB gold negative on 10/09/21.  Lipid panel drawn on 03/06/22.  She has not had any recent or recurrent infections.  Discussed the importance of holding actemra and arava if she develops signs or symptoms of an infection and to resume once the infection has completely cleared.   TB lung, latent - CXR on 10/12/21 no active cardiopulmonary disease. TB gold negative on 10/09/21.   Primary osteoarthritis of both hands: PIP and DIP thickening consistent with osteoarthritis of both hands.  She continues to have tenderness and synovitis of several joints due to active rheumatoid arthritis.  Primary osteoarthritis of both knees: Warmth and swelling of the right knee joint noted on examination today.  Primary osteoarthritis of both feet: She continues to have chronic left ankle joint pain and inflammation.  She has noticed 0% improvement since initiating Actemra.  A prednisone taper was sent to the pharmacy today to alleviate her symptoms.  Pain in left ankle and joints of left foot: Ongoing tenderness and synovitis of the left ankle joint.  She has difficulty with ambulation due to severity of pain.  Prednisone taper was sent to the pharmacy today  Other medical conditions are listed as  follows:  Urticaria, idiopathic  Intractable migraine without aura and without status migrainosus  Eczema, dyshidrotic  Family history of systemic lupus erythematosus, mother and brother  Orders: Orders Placed  This Encounter  Procedures   CBC with Differential/Platelet   COMPLETE METABOLIC PANEL WITH GFR   Meds ordered this encounter  Medications   predniSONE (DELTASONE) 5 MG tablet    Sig: Take 4 tablets by mouth daily x4 days, 3 tablets daily x4 days, 2 tablets daily x4 days, 1 tablet daily x4 days.    Dispense:  40 tablet    Refill:  0     Follow-Up Instructions: Return in about 2 months (around 06/17/2022) for Rheumatoid arthritis, Osteoarthritis.   Ofilia Neas, PA-C  Note - This record has been created using Dragon software.  Chart creation errors have been sought, but may not always  have been located. Such creation errors do not reflect on  the standard of medical care.

## 2022-04-06 ENCOUNTER — Other Ambulatory Visit: Payer: Self-pay | Admitting: Physician Assistant

## 2022-04-15 ENCOUNTER — Other Ambulatory Visit (HOSPITAL_COMMUNITY): Payer: Self-pay

## 2022-04-17 ENCOUNTER — Ambulatory Visit: Payer: Medicare Other | Attending: Physician Assistant | Admitting: Physician Assistant

## 2022-04-17 ENCOUNTER — Telehealth: Payer: Self-pay | Admitting: *Deleted

## 2022-04-17 ENCOUNTER — Encounter: Payer: Self-pay | Admitting: Physician Assistant

## 2022-04-17 VITALS — BP 155/91 | HR 76 | Resp 16 | Ht 62.0 in | Wt 147.2 lb

## 2022-04-17 DIAGNOSIS — M19042 Primary osteoarthritis, left hand: Secondary | ICD-10-CM

## 2022-04-17 DIAGNOSIS — M19041 Primary osteoarthritis, right hand: Secondary | ICD-10-CM | POA: Diagnosis not present

## 2022-04-17 DIAGNOSIS — M19072 Primary osteoarthritis, left ankle and foot: Secondary | ICD-10-CM

## 2022-04-17 DIAGNOSIS — Z8269 Family history of other diseases of the musculoskeletal system and connective tissue: Secondary | ICD-10-CM | POA: Diagnosis not present

## 2022-04-17 DIAGNOSIS — L301 Dyshidrosis [pompholyx]: Secondary | ICD-10-CM | POA: Diagnosis not present

## 2022-04-17 DIAGNOSIS — Z79899 Other long term (current) drug therapy: Secondary | ICD-10-CM

## 2022-04-17 DIAGNOSIS — M059 Rheumatoid arthritis with rheumatoid factor, unspecified: Secondary | ICD-10-CM

## 2022-04-17 DIAGNOSIS — M170 Bilateral primary osteoarthritis of knee: Secondary | ICD-10-CM

## 2022-04-17 DIAGNOSIS — Z227 Latent tuberculosis: Secondary | ICD-10-CM | POA: Diagnosis not present

## 2022-04-17 DIAGNOSIS — L501 Idiopathic urticaria: Secondary | ICD-10-CM

## 2022-04-17 DIAGNOSIS — M25572 Pain in left ankle and joints of left foot: Secondary | ICD-10-CM

## 2022-04-17 DIAGNOSIS — G43019 Migraine without aura, intractable, without status migrainosus: Secondary | ICD-10-CM

## 2022-04-17 DIAGNOSIS — M19071 Primary osteoarthritis, right ankle and foot: Secondary | ICD-10-CM | POA: Diagnosis not present

## 2022-04-17 MED ORDER — PREDNISONE 5 MG PO TABS: ORAL_TABLET | ORAL | 0 refills | Status: DC

## 2022-04-17 NOTE — Patient Instructions (Signed)
Standing Labs We placed an order today for your standing lab work.   Please have your standing labs drawn in January and every 3 months    Please have your labs drawn 2 weeks prior to your appointment so that the provider can discuss your lab results at your appointment.  Please note that you may see your imaging and lab results in MyChart before we have reviewed them. We will contact you once all results are reviewed. Please allow our office up to 72 hours to thoroughly review all of the results before contacting the office for clarification of your results.  Lab hours are: Monday through Thursday from 1:30 pm-4:30 pm and Friday from 1:30 pm- 4:00 pm  You may experience shorter wait times on Monday, Thursday or Friday afternoons,.   Effective April 22, 2022, new lab hours will be: Monday through Thursday from 8:00 am -12:30 pm and 1:00 pm-5:00 pm and Friday from 8:00 am-12:00 pm.  Please be advised, all patients with office appointments requiring lab work will take precedent over walk-in lab work.   Labs are drawn by [facility]. Please bring your co-pay at the time of your lab draw.  You may receive a bill from [facility] for your lab work.  Please note if you are on Hydroxychloroquine and and an order has been placed for a Hydroxychloroquine level, you will need to have it drawn 4 hours or more after your last dose.  If you wish to have your labs drawn at another location, please call the office 24 hours in advance so we can fax the orders.  The office is located at [address], [address], [place], [place] [address] No appointment is necessary.    If you have any questions regarding directions or hours of operation,  please call [phone].   As a reminder, please drink plenty of water prior to coming for your lab work. Thanks!\ If you have signs or symptoms of an infection or start antibiotics: First, call your PCP for workup of your infection. Hold your medication through the  infection, until you complete your antibiotics, and until symptoms resolve if you take the following: Injectable medication (Actemra, Benlysta, Cimzia, Cosentyx, Enbrel, Humira, Kevzara, Orencia, Remicade, Simponi, Stelara, Taltz, Tremfya) Methotrexate Leflunomide (Arava) Mycophenolate (Cellcept) Xeljanz, Olumiant, or Rinvoq   Vaccines You are taking a medication(s) that can suppress your immune system.  The following immunizations are recommended: Flu annually Covid-19  Td/Tdap (tetanus, diphtheria, pertussis) every 10 years Pneumonia (Prevnar 15 then Pneumovax 23 at least 1 year apart.  Alternatively, can take Prevnar 20 without needing additional dose) Shingrix: 2 doses from 4 weeks to 6 months apart  Please check with your PCP to make sure you are up to date.   

## 2022-04-17 NOTE — Progress Notes (Signed)
White blood cell count is low: 3.0.  Absolute neutrophil count is borderline low.  Hemoglobin is borderline low: 11.5.  Recommend updating CBC with differential in 1 month.

## 2022-04-17 NOTE — Telephone Encounter (Signed)
-----   Message from Ofilia Neas, PA-C sent at 04/17/2022  4:12 PM EDT ----- White blood cell count is low: 3.0.  Absolute neutrophil count is borderline low.  Hemoglobin is borderline low: 11.5.  Recommend updating CBC with differential in 1 month.

## 2022-04-18 ENCOUNTER — Other Ambulatory Visit (HOSPITAL_COMMUNITY): Payer: Self-pay

## 2022-04-18 LAB — CBC WITH DIFFERENTIAL/PLATELET
Absolute Monocytes: 516 cells/uL (ref 200–950)
Basophils Absolute: 51 cells/uL (ref 0–200)
Basophils Relative: 1.7 %
Eosinophils Absolute: 159 cells/uL (ref 15–500)
Eosinophils Relative: 5.3 %
HCT: 35.3 % (ref 35.0–45.0)
Hemoglobin: 11.5 g/dL — ABNORMAL LOW (ref 11.7–15.5)
Lymphs Abs: 1341 cells/uL (ref 850–3900)
MCH: 27.8 pg (ref 27.0–33.0)
MCHC: 32.6 g/dL (ref 32.0–36.0)
MCV: 85.3 fL (ref 80.0–100.0)
MPV: 11.5 fL (ref 7.5–12.5)
Monocytes Relative: 17.2 %
Neutro Abs: 933 cells/uL — ABNORMAL LOW (ref 1500–7800)
Neutrophils Relative %: 31.1 %
Platelets: 191 10*3/uL (ref 140–400)
RBC: 4.14 10*6/uL (ref 3.80–5.10)
RDW: 14.7 % (ref 11.0–15.0)
Total Lymphocyte: 44.7 %
WBC: 3 10*3/uL — ABNORMAL LOW (ref 3.8–10.8)

## 2022-04-18 LAB — COMPLETE METABOLIC PANEL WITH GFR
AG Ratio: 1.4 (calc) (ref 1.0–2.5)
ALT: 24 U/L (ref 6–29)
AST: 30 U/L (ref 10–35)
Albumin: 3.8 g/dL (ref 3.6–5.1)
Alkaline phosphatase (APISO): 73 U/L (ref 37–153)
BUN: 13 mg/dL (ref 7–25)
CO2: 28 mmol/L (ref 20–32)
Calcium: 9.6 mg/dL (ref 8.6–10.4)
Chloride: 108 mmol/L (ref 98–110)
Creat: 0.66 mg/dL (ref 0.50–1.05)
Globulin: 2.7 g/dL (calc) (ref 1.9–3.7)
Glucose, Bld: 70 mg/dL (ref 65–99)
Potassium: 3.8 mmol/L (ref 3.5–5.3)
Sodium: 142 mmol/L (ref 135–146)
Total Bilirubin: 0.4 mg/dL (ref 0.2–1.2)
Total Protein: 6.5 g/dL (ref 6.1–8.1)
eGFR: 97 mL/min/{1.73_m2} (ref >=60)

## 2022-04-18 NOTE — Progress Notes (Signed)
CMP WNL

## 2022-05-08 ENCOUNTER — Other Ambulatory Visit: Payer: Self-pay | Admitting: Physician Assistant

## 2022-05-08 NOTE — Telephone Encounter (Signed)
Next Visit: 06/20/2022  Last Visit: 04/17/2022  Last Fill: 02/04/2022  DX: Seropositive rheumatoid arthritis   Current Dose per office note 04/17/2022: Arava 20 mg 1 tablet by mouth daily   Labs: 04/17/2022 White blood cell count is low: 3.0.  Absolute neutrophil count is borderline low.  Hemoglobin is borderline low: 11.5. CMP WNL   Okay to refill Arava?

## 2022-05-09 ENCOUNTER — Other Ambulatory Visit (HOSPITAL_COMMUNITY): Payer: Self-pay

## 2022-05-13 ENCOUNTER — Other Ambulatory Visit (HOSPITAL_COMMUNITY): Payer: Self-pay

## 2022-05-20 ENCOUNTER — Other Ambulatory Visit (HOSPITAL_COMMUNITY): Payer: Self-pay

## 2022-06-05 ENCOUNTER — Other Ambulatory Visit: Payer: Self-pay | Admitting: Rheumatology

## 2022-06-05 ENCOUNTER — Other Ambulatory Visit (HOSPITAL_COMMUNITY): Payer: Self-pay

## 2022-06-05 ENCOUNTER — Other Ambulatory Visit: Payer: Self-pay

## 2022-06-05 DIAGNOSIS — M059 Rheumatoid arthritis with rheumatoid factor, unspecified: Secondary | ICD-10-CM

## 2022-06-05 DIAGNOSIS — Z79899 Other long term (current) drug therapy: Secondary | ICD-10-CM

## 2022-06-05 MED ORDER — ACTEMRA ACTPEN 162 MG/0.9ML ~~LOC~~ SOAJ
162.0000 mg | SUBCUTANEOUS | 2 refills | Status: DC
  Filled 2022-06-05: qty 1.8, 28d supply, fill #0
  Filled 2022-07-10: qty 1.8, 28d supply, fill #1
  Filled 2022-08-07: qty 1.8, 28d supply, fill #2

## 2022-06-05 NOTE — Telephone Encounter (Signed)
Next Visit: 06/20/2022  Last Visit: 04/17/2022  Last Fill: 03/14/2022  IR:[id number] rheumatoid arthritis   Current Dose per office note 04/17/2022: Actemra 162 mg sq injections every 14 days   Labs: 04/17/2022 CMP WNL White blood cell count is low: 3.0.  Absolute neutrophil count is borderline low.  Hemoglobin is borderline low: 11.5.   CXR on 10/12/21 no active cardiopulmonary disease. TB gold negative on 10/09/21.   Okay to refill Actemra?

## 2022-06-06 NOTE — Progress Notes (Deleted)
Office Visit Note  Patient: Melanie Garrett             Date of Birth: 01-05-1956           MRN: [id number]             PCP: Janith Lima, MD Referring: Janith Lima, MD Visit Date: 06/20/2022 Occupation: '@GUAROCC'$ @  Subjective:  No chief complaint on file.   History of Present Illness: Melanie Garrett is a 66 y.o. female ***    DG Chest 2 View CLINICAL DATA:  Immunosuppressive therapy  EXAM: CHEST - 2 VIEW  COMPARISON:  05/26/2020  FINDINGS: The heart size and mediastinal contours are within normal limits. Both lungs are clear. The visualized skeletal structures are unremarkable.  IMPRESSION: No active cardiopulmonary disease.  Electronically Signed   By: Kathreen Devoid M.D.   On: 10/12/2021 13:50    Activities of Daily Living:  Patient reports morning stiffness for *** {minute/hour:19697}.   Patient {ACTIONS;DENIES/REPORTS:21021675::"Denies"} nocturnal pain.  Difficulty dressing/grooming: {ACTIONS;DENIES/REPORTS:21021675::"Denies"} Difficulty climbing stairs: {ACTIONS;DENIES/REPORTS:21021675::"Denies"} Difficulty getting out of chair: {ACTIONS;DENIES/REPORTS:21021675::"Denies"} Difficulty using hands for taps, buttons, cutlery, and/or writing: {ACTIONS;DENIES/REPORTS:21021675::"Denies"}  No Rheumatology ROS completed.   PMFS History:  Patient Active Problem List   Diagnosis Date Noted   Arthritis of first metatarsophalangeal (MTP) joint of right foot 07/06/2021   RA (rheumatoid arthritis) ([place]) 04/05/2021   TB lung, latent 04/05/2021   Intractable migraine without aura and without status migrainosus 08/21/2020   Screening for cervical cancer 08/21/2020   Screen for colon cancer 08/16/2020   Visit for screening mammogram 08/16/2020   Hypokalemia due to inadequate potassium intake 08/16/2020   Osteoarthritis 02/09/2020   Obesity (BMI 30.0-34.9) 06/29/2018   Urticaria, idiopathic 09/02/2016   Routine general medical examination at a health care  facility 09/02/2016   Eczema, dyshidrotic 02/02/2015    Past Medical History:  Diagnosis Date   Osteoarthritis 02/09/2020   RA (rheumatoid arthritis) ([place]) 04/05/2021    Family History  Problem Relation Age of Onset   Arthritis Mother    Lupus Sister    Lupus Brother    Seizures Brother    Hypertension Daughter    Arthritis Daughter    Hypertension Daughter    Arthritis Daughter    Alcohol abuse Neg Hx    Cancer Neg Hx    Diabetes Neg Hx    Drug abuse Neg Hx    Early death Neg Hx    Heart disease Neg Hx    Stroke Neg Hx    Past Surgical History:  Procedure Laterality Date   KNEE SURGERY Left    acl repair   Social History   Social History Narrative   Not on file   Immunization History  Administered Date(s) Administered   Influenza,inj,Quad PF,6+ Mos 06/29/2018, 03/08/2021   Influenza-Unspecified 02/23/2015, 03/24/2016, 03/24/2017   PNEUMOCOCCAL CONJUGATE-20 07/12/2021   Td 04/27/2010   Tdap 05/04/2013     Objective: Vital Signs: There were no vitals taken for this visit.   Physical Exam   Musculoskeletal Exam: ***  CDAI Exam: CDAI Score: -- Patient Global: --; Provider Global: -- Swollen: --; Tender: -- Joint Exam 06/20/2022   No joint exam has been documented for this visit   There is currently no information documented on the homunculus. Go to the Rheumatology activity and complete the homunculus joint exam.  Investigation: No additional findings.  Imaging: No results found.  Recent Labs: Lab Results  Component Value Date   WBC 3.0 (L) 04/17/2022  HGB 11.5 (L) 04/17/2022   PLT 191 04/17/2022   NA 142 04/17/2022   K 3.8 04/17/2022   CL 108 04/17/2022   CO2 28 04/17/2022   GLUCOSE 70 04/17/2022   BUN 13 04/17/2022   CREATININE 0.66 04/17/2022   BILITOT 0.4 04/17/2022   ALKPHOS 66 03/08/2021   AST 30 04/17/2022   ALT 24 04/17/2022   PROT 6.5 04/17/2022   ALBUMIN 3.9 03/08/2021   CALCIUM 9.6 04/17/2022   QFTBGOLDPLUS NEGATIVE  10/09/2021    Speciality Comments: dxd 08/21 [place] rhe[facility] MTX, Arava-inadequate response.  Intermittent use of Orencia due to positive TB Gold, (stopped on 03/07/22), Actemra started 03/14/22  rifampin 08/2022X 4 months  Procedures:  No procedures performed Allergies: Patient has no known allergies.   Assessment / Plan:     Visit Diagnoses: No diagnosis found.  Orders: No orders of the defined types were placed in this encounter.  No orders of the defined types were placed in this encounter.   Face-to-face time spent with patient was *** minutes. Greater than 50% of time was spent in counseling and coordination of care.  Follow-Up Instructions: No follow-ups on file.   Earnestine Mealing, CMA  Note - This record has been created using Editor, commissioning.  Chart creation errors have been sought, but may not always  have been located. Such creation errors do not reflect on  the standard of medical care.

## 2022-06-07 ENCOUNTER — Other Ambulatory Visit: Payer: Self-pay

## 2022-06-12 ENCOUNTER — Other Ambulatory Visit: Payer: Self-pay

## 2022-06-20 ENCOUNTER — Ambulatory Visit: Payer: Medicare Other | Admitting: Physician Assistant

## 2022-06-20 DIAGNOSIS — Z227 Latent tuberculosis: Secondary | ICD-10-CM

## 2022-06-20 DIAGNOSIS — M19042 Primary osteoarthritis, left hand: Secondary | ICD-10-CM

## 2022-06-20 DIAGNOSIS — M170 Bilateral primary osteoarthritis of knee: Secondary | ICD-10-CM

## 2022-06-20 DIAGNOSIS — M19071 Primary osteoarthritis, right ankle and foot: Secondary | ICD-10-CM

## 2022-06-20 DIAGNOSIS — L301 Dyshidrosis [pompholyx]: Secondary | ICD-10-CM

## 2022-06-20 DIAGNOSIS — M25572 Pain in left ankle and joints of left foot: Secondary | ICD-10-CM

## 2022-06-20 DIAGNOSIS — M059 Rheumatoid arthritis with rheumatoid factor, unspecified: Secondary | ICD-10-CM

## 2022-06-20 DIAGNOSIS — Z79899 Other long term (current) drug therapy: Secondary | ICD-10-CM

## 2022-06-20 DIAGNOSIS — Z8269 Family history of other diseases of the musculoskeletal system and connective tissue: Secondary | ICD-10-CM

## 2022-06-20 DIAGNOSIS — G43019 Migraine without aura, intractable, without status migrainosus: Secondary | ICD-10-CM

## 2022-06-20 DIAGNOSIS — L501 Idiopathic urticaria: Secondary | ICD-10-CM

## 2022-07-08 ENCOUNTER — Other Ambulatory Visit (HOSPITAL_COMMUNITY): Payer: Self-pay

## 2022-07-08 ENCOUNTER — Other Ambulatory Visit: Payer: Self-pay

## 2022-07-10 ENCOUNTER — Other Ambulatory Visit (HOSPITAL_COMMUNITY): Payer: Self-pay

## 2022-07-11 ENCOUNTER — Other Ambulatory Visit (HOSPITAL_COMMUNITY): Payer: Self-pay

## 2022-07-30 NOTE — Progress Notes (Signed)
Office Visit Note  Patient: Melanie Garrett             Date of Birth: [birth year]           MRN: [id number]             PCP: Melanie Lima, MD Referring: Melanie Lima, MD Visit Date: 08/13/2022 Occupation: @GUAROCC$ @  Subjective:  Medication management  History of Present Illness: Melanie Garrett is a 67 y.o. female history of seropositive rheumatoid arthritis and osteoarthritis.  She returns today after her last visit in October 2023.  She states her symptoms have improved over time.  She has been having some discomfort in her right shoulder and occasional discomfort in her hands.  She states her right knee joint is better.  She still continues to have some intermittent swelling in her left ankle joint.  She has been taking Actemra 162 mg subcu every 14 days and leflunomide 20 mg p.o. daily without interruption.  She denies any side effects from the medications.  Denies  new rashes.  Patient states that she took the last dose of prednisone about 2 months ago.    Activities of Daily Living:  Patient reports morning stiffness for 0 minute.   Patient Reports nocturnal pain.  Difficulty dressing/grooming: Denies Difficulty climbing stairs: Denies Difficulty getting out of chair: Reports Difficulty using hands for taps, buttons, cutlery, and/or writing: Reports  Review of Systems  Constitutional: Negative.  Negative for fatigue.  HENT: Negative.  Negative for mouth sores and mouth dryness.   Eyes: Negative.  Negative for dryness.  Respiratory: Negative.  Negative for shortness of breath.   Cardiovascular: Negative.  Negative for chest pain and palpitations.  Gastrointestinal: Negative.  Negative for blood in stool, constipation and diarrhea.  Endocrine: Negative.  Negative for increased urination.  Genitourinary: Negative.  Negative for involuntary urination.  Musculoskeletal:  Positive for joint pain, joint pain and joint swelling. Negative for gait problem, myalgias, muscle  weakness, morning stiffness, muscle tenderness and myalgias.  Skin: Negative.  Negative for color change, rash, hair loss and sensitivity to sunlight.  Allergic/Immunologic: Negative.  Negative for susceptible to infections.  Neurological: Negative.  Negative for dizziness and headaches.  Hematological: Negative.  Negative for swollen glands.  Psychiatric/Behavioral: Negative.  Negative for depressed mood and sleep disturbance. The patient is not nervous/anxious.     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   Intractable migraine without aura and without status migrainosus 08/21/2020   Screening for cervical cancer 08/21/2020   Screen for colon cancer 08/16/2020   Visit for screening mammogram 08/16/2020   Hypokalemia due to inadequate potassium intake 08/16/2020   Osteoarthritis 02/09/2020   Obesity (BMI 30.0-34.9) 06/29/2018   Urticaria, idiopathic 09/02/2016   Routine general medical examination at a health care facility 09/02/2016   Eczema, dyshidrotic 02/02/2015    Past Medical History:  Diagnosis Date   Osteoarthritis 02/09/2020   RA (rheumatoid arthritis) ([place]) 04/05/2021    Family History  Problem Relation Age of Onset   Arthritis Mother    Lupus Sister    Lupus Brother    Seizures Brother    Hypertension Daughter    Arthritis Daughter    Hypertension Daughter    Arthritis Daughter    Alcohol abuse Neg Hx    Cancer Neg  Hx    Diabetes Neg Hx    Drug abuse Neg Hx    Early death Neg Hx    Heart disease Neg Hx    Stroke Neg Hx    Past Surgical History:  Procedure Laterality Date   KNEE SURGERY Left    acl repair   Social History   Social History Narrative   Not on file   Immunization History  Administered Date(s) Administered   Influenza,inj,Quad PF,6+ Mos 06/29/2018, 03/08/2021    Influenza-Unspecified 02/23/2015, 03/24/2016, 03/24/2017   PNEUMOCOCCAL CONJUGATE-20 07/12/2021   Td 04/27/2010   Tdap 05/04/2013     Objective: Vital Signs: BP (!) 165/92 (BP Location: Left Arm, Patient Position: Sitting, Cuff Size: Normal)   Pulse 72   Resp 16   Ht 5' 2"$  (1.575 m)   Wt 136 lb 12.8 oz (62.1 kg)   BMI 25.02 kg/m    Physical Exam Vitals and nursing note reviewed.  Constitutional:      Appearance: She is well-developed.  HENT:     Head: Normocephalic and atraumatic.  Eyes:     Conjunctiva/sclera: Conjunctivae normal.  Cardiovascular:     Rate and Rhythm: Normal rate and regular rhythm.     Heart sounds: Normal heart sounds.  Pulmonary:     Effort: Pulmonary effort is normal.     Breath sounds: Normal breath sounds.  Abdominal:     General: Bowel sounds are normal.     Palpations: Abdomen is soft.  Musculoskeletal:     Cervical back: Normal range of motion.  Lymphadenopathy:     Cervical: No cervical adenopathy.  Skin:    General: Skin is warm and dry.     Capillary Refill: Capillary refill takes less than 2 seconds.  Neurological:     Mental Status: She is alert and oriented to person, place, and time.  Psychiatric:        Behavior: Behavior normal.      Musculoskeletal Exam: She had limited lateral rotation of the cervical spine without discomfort.  She had thickening of the sternoclavicular joints bilaterally.  Shoulder joints were in good range of motion with some discomfort with range of motion of her right shoulder.  Elbow joints were in good range of motion.  She had limited extension and flexion of her bilateral wrist joints.  Synovitis was noted over several right wrist joint, several of her MCPs and PIPs as described below.  She had warmth on palpation of her right knee joint.  She has swelling over her left ankle joint.Marland Kitchen  CDAI Exam: CDAI Score: 20.2  Patient Global: 5 mm; Provider Global: 7 mm Swollen: 10 ; Tender: 11  Joint Exam  08/13/2022      Right  Left  Glenohumeral   Tender     Wrist  Swollen Tender     MCP 1  Swollen Tender     MCP 2  Swollen Tender     MCP 3  Swollen Tender     MCP 4  Swollen Tender     MCP 5  Swollen Tender     PIP 2     Swollen Tender  PIP 4     Swollen Tender  Knee  Swollen Tender     Ankle     Swollen Tender     Investigation: No additional findings.  Imaging: No results found.  Recent Labs: Lab Results  Component Value Date   WBC 3.0 (L) 04/17/2022   HGB 11.5 (L) 04/17/2022  PLT 191 04/17/2022   NA 142 04/17/2022   K 3.8 04/17/2022   CL 108 04/17/2022   CO2 28 04/17/2022   GLUCOSE 70 04/17/2022   BUN 13 04/17/2022   CREATININE 0.66 04/17/2022   BILITOT 0.4 04/17/2022   ALKPHOS 66 03/08/2021   AST 30 04/17/2022   ALT 24 04/17/2022   PROT 6.5 04/17/2022   ALBUMIN 3.9 03/08/2021   CALCIUM 9.6 04/17/2022   QFTBGOLDPLUS NEGATIVE 10/09/2021    Speciality Comments: dxd 08/21 [facility] MTX, Arava-inadequate response.  Intermittent use of Orencia due to positive TB Gold, (stopped on 03/07/22), Actemra started 03/14/22  rifampin 08/2022X 4 months  Procedures:  No procedures performed Allergies: Patient has no known allergies.   Assessment / Plan:     Visit Diagnoses: Seropositive rheumatoid arthritis ([place]) - +RF,+CCP Ab - positive ANA, elevated sedimentation rate, severe synovitis.  DXd 08/21 at [facility]: -Patient continues to have severe inflammatory arthritis involving multiple joints.  Patient states she has been taking Actemra every 14 days and leflunomide 20 mg daily which is confirmed by her daughter who is present today.  She states she has noticed some improvement in her symptoms but distal continues to have a lot of pain and swelling.  Plan: Sedimentation rate..  I did detailed discussion with the patient regarding treatment options that she has not had I had detailed discussion with the patient regarding different treatment options as she has failed Orencia  in the past.  I discussed the option of trying Rinvoq.  Indications side effects contraindications were discussed at length including the black box warning for major adverse cardiovascular events, myocardial infarction, stroke thrombosis, serious infections and lymphoma.  There is no history of heart disease, smoking or diverticulitis.  Will apply for Rinvoq.  Handout was given and consent was taken.  Once Rinvoq is approved we will advise her to stop Actemra.  We can try a combination of leflunomide and Rinvoq in the beginning and then discontinue leflunomide.  Medication counseling:  Baseline Immunosuppressant Therapy Labs TB GOLD    Latest Ref Rng & Units 10/09/2021   11:11 AM  Quantiferon TB Gold  Quantiferon TB Gold Plus NEGATIVE NEGATIVE    Hepatitis Panel    Latest Ref Rng & Units 10/09/2021   11:11 AM  Hepatitis  Hep B Surface Ag NON-REACTIVE NON-REACTIVE   Hep B IgM NON-REACTIVE NON-REACTIVE   Hep C Ab NON-REACTIVE NON-REACTIVE    HIV Lab Results  Component Value Date   HIV NON-REACTIVE 10/09/2021   HIV NON-REACTIVE 06/29/2018   Immunoglobulins    Latest Ref Rng & Units 10/09/2021   11:11 AM  Immunoglobulin Electrophoresis  IgA  70 - 320 mg/dL 531   IgG 600 - 1,540 mg/dL 1,594   IgM 50 - 300 mg/dL 416    SPEP    Latest Ref Rng & Units 04/17/2022   10:06 AM  Serum Protein Electrophoresis  Total Protein 6.1 - 8.1 g/dL 6.5    G6PD No results found for: "G6PDH" TPMT No results found for: "TPMT"   Lab Results  Component Value Date   CHOL 172 03/06/2022   HDL 42 (L) 03/06/2022   LDLCALC 113 (H) 03/06/2022   TRIG 80 03/06/2022   CHOLHDL 4.1 03/06/2022     Chest Xray: October 12, 2021  Does patient have history of diverticulitis?  No  Counseled patient that Rinvoq is a JAK inhibitor indicated for Rheumatoid Arthritis.  Counseled patient on purpose, proper use, and adverse effects of  Rinvoq.    Reviewed the most common adverse effects including infection,  diarrhea, headaches.  Also reviewed rare adverse effects such as bowel injury and the need to contact us if they develop stomach pain during treatment. Counseled on the increase risk of venous thrombosis. Reviewed with patient that there is the possibility of an increased risk of malignancy but it is not well understood if this increased risk is due to the medication or the disease state.  Instructed patient that medication should be held for infection and prior to surgery.  Advised patient to avoid live vaccines. Recommend annual influenza, Pneumovax 23, Prevnar 13, and Shingrix as indicated.    Provided patient with medication education material and answered all questions.  Patient consented to Rinvoq.  Will upload into patient's chart.  Will apply through patient's insurance and update when we receive a response.  Patient dose will be 15 mg daily.  Prescription will be sent to pharmacy pending lab results and insurance approval.  High risk medication use - Actemra 162 mg sq injections every 14 days, Arava 20 mg 1 tablet by mouth daily.  Lipid panel was obtained on March 06, 2022.-Labs obtained on April 17, 2022 showed neutropenia WBC 3.0, anemia with hemoglobin of 11.5.  Platelets were normal.  CMP was normal.   plan: CBC with Differential/Platelet, COMPLETE METABOLIC PANEL WITH GFR today.  Once she starts Rinvoq will have labs in 1 month, then every 3 months.  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  CXR on 10/12/21 no active cardiopulmonary disease. TB gold negative on 10/09/21.  Primary osteoarthritis of both hands-she had bilateral rheumatoid arthritis and osteoarthritis overlap with synovitis involving multiple joints and PIP and DIP thickening.  Primary osteoarthritis of both knees-she complains of pain and discomfort in her knee joints.  Warmth and swelling was noted in her right knee joint.  Primary osteoarthritis  of both feet-she had swelling in her left ankle joint.  She had discomfort in her bilateral feet  Pain in left ankle and joints of left foot-she continues to have swelling in her left ankle joint.  Elevated blood pressure reading-patient blood pressure was elevated at 165/92.  Repeat blood pressure was also elevated.  She was advised to schedule an appointment with her PCP for the treatment of elevated blood pressure.  Urticaria, idiopathic  Intractable migraine without aura and without status migrainosus  Eczema, dyshidrotic  Family history of systemic lupus erythematosus, mother and brother  Orders: Orders Placed This Encounter  Procedures   CBC with Differential/Platelet   COMPLETE METABOLIC PANEL WITH GFR   Sedimentation rate   No orders of the defined types were placed in this encounter.    Follow-Up Instructions: Return in about 6 weeks (around 09/24/2022) for Rheumatoid arthritis, Osteoarthritis.   Bo Merino, MD  Note - This record has been created using Editor, commissioning.  Chart creation errors have been sought, but may not always  have been located. Such creation errors do not reflect on  the standard of medical care.

## 2022-08-07 ENCOUNTER — Other Ambulatory Visit (HOSPITAL_COMMUNITY): Payer: Self-pay

## 2022-08-09 ENCOUNTER — Other Ambulatory Visit: Payer: Self-pay

## 2022-08-12 ENCOUNTER — Other Ambulatory Visit: Payer: Self-pay | Admitting: Physician Assistant

## 2022-08-12 NOTE — Telephone Encounter (Signed)
Next Visit: 08/12/2022   Last Visit: 04/17/2022   Last Fill: 05/08/2022  [id number] rheumatoid arthritis    Current Dose per office note 04/17/2022: Arava 20 mg 1 tablet by mouth daily   Labs: 04/17/2022 CMP WNL White blood cell count is low: 3.0.  Absolute neutrophil count is borderline low.  Hemoglobin is borderline low: 11.5.    Patient to update labs at upcoming appointment on 08/12/2022  Okay to refill Arava?

## 2022-08-13 ENCOUNTER — Telehealth: Payer: Self-pay | Admitting: Pharmacist

## 2022-08-13 ENCOUNTER — Encounter: Payer: Self-pay | Admitting: Rheumatology

## 2022-08-13 ENCOUNTER — Ambulatory Visit: Payer: Medicare Other | Attending: Rheumatology | Admitting: Rheumatology

## 2022-08-13 VITALS — BP 165/92 | HR 72 | Resp 16 | Ht 62.0 in | Wt 136.8 lb

## 2022-08-13 DIAGNOSIS — M170 Bilateral primary osteoarthritis of knee: Secondary | ICD-10-CM

## 2022-08-13 DIAGNOSIS — M059 Rheumatoid arthritis with rheumatoid factor, unspecified: Secondary | ICD-10-CM | POA: Diagnosis not present

## 2022-08-13 DIAGNOSIS — M19041 Primary osteoarthritis, right hand: Secondary | ICD-10-CM | POA: Diagnosis not present

## 2022-08-13 DIAGNOSIS — M25572 Pain in left ankle and joints of left foot: Secondary | ICD-10-CM | POA: Diagnosis not present

## 2022-08-13 DIAGNOSIS — G43019 Migraine without aura, intractable, without status migrainosus: Secondary | ICD-10-CM

## 2022-08-13 DIAGNOSIS — L501 Idiopathic urticaria: Secondary | ICD-10-CM | POA: Diagnosis not present

## 2022-08-13 DIAGNOSIS — Z8269 Family history of other diseases of the musculoskeletal system and connective tissue: Secondary | ICD-10-CM

## 2022-08-13 DIAGNOSIS — Z79899 Other long term (current) drug therapy: Secondary | ICD-10-CM

## 2022-08-13 DIAGNOSIS — Z227 Latent tuberculosis: Secondary | ICD-10-CM

## 2022-08-13 DIAGNOSIS — M19071 Primary osteoarthritis, right ankle and foot: Secondary | ICD-10-CM | POA: Diagnosis not present

## 2022-08-13 DIAGNOSIS — M19042 Primary osteoarthritis, left hand: Secondary | ICD-10-CM

## 2022-08-13 DIAGNOSIS — M19072 Primary osteoarthritis, left ankle and foot: Secondary | ICD-10-CM

## 2022-08-13 DIAGNOSIS — L301 Dyshidrosis [pompholyx]: Secondary | ICD-10-CM

## 2022-08-13 DIAGNOSIS — R030 Elevated blood-pressure reading, without diagnosis of hypertension: Secondary | ICD-10-CM | POA: Diagnosis not present

## 2022-08-13 NOTE — Patient Instructions (Addendum)
Please get your Shingles vaccine this week if possible. We highly recommend receiving at least hte first dose before starting Rinvoq. Your local pharmacy will be able to complete.  Please set up appointment with your primary care provider for your blood pressure   Upadacitinib Extended-Release Tablets What is this medication? UPADACITINIB (ue PAD a SYE ti nib) treats autoimmune conditions, such as arthritis, eczema, and ulcerative colitis. It is prescribed when other medications have not worked or cannot be tolerated. It works by slowing down an overactive immune system. This decreases inflammation. This medicine may be used for other purposes; ask your health care provider or pharmacist if you have questions. COMMON BRAND NAME(S): RINVOQ What should I tell my care team before I take this medication? They need to know if you have any of these conditions: Blood clots Cancer Diabetes (high blood sugar) Heart disease High blood pressure High cholesterol Immune system problems Infection, especially a viral infection, such as chickenpox, cold sores, or herpes Infection, such as tuberculosis (TB), or other bacterial, fungal or viral infection Kidney disease Liver disease Low blood cell levels (white cells, red cells, and platelets) Lung or breathing disease, such as asthma or COPD Organ transplant Recent or upcoming vaccine Skin cancer/melanoma Stomach or intestine problems Stroke Tobacco use An unusual or allergic reaction to upadacitinib, other medications, foods, dyes or preservatives Pregnant or trying to get pregnant Breast-feeding How should I use this medication? Take this medication by mouth with water. Take it as directed on the prescription label at the same time every day. Do not cut, crush, or chew this medication. Swallow the tablets whole. You can take it with or without food. If it upsets your stomach, take it with food. Do not take this medication with grapefruit juice.  Keep taking it unless your care team tells you to stop. A special MedGuide will be given to you by the pharmacist with each prescription and refill. Be sure to read this information carefully each time. Talk to your care team about the use of this medication in children. While this medication may be prescribed for children as young as 12 years for selected conditions, precautions do apply. Overdosage: If you think you have taken too much of this medicine contact a poison control center or emergency room at once. NOTE: This medicine is only for you. Do not share this medicine with others. What if I miss a dose? If you miss a dose, take it as soon as you can. If it is almost time for your next dose, take only that dose. Do not take double or extra doses. What may interact with this medication? Do not take this medication with any of the following: Baricitinib Tofacitinib This medication may also interact with the following: Azathioprine Certain antivirals for hepatitis or HIV Biologic medications, such as abatacept, adalimumab, anakinra, certolizumab, etanercept, golimumab, infliximab, rituximab, secukinumab, tocilizumab, ustekinumab Certain medications for fungal infections, such as ketoconazole, itraconazole, posaconazole, or voriconazole Certain medications for seizures, such as carbamazepine, phenobarbital, phenytoin Clarithromycin Cyclosporine Grapefruit and grapefruit juice Live virus vaccines Medications that lower your chance of fighting infection Mifepristone Nefazodone Rifampin Supplements, such as St. John's wort This list may not describe all possible interactions. Give your health care provider a list of all the medicines, herbs, non-prescription drugs, or dietary supplements you use. Also tell them if you smoke, drink alcohol, or use illegal drugs. Some items may interact with your medicine. What should I watch for while using this medication? Visit your care team  for regular  checks on your progress. Tell your care team if your symptoms do not start to get better or if they get worse. You may need blood work while taking this medication. Avoid taking medications that contain aspirin, acetaminophen, ibuprofen, naproxen, or ketoprofen unless instructed by your care team. These medications may hide a fever. This medication may increase your risk of getting an infection. Call your care team for advice if you get a fever, chills, sore throat, or other symptoms of a cold or flu. Do not treat yourself. Try to avoid being around people who are sick. Talk to your care team if you wish to become pregnant or think you might be pregnant. This medication can cause serious birth defects if taken during pregnancy and for 4 weeks after stopping it. A barrier contraceptive, such as a condom or diaphragm, is recommended while taking this medication and for 4 weeks after stopping it. Talk to your care team about other forms of contraception. Do not breastfeed while taking this medication and for 6 days after the last dose. Talk to your care team about your risk of cancer. You may be more at risk for certain types of cancer if you take this medication. Talk to your care team about your risk of skin cancer. You may be more at risk for skin cancer if you take this medication. This medication can make you more sensitive to the sun. Keep out of the sun. If you cannot avoid being in the sun, wear protective clothing and sunscreen. Do not use sun lamps or tanning beds/booths. Tell your care team right away if you have any change in your eyesight. Talk to your care team if you often see part of the tablet in your stool. What side effects may I notice from receiving this medication? Side effects that you should report to your care team as soon as possible: Allergic reactions--skin rash, itching, hives, swelling of the face, lips, tongue, or throat Blood clot--pain, swelling, or warmth in the leg,  shortness of breath, chest pain Change in vision Heart attack--pain or tightness in the chest, shoulders, arms, or jaw, nausea, shortness of breath, cold or clammy skin, feeling faint or lightheaded Infection--fever, chills, cough, sore throat, wounds that don't heal, pain or trouble when passing urine, general feeling of discomfort or being unwell Liver injury--right upper belly pain, loss of appetite, nausea, light-colored stool, dark yellow or brown urine, yellowing skin or eyes, unusual weakness or fatigue Low red blood cell level--unusual weakness or fatigue, dizziness, headache, trouble breathing Sudden or severe stomach pain, bloody diarrhea, fever, nausea, vomiting Stroke--sudden numbness or weakness of the face, arm, or leg, trouble speaking, confusion, trouble walking, loss of balance or coordination, dizziness, severe headache, change in vision Side effects that usually do not require medical attention (report to your care team if they continue or are bothersome): Acne Cough Headache Nausea Runny or stuffy nose This list may not describe all possible side effects. Call your doctor for medical advice about side effects. You may report side effects to FDA at 1-800-FDA-1088. Where should I keep my medication? Keep out of the reach of children and pets. Store at room temperature between 20 and 25 degrees C (68 and 77 degrees F). Protect from moisture. Keep the container tightly closed. Keep this medication in the original container until you are ready to take it. Get rid of any unused medication after the expiration date. To get rid of medications that are no longer needed or  have expired: Take the medication to a medication take-back program. Check with your pharmacy or law enforcement to find a location. If you cannot return the medication, check the label or package insert to see if the medication should be thrown out in the garbage or flushed down the toilet. If you are not sure, ask  your care team. If it is safe to put it in the trash, empty the medication out of the container. Mix the medication with cat litter, dirt, coffee grounds, or other unwanted substance. Seal the mixture in a bag or container. Put it in the trash. NOTE: This sheet is a summary. It may not cover all possible information. If you have questions about this medicine, talk to your doctor, pharmacist, or health care provider.  2023 Elsevier/Gold Standard (2021-04-18 00:00:00)  Because you are taking Morrie Sheldon, Rinvoq, or Olumiant, it is very important to know that this class of medications has a FDA BLACK BOX WARNING for major adverse cardiovascular events (MACE), thrombosis, mortality (including sudden cardiovascular death), serious infections, and lymphomas. MACE is defined as cardiovascular death, myocardial infarction, and stroke. Thrombosis includes deep venous thrombosis (DVT), pulmonary embolism (PE), and arterial thrombosis. If you are a current or former smoker, you are at higher risk for MACE.    Standing Labs We placed an order today for your standing lab work.   Please have your standing labs drawn in one month after starting Rinvoq and every 3 months  TB Gold with next lab  Please have your labs drawn 2 weeks prior to your appointment so that the provider can discuss your lab results at your appointment.  Please note that you may see your imaging and lab results in [facility] before we have reviewed them. We will contact you once all results are reviewed. Please allow our office up to 72 hours to thoroughly review all of the results before contacting the office for clarification of your results.  Lab hours are:   Monday through Thursday from 8:00 am -12:30 pm and 1:00 pm-5:00 pm and Friday from 8:00 am-12:00 pm.  Please be advised, all patients with office appointments requiring lab work will take precedent over walk-in lab work.   Labs are drawn by [facility]. Please bring your co-pay at the  time of your lab draw.  You may receive a bill from [facility] for your lab work.  Please note if you are on Hydroxychloroquine and and an order has been placed for a Hydroxychloroquine level, you will need to have it drawn 4 hours or more after your last dose.  If you wish to have your labs drawn at another location, please call the office 24 hours in advance so we can fax the orders.  The office is located at [address] [place], [place], [place] [address] No appointment is necessary.    If you have any questions regarding directions or hours of operation,  please call [phone].   As a reminder, please drink plenty of water prior to coming for your lab work. Thanks!   Vaccines You are taking a medication(s) that can suppress your immune system.  The following immunizations are recommended: Flu annually Covid-19  RSV Td/Tdap (tetanus, diphtheria, pertussis) every 10 years Pneumonia (Prevnar 15 then Pneumovax 23 at least 1 year apart.  Alternatively, can take Prevnar 20 without needing additional dose) Shingrix: 2 doses from 4 weeks to 6 months apart  Please check with your PCP to make sure you are up to date.   If  you have signs or symptoms of an infection or start antibiotics: First, call your PCP for workup of your infection. Hold your medication through the infection, until you complete your antibiotics, and until symptoms resolve if you take the following: Injectable medication (Actemra, Benlysta, Cimzia, Cosentyx, Enbrel, Humira, Kevzara, Orencia, Remicade, Simponi, Stelara, Taltz, Tremfya) Methotrexate Leflunomide (Arava) Mycophenolate (Cellcept) Morrie Sheldon, Olumiant, or Rinvoq

## 2022-08-13 NOTE — Telephone Encounter (Signed)
Submitted a Prior Authorization request to [facility] for [facility] via CoverMyMeds. Will update once we receive a response.  Key: [facility]  Knox Saliva, PharmD, MPH, BCPS, CPP Clinical Pharmacist (Rheumatology and Pulmonology)

## 2022-08-13 NOTE — Progress Notes (Signed)
Pharmacy Note  Subjective: Patient presents today to [facility] for follow up office visit. Patient seen by the pharmacist for counseling on Rinvoq for rheumatoid arthritis..  Previous therapy include:Actemra (current) - inadequate response. Leflunomide (current) -   History of diverticulitis:  No  History of MI, stroke, or CV events:  No  Objective:  CMP     Component Value Date/Time   NA 142 04/17/2022 1006   K 3.8 04/17/2022 1006   CL 108 04/17/2022 1006   CO2 28 04/17/2022 1006   GLUCOSE 70 04/17/2022 1006   BUN 13 04/17/2022 1006   CREATININE 0.66 04/17/2022 1006   CALCIUM 9.6 04/17/2022 1006   PROT 6.5 04/17/2022 1006   ALBUMIN 3.9 03/08/2021 1007   AST 30 04/17/2022 1006   ALT 24 04/17/2022 1006   ALKPHOS 66 03/08/2021 1007    CBC    Component Value Date/Time   WBC 3.0 (L) 04/17/2022 1006   RBC 4.14 04/17/2022 1006   HGB 11.5 (L) 04/17/2022 1006   HCT 35.3 04/17/2022 1006   PLT 191 04/17/2022 1006   MCV 85.3 04/17/2022 1006   MCH 27.8 04/17/2022 1006   MCHC 32.6 04/17/2022 1006   RDW 14.7 04/17/2022 1006    Baseline Immunosuppressant Therapy Labs TB GOLD    Latest Ref Rng & Units 10/09/2021   11:11 AM  Quantiferon TB Gold  Quantiferon TB Gold Plus NEGATIVE NEGATIVE    Hepatitis Panel    Latest Ref Rng & Units 10/09/2021   11:11 AM  Hepatitis  Hep B Surface Ag NON-REACTIVE NON-REACTIVE   Hep B IgM NON-REACTIVE NON-REACTIVE   Hep C Ab NON-REACTIVE NON-REACTIVE    HIV Lab Results  Component Value Date   HIV NON-REACTIVE 10/09/2021   HIV NON-REACTIVE 06/29/2018   Immunoglobulins    Latest Ref Rng & Units 10/09/2021   11:11 AM  Immunoglobulin Electrophoresis  IgA  70 - 320 mg/dL 531   IgG 600 - 1,540 mg/dL 1,594   IgM 50 - 300 mg/dL 416    SPEP    Latest Ref Rng & Units 04/17/2022   10:06 AM  Serum Protein Electrophoresis  Total Protein 6.1 - 8.1 g/dL 6.5    G6PD No results found for: "G6PDH" TPMT No results found for:  "TPMT"   Lipid Panel Lab Results  Component Value Date   CHOL 172 03/06/2022   HDL 42 (L) 03/06/2022   LDLCALC 113 (H) 03/06/2022   TRIG 80 03/06/2022   CHOLHDL 4.1 03/06/2022     Assessment/Plan:  Counseled patient that Rinvoq is a JAK inhibitor indicated for Rheumatoid Arthritis.  Counseled patient on purpose, proper use, and adverse effects of Rinvoq.    Reviewed the most common adverse effects including infection, diarrhea, headaches.  Also reviewed rare adverse effects such as bowel injury and the need to contact us if they develop stomach pain during treatment. Counseled on the increase risk of venous thrombosis. Counseled about FDA black box warning of MACE (major adverse CV events including cardiovascular death, myocardial infarction, and stroke).  Reviewed with patient that there is the possibility of an increased risk of malignancy specifically lung cancer and lymphomas but it is not well understood if this increased risk is due to the medication or the disease state. Instructed patient that medication should be held for infection and prior to surgery.  Advised patient to avoid live vaccines. Recommend annual influenza, PCV 15 or PCV20 or Pneumovax 23, and Shingrix as indicated.   She plans  to get Shingles vaccine.  Reviewed importance of routine lab monitoring including lipid panel.  Will recheck lipid panel 3 months after starting and annually thereafter. CBC and CMP will be monitored routinely every 3 months. Standing orders placed. Provided patient with medication education material and answered all questions.  Patient consented to Rinvoq.  Will upload into patient's chart.  Will apply through patient's insurance and update when we receive a response.    Patient dose will be 15 mg daily.  Prescription will be sent to pharmacy pending lab results and insurance approval.  She is due for next Actemra dose on 08/15/2022. She has bene advised to hold Actemra dose this week  She will f/u  with PCP for BP assessment.  Knox Saliva, PharmD, MPH, BCPS, CPP Clinical Pharmacist (Rheumatology and Pulmonology)

## 2022-08-13 NOTE — Telephone Encounter (Signed)
Please start Rinvoq BIV  Dose: 77m once daily  Next Actemra dose is due 08/15/2022 - she plans to hold.  She plans to receive first Shingles vaccine as well  DKnox Saliva PharmD, MPH, BCPS, CPP Clinical Pharmacist (Rheumatology and Pulmonology)

## 2022-08-14 ENCOUNTER — Other Ambulatory Visit: Payer: Self-pay | Admitting: Internal Medicine

## 2022-08-14 DIAGNOSIS — M159 Polyosteoarthritis, unspecified: Secondary | ICD-10-CM

## 2022-08-14 LAB — COMPLETE METABOLIC PANEL WITH GFR
AG Ratio: 1.4 (calc) (ref 1.0–2.5)
ALT: 16 U/L (ref 6–29)
AST: 26 U/L (ref 10–35)
Albumin: 3.7 g/dL (ref 3.6–5.1)
Alkaline phosphatase (APISO): 65 U/L (ref 37–153)
BUN/Creatinine Ratio: 23 (calc) — ABNORMAL HIGH (ref 6–22)
BUN: 11 mg/dL (ref 7–25)
CO2: 27 mmol/L (ref 20–32)
Calcium: 9.1 mg/dL (ref 8.6–10.4)
Chloride: 106 mmol/L (ref 98–110)
Creat: 0.48 mg/dL — ABNORMAL LOW (ref 0.50–1.05)
Globulin: 2.7 g/dL (calc) (ref 1.9–3.7)
Glucose, Bld: 98 mg/dL (ref 65–99)
Potassium: 3.9 mmol/L (ref 3.5–5.3)
Sodium: 139 mmol/L (ref 135–146)
Total Bilirubin: 0.6 mg/dL (ref 0.2–1.2)
Total Protein: 6.4 g/dL (ref 6.1–8.1)
eGFR: 104 mL/min/{1.73_m2} (ref >=60)

## 2022-08-14 LAB — CBC WITH DIFFERENTIAL/PLATELET
Absolute Monocytes: 616 cells/uL (ref 200–950)
Basophils Absolute: 110 cells/uL (ref 0–200)
Basophils Relative: 2.9 %
Eosinophils Absolute: 338 cells/uL (ref 15–500)
Eosinophils Relative: 8.9 %
HCT: 33.3 % — ABNORMAL LOW (ref 35.0–45.0)
Hemoglobin: 10.9 g/dL — ABNORMAL LOW (ref 11.7–15.5)
Lymphs Abs: 1653 cells/uL (ref 850–3900)
MCH: 28.2 pg (ref 27.0–33.0)
MCHC: 32.7 g/dL (ref 32.0–36.0)
MCV: 86 fL (ref 80.0–100.0)
MPV: 11.6 fL (ref 7.5–12.5)
Monocytes Relative: 16.2 %
Neutro Abs: 1083 cells/uL — ABNORMAL LOW (ref 1500–7800)
Neutrophils Relative %: 28.5 %
Platelets: 199 10*3/uL (ref 140–400)
RBC: 3.87 10*6/uL (ref 3.80–5.10)
RDW: 13.5 % (ref 11.0–15.0)
Total Lymphocyte: 43.5 %
WBC: 3.8 10*3/uL (ref 3.8–10.8)

## 2022-08-14 LAB — SEDIMENTATION RATE: Sed Rate: 6 mm/h (ref 0–30)

## 2022-08-14 NOTE — Progress Notes (Signed)
Sed rate is normal which does not indicate inflammation.  CBC and CMP are stable.  Hemoglobin is low at 10.9.  Patient should take multivitamin with iron.  Please forward results to her PCP.

## 2022-08-21 ENCOUNTER — Other Ambulatory Visit: Payer: Self-pay

## 2022-08-21 ENCOUNTER — Other Ambulatory Visit (HOSPITAL_COMMUNITY): Payer: Self-pay

## 2022-08-21 MED ORDER — RINVOQ 15 MG PO TB24
15.0000 mg | ORAL_TABLET | Freq: Every day | ORAL | 2 refills | Status: DC
  Filled 2022-08-21 (×2): qty 30, 30d supply, fill #0
  Filled 2022-09-10: qty 30, 30d supply, fill #1
  Filled 2022-10-09: qty 30, 30d supply, fill #2

## 2022-08-21 NOTE — Telephone Encounter (Addendum)
Received notification from [facility] regarding a prior authorization for [facility]. Authorization has been APPROVED from 08/14/22 to 02/12/23. Approval letter sent to scan center.  Per test claim, copay for 30 days supply is $11.20  Patient can fill through [facility]: [phone]   Authorization # [id number]  Patient will continue lefluonomide in combination with Rinvoq 15mg  PO daily.  Rx sent to [facility] today. Spoke with pt and advised that [facility] will be reaching out to schedule first shipment. Routing to [place] for onboarding needs.  Chesley Mires, PharmD, MPH, BCPS, CPP Clinical Pharmacist (Rheumatology and Pulmonology)

## 2022-08-21 NOTE — Telephone Encounter (Signed)
Delivery instructions have been updated in [facility], medication will be shipped to patient's home address by 08/23/22.  Rx has been processed in [facility] and there is a copay of $11.20.

## 2022-08-22 ENCOUNTER — Other Ambulatory Visit (HOSPITAL_COMMUNITY): Payer: Self-pay

## 2022-08-22 ENCOUNTER — Other Ambulatory Visit: Payer: Self-pay

## 2022-09-06 LAB — HM MAMMOGRAPHY

## 2022-09-10 ENCOUNTER — Other Ambulatory Visit (HOSPITAL_COMMUNITY): Payer: Self-pay

## 2022-09-10 NOTE — Progress Notes (Deleted)
Office Visit Note  Patient: Melanie Garrett             Date of Birth: [birth year]           MRN: [id number]             PCP: Janith Lima, MD Referring: Janith Lima, MD Visit Date: 09/24/2022 Occupation: @GUAROCC @  Subjective:  Medication monitoring   History of Present Illness: Melanie Garrett is a 67 y.o. female with history of seropositive rheumatoid arthritis and osteoarthritis.  She is taking Rinvoq 15 mg daily and Arava 20 mg daily. Rinvoq was added after her last office visit.    TB gold negative on 10/09/21.  CBC and CMP updated on 08/13/22. Orders for CBC and CMP released today.  Advised patient to holding rinvoq and arava if she develops signs or symptoms of an infection and to resume once the infection has completely cleared.    Activities of Daily Living:  Patient reports morning stiffness for *** {minute/hour:19697}.   Patient {ACTIONS;DENIES/REPORTS:21021675::"Denies"} nocturnal pain.  Difficulty dressing/grooming: {ACTIONS;DENIES/REPORTS:21021675::"Denies"} Difficulty climbing stairs: {ACTIONS;DENIES/REPORTS:21021675::"Denies"} Difficulty getting out of chair: {ACTIONS;DENIES/REPORTS:21021675::"Denies"} Difficulty using hands for taps, buttons, cutlery, and/or writing: {ACTIONS;DENIES/REPORTS:21021675::"Denies"}  No Rheumatology ROS completed.   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   Intractable migraine without aura and without status migrainosus 08/21/2020   Screening for cervical cancer 08/21/2020   Screen for colon cancer 08/16/2020   Visit for screening mammogram 08/16/2020   Hypokalemia due to inadequate potassium intake 08/16/2020   Osteoarthritis 02/09/2020   Obesity (BMI 30.0-34.9) 06/29/2018   Urticaria, idiopathic 09/02/2016   Routine general medical examination  at a health care facility 09/02/2016   Eczema, dyshidrotic 02/02/2015    Past Medical History:  Diagnosis Date   Osteoarthritis 02/09/2020   RA (rheumatoid arthritis) ([place]) 04/05/2021    Family History  Problem Relation Age of Onset   Arthritis Mother    Lupus Sister    Lupus Brother    Seizures Brother    Hypertension Daughter    Arthritis Daughter    Hypertension Daughter    Arthritis Daughter    Alcohol abuse Neg Hx    Cancer Neg Hx    Diabetes Neg Hx    Drug abuse Neg Hx    Early death Neg Hx    Heart disease Neg Hx    Stroke Neg Hx    Past Surgical History:  Procedure Laterality Date   KNEE SURGERY Left    acl repair   Social History   Social History Narrative   Not on file   Immunization History  Administered Date(s) Administered   Influenza,inj,Quad PF,6+ Mos 06/29/2018, 03/08/2021   Influenza-Unspecified 02/23/2015, 03/24/2016, 03/24/2017   PNEUMOCOCCAL CONJUGATE-20 07/12/2021   Td 04/27/2010   Tdap 05/04/2013     Objective: Vital Signs: There were no vitals taken for this visit.   Physical Exam Vitals and nursing note reviewed.  Constitutional:      Appearance: She is well-developed.  HENT:     Head: Normocephalic and atraumatic.  Eyes:     Conjunctiva/sclera: Conjunctivae normal.  Cardiovascular:     Rate and Rhythm: Normal rate and regular rhythm.     Heart sounds: Normal heart sounds.  Pulmonary:     Effort: Pulmonary effort is normal.     Breath  sounds: Normal breath sounds.  Abdominal:     General: Bowel sounds are normal.     Palpations: Abdomen is soft.  Musculoskeletal:     Cervical back: Normal range of motion.  Lymphadenopathy:     Cervical: No cervical adenopathy.  Skin:    General: Skin is warm and dry.     Capillary Refill: Capillary refill takes less than 2 seconds.  Neurological:     Mental Status: She is alert and oriented to person, place, and time.  Psychiatric:        Behavior: Behavior normal.       Musculoskeletal Exam: ***  CDAI Exam: CDAI Score: -- Patient Global: --; Provider Global: -- Swollen: --; Tender: -- Joint Exam 09/24/2022   No joint exam has been documented for this visit   There is currently no information documented on the homunculus. Go to the Rheumatology activity and complete the homunculus joint exam.  Investigation: No additional findings.  Imaging: No results found.  Recent Labs: Lab Results  Component Value Date   WBC 3.8 08/13/2022   HGB 10.9 (L) 08/13/2022   PLT 199 08/13/2022   NA 139 08/13/2022   K 3.9 08/13/2022   CL 106 08/13/2022   CO2 27 08/13/2022   GLUCOSE 98 08/13/2022   BUN 11 08/13/2022   CREATININE 0.48 (L) 08/13/2022   BILITOT 0.6 08/13/2022   ALKPHOS 66 03/08/2021   AST 26 08/13/2022   ALT 16 08/13/2022   PROT 6.4 08/13/2022   ALBUMIN 3.9 03/08/2021   CALCIUM 9.1 08/13/2022   QFTBGOLDPLUS NEGATIVE 10/09/2021    Speciality Comments: dxd 08/21 [place] rheum MTX, Arava-inadequate response.  Intermittent use of Orencia due to positive TB Gold, (stopped on 03/07/22), Actemra started 03/14/22  rifampin 08/2022X 4 months  Procedures:  No procedures performed Allergies: Patient has no known allergies.   Assessment / Plan:     Visit Diagnoses: Seropositive rheumatoid arthritis ([place])  High risk medication use  TB lung, latent  Primary osteoarthritis of both hands  Primary osteoarthritis of both knees  Primary osteoarthritis of both feet  Pain in left ankle and joints of left foot  Urticaria, idiopathic  Intractable migraine without aura and without status migrainosus  Eczema, dyshidrotic  Family history of systemic lupus erythematosus, mother and brother  Orders: No orders of the defined types were placed in this encounter.  No orders of the defined types were placed in this encounter.   Face-to-face time spent with patient was *** minutes. Greater than 50% of time was spent in counseling and coordination of  care.  Follow-Up Instructions: No follow-ups on file.   Ofilia Neas, PA-C  Note - This record has been created using Dragon software.  Chart creation errors have been sought, but may not always  have been located. Such creation errors do not reflect on  the standard of medical care.

## 2022-09-13 ENCOUNTER — Other Ambulatory Visit: Payer: Self-pay

## 2022-09-24 ENCOUNTER — Ambulatory Visit: Payer: Self-pay | Admitting: Physician Assistant

## 2022-09-24 DIAGNOSIS — M19071 Primary osteoarthritis, right ankle and foot: Secondary | ICD-10-CM

## 2022-09-24 DIAGNOSIS — L501 Idiopathic urticaria: Secondary | ICD-10-CM

## 2022-09-24 DIAGNOSIS — G43019 Migraine without aura, intractable, without status migrainosus: Secondary | ICD-10-CM

## 2022-09-24 DIAGNOSIS — M25572 Pain in left ankle and joints of left foot: Secondary | ICD-10-CM

## 2022-09-24 DIAGNOSIS — M19041 Primary osteoarthritis, right hand: Secondary | ICD-10-CM

## 2022-09-24 DIAGNOSIS — M170 Bilateral primary osteoarthritis of knee: Secondary | ICD-10-CM

## 2022-09-24 DIAGNOSIS — Z79899 Other long term (current) drug therapy: Secondary | ICD-10-CM

## 2022-09-24 DIAGNOSIS — L301 Dyshidrosis [pompholyx]: Secondary | ICD-10-CM

## 2022-09-24 DIAGNOSIS — Z227 Latent tuberculosis: Secondary | ICD-10-CM

## 2022-09-24 DIAGNOSIS — Z8269 Family history of other diseases of the musculoskeletal system and connective tissue: Secondary | ICD-10-CM

## 2022-09-24 DIAGNOSIS — M059 Rheumatoid arthritis with rheumatoid factor, unspecified: Secondary | ICD-10-CM

## 2022-10-03 NOTE — Progress Notes (Signed)
Office Visit Note  Patient: Melanie Garrett             Date of Birth: 1956/03/17           MRN: [id number]             PCP: Etta Grandchild, MD Referring: Etta Grandchild, MD Visit Date: 10/17/2022 Occupation: @GUAROCC @  Subjective:  Left ankle pain and swelling  History of Present Illness: Melanie Garrett is a 67 y.o. female with history of seropositive rheumatoid arthritis and osteoarthritis.  She was started on Rinvoq 15 mg p.o. daily on August 21, 2022.  She continues to be on leflunomide 20 mg p.o. daily.  She has noticed improvement in her overall pain and swelling since she has been on Rinvoq.  She continues to have some pain and swelling in her left ankle joint.  None of the other joints are swollen.  She was previously on Actemra which was not very effective.  She has been walking for exercise.  She states she has been active and doing routine activities.    Activities of Daily Living:  Patient reports morning stiffness for 0 minutes.   Patient Denies nocturnal pain.  Difficulty dressing/grooming: Denies Difficulty climbing stairs: Denies Difficulty getting out of chair: Denies Difficulty using hands for taps, buttons, cutlery, and/or writing: Reports  Review of Systems  Constitutional:  Negative for fatigue.  HENT:  Negative for mouth sores and mouth dryness.   Eyes:  Negative for dryness.  Respiratory:  Negative for shortness of breath.   Cardiovascular:  Positive for swelling in legs/feet. Negative for chest pain and palpitations.  Gastrointestinal:  Negative for blood in stool, constipation and diarrhea.  Endocrine: Negative for increased urination.  Genitourinary:  Negative for involuntary urination.  Musculoskeletal:  Negative for joint pain, gait problem, joint pain, joint swelling, myalgias, muscle weakness, morning stiffness, muscle tenderness and myalgias.  Skin:  Negative for color change, rash, hair loss and sensitivity to sunlight.  Allergic/Immunologic:  Negative for susceptible to infections.  Neurological:  Negative for dizziness and headaches.  Hematological:  Negative for swollen glands.  Psychiatric/Behavioral:  Negative for depressed mood and sleep disturbance. The patient is not nervous/anxious.     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   Intractable migraine without aura and without status migrainosus 08/21/2020   Screening for cervical cancer 08/21/2020   Screen for colon cancer 08/16/2020   Visit for screening mammogram 08/16/2020   Hypokalemia due to inadequate potassium intake 08/16/2020   Osteoarthritis 02/09/2020   Obesity (BMI 30.0-34.9) 06/29/2018   Urticaria, idiopathic 09/02/2016   Routine general medical examination at a health care facility 09/02/2016   Eczema, dyshidrotic 02/02/2015    Past Medical History:  Diagnosis Date   Osteoarthritis 02/09/2020   RA (rheumatoid arthritis) 04/05/2021    Family History  Problem Relation Age of Onset   Arthritis Mother    Lupus Sister    Lupus Brother    Seizures Brother    Hypertension Daughter    Arthritis Daughter    Hypertension Daughter    Arthritis Daughter    Alcohol abuse Neg Hx    Cancer Neg Hx    Diabetes Neg Hx    Drug abuse Neg Hx    Early death Neg Hx    Heart disease Neg  Hx    Stroke Neg Hx    Past Surgical History:  Procedure Laterality Date   KNEE SURGERY Left    acl repair   Social History   Social History Narrative   Not on file   Immunization History  Administered Date(s) Administered   Influenza,inj,Quad PF,6+ Mos 06/29/2018, 03/08/2021   Influenza-Unspecified 02/23/2015, 03/24/2016, 03/24/2017   PNEUMOCOCCAL CONJUGATE-20 07/12/2021   Td 04/27/2010   Tdap 05/04/2013     Objective: Vital Signs: BP (!) 147/88 (BP Location: Left Arm, Patient Position:  Sitting, Cuff Size: Normal)   Pulse 80   Resp 16   Ht  (1.575 m)   Wt 151 lb (68.5 kg)   BMI 27.62 kg/m    Physical Exam Vitals and nursing note reviewed.  Constitutional:      Appearance: She is well-developed.  HENT:     Head: Normocephalic and atraumatic.  Eyes:     Conjunctiva/sclera: Conjunctivae normal.  Cardiovascular:     Rate and Rhythm: Normal rate and regular rhythm.     Heart sounds: Normal heart sounds.  Pulmonary:     Effort: Pulmonary effort is normal.     Breath sounds: Normal breath sounds.  Abdominal:     General: Bowel sounds are normal.     Palpations: Abdomen is soft.  Musculoskeletal:     Cervical back: Normal range of motion.  Lymphadenopathy:     Cervical: No cervical adenopathy.  Skin:    General: Skin is warm and dry.     Capillary Refill: Capillary refill takes less than 2 seconds.  Neurological:     Mental Status: She is alert and oriented to person, place, and time.  Psychiatric:        Behavior: Behavior normal.      Musculoskeletal Exam: Cervical spine was in good range of motion.  Shoulder joints, elbow joints, wrist joints were in good range of motion without any synovitis.  She has synovial thickening over the right fourth MCP joint and right fourth PIP joint.  Mild synovitis was noted in the right fourth PIP joint.  Hip joints and knee joints were in good range of motion without any warmth swelling or effusion.  She had just swelling and warmth over her left ankle joint.  CDAI Exam: CDAI Score: -- Patient Global: --; Provider Global: -- Swollen: --; Tender: -- Joint Exam 10/17/2022   No joint exam has been documented for this visit   There is currently no information documented on the homunculus. Go to the Rheumatology activity and complete the homunculus joint exam.  Investigation: No additional findings.  Imaging: No results found.  Recent Labs: Lab Results  Component Value Date   WBC 3.8 08/13/2022   HGB 10.9 (L)  08/13/2022   PLT 199 08/13/2022   NA 139 08/13/2022   K 3.9 08/13/2022   CL 106 08/13/2022   CO2 27 08/13/2022   GLUCOSE 98 08/13/2022   BUN 11 08/13/2022   CREATININE 0.48 (L) 08/13/2022   BILITOT 0.6 08/13/2022   ALKPHOS 66 03/08/2021   AST 26 08/13/2022   ALT 16 08/13/2022   PROT 6.4 08/13/2022   ALBUMIN 3.9 03/08/2021   CALCIUM 9.1 08/13/2022   QFTBGOLDPLUS NEGATIVE 10/09/2021    Speciality Comments: dxd 08/21 [facility] MTX, Arava-inadequate response.  Intermittent use of Orencia due to positive TB Gold, (stopped on 03/07/22), Actemra started 03/14/22  rifampin 08/2022X 4 months  Procedures:  No procedures performed Allergies: Patient has no known allergies.  Assessment / Plan:     Visit Diagnoses: Seropositive rheumatoid arthritis - +RF,+CCP Ab - positive ANA, elevated sedimentation rate, severe synovitis.  DXd 08/21 at [facility]: Patient is doing much better on Rinvoq and leflunomide combination.  Rinvoq was added on August 21, 2022.  She has been tolerating Rinvoq without any side effects.  Patient is pleased with the response to Rinvoq.  She states besides her left ankle joint all other joints are doing better.  Inflammation and synovitis was noted in her left ankle joint.  None of the other joints were swollen.  Will wait until the follow-up visit.  I discussed possible cortisone injection to her right ankle joint if she continues to have persistent swelling in the right ankle.  High risk medication use - Rinvoq 15 mg 1 tablet by mouth daily and arava 20 mg 1 tablet daily. inadequate response to actemra -Labs on August 21, 2022 CBC was normal except hemoglobin of 10.9.  CMP was normal.  TB Gold was negative on October 09, 2021.  She was advised to get labs in May. Monitor labs every 3 months.  Will also get TB Gold with her next labs and fasting lipid panel.  Plan: CBC with Differential/Platelet, COMPLETE METABOLIC PANEL WITH GFR, QuantiFERON-TB Gold Plus.  Information  regarding medication was placed in the AVS.  She was advised to hold Rinvoq and leflunomide if she develops an infection.  Blackbox warning for major adverse cardiovascular events, thrombosis, mortality, lymph and serious infections was revised with patient and her daughter.  The information was placed in the AVS.  Use of sunscreen was also advised.  Annual skin examination to screen for skin cancer was also advised.  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  CXR on 10/12/21 no active cardiopulmonary disease. TB gold- on 10/09/21.  Primary osteoarthritis of both hands-she continues to have rheumatoid arthritis and osteoarthritis overlap with synovial thickening over right fourth MCP joint and PIP joint.  Joint protection muscle strengthening was discussed.  Primary osteoarthritis of both knees-she had no warmth or swelling in her knee joints.  Swelling in her right knee joint completely resolved.  Primary osteoarthritis of both feet-she continues to have welling in her left ankle joint.  She had no tenderness over MTPs.  Pain in left ankle and joints of left foot-I discussed possible cortisone injection to her left ankle joint if she has persistent swelling.  Other medical problems are listed as follows:  Urticaria, idiopathic  Intractable migraine without aura and without status migrainosus  Eczema, dyshidrotic  Family history of systemic lupus erythematosus, mother and brother  Elevated blood pressure reading-blood pressure was elevated at 147/88.  Repeat blood pressure was normal.  Dyslipidemia -LDL was elevated in the past.  Will check lipid panel with the next labs.  Plan: Lipid panel  Orders: Orders Placed This Encounter  Procedures   CBC with Differential/Platelet   COMPLETE METABOLIC PANEL WITH GFR   QuantiFERON-TB Gold Plus   Lipid panel   No orders of the defined types were placed in this  encounter.    Follow-Up Instructions: Return in about 5 months (around 03/19/2023) for Rheumatoid arthritis, Osteoarthritis.   Pollyann Savoy, MD  Note - This record has been created using Animal nutritionist.  Chart creation errors have been sought, but may not always  have been located. Such creation errors do not reflect on  the standard of medical care.

## 2022-10-09 ENCOUNTER — Other Ambulatory Visit (HOSPITAL_COMMUNITY): Payer: Self-pay

## 2022-10-15 ENCOUNTER — Other Ambulatory Visit (HOSPITAL_COMMUNITY): Payer: Self-pay

## 2022-10-17 ENCOUNTER — Ambulatory Visit: Payer: No Typology Code available for payment source | Attending: Physician Assistant | Admitting: Rheumatology

## 2022-10-17 ENCOUNTER — Encounter: Payer: Self-pay | Admitting: Rheumatology

## 2022-10-17 ENCOUNTER — Telehealth: Payer: Self-pay | Admitting: Pharmacist

## 2022-10-17 VITALS — BP 123/71 | HR 78 | Resp 16 | Ht 62.0 in | Wt 151.0 lb

## 2022-10-17 DIAGNOSIS — M170 Bilateral primary osteoarthritis of knee: Secondary | ICD-10-CM | POA: Diagnosis not present

## 2022-10-17 DIAGNOSIS — Z8269 Family history of other diseases of the musculoskeletal system and connective tissue: Secondary | ICD-10-CM

## 2022-10-17 DIAGNOSIS — M25572 Pain in left ankle and joints of left foot: Secondary | ICD-10-CM

## 2022-10-17 DIAGNOSIS — L501 Idiopathic urticaria: Secondary | ICD-10-CM | POA: Diagnosis not present

## 2022-10-17 DIAGNOSIS — Z79899 Other long term (current) drug therapy: Secondary | ICD-10-CM

## 2022-10-17 DIAGNOSIS — M059 Rheumatoid arthritis with rheumatoid factor, unspecified: Secondary | ICD-10-CM

## 2022-10-17 DIAGNOSIS — Z227 Latent tuberculosis: Secondary | ICD-10-CM

## 2022-10-17 DIAGNOSIS — G43019 Migraine without aura, intractable, without status migrainosus: Secondary | ICD-10-CM | POA: Diagnosis not present

## 2022-10-17 DIAGNOSIS — M19041 Primary osteoarthritis, right hand: Secondary | ICD-10-CM

## 2022-10-17 DIAGNOSIS — M19072 Primary osteoarthritis, left ankle and foot: Secondary | ICD-10-CM

## 2022-10-17 DIAGNOSIS — R030 Elevated blood-pressure reading, without diagnosis of hypertension: Secondary | ICD-10-CM | POA: Diagnosis not present

## 2022-10-17 DIAGNOSIS — M19071 Primary osteoarthritis, right ankle and foot: Secondary | ICD-10-CM | POA: Diagnosis not present

## 2022-10-17 DIAGNOSIS — L301 Dyshidrosis [pompholyx]: Secondary | ICD-10-CM | POA: Diagnosis not present

## 2022-10-17 DIAGNOSIS — M19042 Primary osteoarthritis, left hand: Secondary | ICD-10-CM

## 2022-10-17 DIAGNOSIS — E785 Hyperlipidemia, unspecified: Secondary | ICD-10-CM

## 2022-10-17 NOTE — Telephone Encounter (Signed)
Submitted a Prior Authorization request to [facility] for RINVOQ via CoverMyMeds. Will update once we receive a response.  Key: [id number]

## 2022-10-17 NOTE — Telephone Encounter (Signed)
Received fax from [facility] stating patient's Rinvoq requires PA renewal. Patient has OV today. PA renewal pending OV note from today  Chesley Mires, PharmD, MPH, BCPS, CPP Clinical Pharmacist (Rheumatology and Pulmonology)

## 2022-10-17 NOTE — Patient Instructions (Signed)
Standing Labs We placed an order today for your standing lab work.   Please have your standing labs drawn in  May and every 3 months  Please have your labs drawn 2 weeks prior to your appointment so that the provider can discuss your lab results at your appointment, if possible.  Please note that you may see your imaging and lab results in MyChart before we have reviewed them. We will contact you once all results are reviewed. Please allow our office up to 72 hours to thoroughly review all of the results before contacting the office for clarification of your results.  WALK-IN LAB HOURS  Monday through Thursday from 8:00 am -12:30 pm and 1:00 pm-5:00 pm and Friday from 8:00 am-12:00 pm.  Patients with office visits requiring labs will be seen before walk-in labs.  You may encounter longer than normal wait times. Please allow additional time. Wait times may be shorter on  Monday and Thursday afternoons.  We do not book appointments for walk-in labs. We appreciate your patience and understanding with our staff.   Labs are drawn by [facility]. Please bring your co-pay at the time of your lab draw.  You may receive a bill from [facility] for your lab work.  Please note if you are on Hydroxychloroquine and and an order has been placed for a Hydroxychloroquine level,  you will need to have it drawn 4 hours or more after your last dose.  If you wish to have your labs drawn at another location, please call the office 24 hours in advance so we can fax the orders.  The office is located at [address], [address], [place], [place] [address]   If you have any questions regarding directions or hours of operation,  please call [phone].   As a reminder, please drink plenty of water prior to coming for your lab work. Thanks!   Vaccines You are taking a medication(s) that can suppress your immune system.  The following immunizations are recommended: Flu annually Covid-19  Td/Tdap (tetanus, diphtheria,  pertussis) every 10 years Pneumonia (Prevnar 15 then Pneumovax 23 at least 1 year apart.  Alternatively, can take Prevnar 20 without needing additional dose) Shingrix: 2 doses from 4 weeks to 6 months apart  Please check with your PCP to make sure you are up to date.   If you have signs or symptoms of an infection or start antibiotics: First, call your PCP for workup of your infection. Hold your medication through the infection, until you complete your antibiotics, and until symptoms resolve if you take the following: Injectable medication (Actemra, Benlysta, Cimzia, Cosentyx, Enbrel, Humira, Kevzara, Orencia, Remicade, Simponi, Stelara, Taltz, Tremfya) Methotrexate Leflunomide (Arava) Mycophenolate (Cellcept) Osborne [place], or Rinvoq   Because you are taking Harriette Ohara, Rinvoq, or Olumiant, it is very important to know that this class of medications has a FDA BLACK BOX WARNING for major adverse cardiovascular events (MACE), thrombosis, mortality (including sudden cardiovascular death), serious infections, and lymphomas. MACE is defined as cardiovascular death, myocardial infarction, and stroke. Thrombosis includes deep venous thrombosis (DVT), pulmonary embolism (PE), and arterial thrombosis. If you are a current or former smoker, you are at higher risk for MACE.   Heart Disease Prevention   Your inflammatory disease increases your risk of heart disease which includes heart attack, stroke, atrial fibrillation (irregular heartbeats), high blood pressure, heart failure and atherosclerosis (plaque in the arteries).  It is important to reduce your risk by:   Keep blood pressure, cholesterol, and blood  sugar at healthy levels   Smoking Cessation   Maintain a healthy weight  BMI 20-25   Eat a healthy diet  Plenty of fresh fruit, vegetables, and whole grains  Limit saturated fats, foods high in sodium, and added sugars  DASH and Mediterranean diet   Increase physical activity  Recommend  moderate physically activity for 150 minutes per week/ 30 minutes a day for five days a week These can be broken up into three separate ten-minute sessions during the day.   Reduce Stress  Meditation, slow breathing exercises, yoga, coloring books  Dental visits twice a year

## 2022-10-18 ENCOUNTER — Other Ambulatory Visit (HOSPITAL_COMMUNITY): Payer: Self-pay

## 2022-10-18 MED ORDER — RINVOQ 15 MG PO TB24
15.0000 mg | ORAL_TABLET | Freq: Every day | ORAL | 0 refills | Status: DC
Start: 2022-10-18 — End: 2022-11-25

## 2022-10-18 NOTE — Telephone Encounter (Signed)
Received notification from [facility] regarding a prior authorization for [facility]. Authorization has been APPROVED from 09/23/22 to 10/17/23. Approval letter sent to scan center.  Unable to run test claim since Rinvoq was filled 2 days ago - but appears last fill was transition fill only.  Patient must fill [facility]: [phone] now.  [facility] notified of change via email. Spoke with patient and her daughter, Laurelyn Sickle, regarding change. Provided with pharmacy phone number. They will call to schedule refill in 2-3 weeks.  Chesley Mires, PharmD, MPH, BCPS, CPP Clinical Pharmacist (Rheumatology and Pulmonology)

## 2022-11-08 ENCOUNTER — Other Ambulatory Visit: Payer: Self-pay | Admitting: *Deleted

## 2022-11-08 DIAGNOSIS — E785 Hyperlipidemia, unspecified: Secondary | ICD-10-CM

## 2022-11-08 DIAGNOSIS — Z79899 Other long term (current) drug therapy: Secondary | ICD-10-CM | POA: Diagnosis not present

## 2022-11-08 LAB — CBC WITH DIFFERENTIAL/PLATELET
Basophils Relative: 1.5 %
MCHC: 32.1 g/dL (ref 32.0–36.0)
Neutro Abs: 813 cells/uL — ABNORMAL LOW (ref 1500–7800)

## 2022-11-09 ENCOUNTER — Other Ambulatory Visit: Payer: Self-pay | Admitting: Physician Assistant

## 2022-11-09 LAB — COMPLETE METABOLIC PANEL WITH GFR
ALT: 16 U/L (ref 6–29)
BUN: 14 mg/dL (ref 7–25)
Globulin: 2.4 g/dL (calc) (ref 1.9–3.7)
Glucose, Bld: 72 mg/dL (ref 65–99)
Potassium: 4 mmol/L (ref 3.5–5.3)
eGFR: 99 mL/min/{1.73_m2} (ref >=60)

## 2022-11-09 LAB — CBC WITH DIFFERENTIAL/PLATELET
Absolute Monocytes: 476 cells/uL (ref 200–950)
Eosinophils Absolute: 109 cells/uL (ref 15–500)
Eosinophils Relative: 3.2 %
Lymphs Abs: 1952 cells/uL (ref 850–3900)
MCH: 28.1 pg (ref 27.0–33.0)
MCV: 87.7 fL (ref 80.0–100.0)
Monocytes Relative: 14 %
Neutrophils Relative %: 23.9 %
Platelets: 208 10*3/uL (ref 140–400)
RBC: 3.59 10*6/uL — ABNORMAL LOW (ref 3.80–5.10)

## 2022-11-10 LAB — COMPLETE METABOLIC PANEL WITH GFR
AG Ratio: 1.5 (calc) (ref 1.0–2.5)
AST: 26 U/L (ref 10–35)
Albumin: 3.7 g/dL (ref 3.6–5.1)
Alkaline phosphatase (APISO): 68 U/L (ref 37–153)
CO2: 26 mmol/L (ref 20–32)
Calcium: 8.7 mg/dL (ref 8.6–10.4)
Chloride: 107 mmol/L (ref 98–110)
Creat: 0.6 mg/dL (ref 0.50–1.05)
Sodium: 139 mmol/L (ref 135–146)
Total Bilirubin: 0.4 mg/dL (ref 0.2–1.2)
Total Protein: 6.1 g/dL (ref 6.1–8.1)

## 2022-11-10 LAB — QUANTIFERON-TB GOLD PLUS
Mitogen-NIL: 8.9 IU/mL
NIL: 0.09 IU/mL
QuantiFERON-TB Gold Plus: NEGATIVE
TB1-NIL: 0 IU/mL
TB2-NIL: 0 IU/mL

## 2022-11-10 LAB — LIPID PANEL
Cholesterol: 171 mg/dL (ref <200)
HDL: 52 mg/dL (ref >=50)
LDL Cholesterol (Calc): 99 mg/dL (calc)
Non-HDL Cholesterol (Calc): 119 mg/dL (calc) (ref <130)
Total CHOL/HDL Ratio: 3.3 (calc) (ref <5.0)
Triglycerides: 100 mg/dL (ref <150)

## 2022-11-10 LAB — CBC WITH DIFFERENTIAL/PLATELET
Basophils Absolute: 51 cells/uL (ref 0–200)
HCT: 31.5 % — ABNORMAL LOW (ref 35.0–45.0)
Hemoglobin: 10.1 g/dL — ABNORMAL LOW (ref 11.7–15.5)
MPV: 11.5 fL (ref 7.5–12.5)
RDW: 13.8 % (ref 11.0–15.0)
Total Lymphocyte: 57.4 %
WBC: 3.4 10*3/uL — ABNORMAL LOW (ref 3.8–10.8)

## 2022-11-10 NOTE — Progress Notes (Signed)
CMP is normal, LDL is normal, white cell count is low at 3.4 and stable hemoglobin is low at 10.1 and stable neutrophil count was low due to immunosuppression.  Please have patient repeat CBC in 1 month.  She should take multivitamin with iron.

## 2022-11-11 ENCOUNTER — Telehealth: Payer: Self-pay | Admitting: *Deleted

## 2022-11-11 DIAGNOSIS — Z79899 Other long term (current) drug therapy: Secondary | ICD-10-CM

## 2022-11-11 NOTE — Telephone Encounter (Signed)
-----   Message from Pollyann Savoy, MD sent at 11/10/2022  8:46 PM EDT ----- CMP is normal, LDL is normal, white cell count is low at 3.4 and stable hemoglobin is low at 10.1 and stable neutrophil count was low due to immunosuppression.  Please have patient repeat CBC in 1 month.  She should take multivitamin with iron.

## 2022-11-11 NOTE — Telephone Encounter (Signed)
Last Fill: 08/12/2022  Labs: 11/08/2022 CMP is normal, LDL is normal, white cell count is low at 3.4 and stable hemoglobin is low at 10.1 and stable neutrophil count was low due to immunosuppression.   Next Visit: 03/19/2023  Last Visit: 10/17/2022  DX: Seropositive rheumatoid arthritis   Current Dose per office note 10/17/2022: arava 20 mg 1 tablet daily   Okay to refill Arava ?

## 2022-11-25 ENCOUNTER — Other Ambulatory Visit: Payer: Self-pay | Admitting: Rheumatology

## 2022-11-25 DIAGNOSIS — Z79899 Other long term (current) drug therapy: Secondary | ICD-10-CM

## 2022-11-25 DIAGNOSIS — M059 Rheumatoid arthritis with rheumatoid factor, unspecified: Secondary | ICD-10-CM

## 2022-11-25 NOTE — Telephone Encounter (Signed)
Last Fill: 10/18/2022  Labs: 11/08/2022  CMP is normal, LDL is normal, white cell count is low at 3.4 and stable hemoglobin is low at 10.1 and stable neutrophil count was low due to immunosuppression.  Please have patient repeat CBC in 1 month.  She should take multivitamin with iron.   TB Gold: 11/08/2022   NEGATIVE   Next Visit: 03/19/2023  Last Visit: 10/17/2022  [id number] rheumatoid arthritis   Current Dose per office note 10/17/2022 : Rinvoq 15 mg 1 tablet by mouth daily   Okay to refill Rinvoq?

## 2023-01-01 ENCOUNTER — Telehealth: Payer: Self-pay

## 2023-01-01 NOTE — Telephone Encounter (Signed)
Patient and patient's daughter contacted the office to inquire when the patient's first visit at the office was. Advised the patient the first office visit that I can see was 10/09/2021. Patient verbalized understanding.

## 2023-02-07 ENCOUNTER — Other Ambulatory Visit: Payer: Self-pay | Admitting: Physician Assistant

## 2023-02-07 DIAGNOSIS — Z79899 Other long term (current) drug therapy: Secondary | ICD-10-CM

## 2023-02-07 NOTE — Telephone Encounter (Signed)
Last Fill: 11/11/2022  Labs: 11/08/2022 CMP is normal, LDL is normal, white cell count is low at 3.4 and stable hemoglobin is low at 10.1 and stable neutrophil count was low due to immunosuppression.   Next Visit: 03/19/2023  Last Visit: 10/17/2022  DX: Seropositive rheumatoid arthritis   Current Dose per office note 10/17/2022: arava 20 mg 1 tablet daily   Left message to advise patient she is due to update labs  Okay to refill Arava ?

## 2023-02-12 ENCOUNTER — Telehealth: Payer: Self-pay | Admitting: Pharmacist

## 2023-02-12 NOTE — Telephone Encounter (Signed)
PA form for Rinvoq PA renewal completed and faxed to [facility]  Phone: [phone] Fax: [phone]  Chesley Mires, PharmD, MPH, BCPS, CPP Clinical Pharmacist (Rheumatology and Pulmonology)

## 2023-02-12 NOTE — Telephone Encounter (Signed)
Received notification from  [facility]  regarding a prior authorization for [facility]. Per letter - an exception has already been granted  Patient must continue to fill through [facility]: [phone]  Chesley Mires, PharmD, MPH, BCPS, CPP Clinical Pharmacist (Rheumatology and Pulmonology)

## 2023-02-12 NOTE — Telephone Encounter (Signed)
Submitted a Prior Authorization renewal request to [facility] for RINVOQ via CoverMyMeds. Will update once we receive a response.  Key: [id number] Per automated response: [facility] is not able to process this request through ePA, please contact the plan at [phone] or fax in request to [phone].   Will need to submit PA via [facility] PA form  Chesley Mires, PharmD, MPH, BCPS, CPP Clinical Pharmacist (Rheumatology and Pulmonology)

## 2023-02-14 ENCOUNTER — Encounter: Payer: Self-pay | Admitting: Family Medicine

## 2023-02-14 ENCOUNTER — Ambulatory Visit (INDEPENDENT_AMBULATORY_CARE_PROVIDER_SITE_OTHER): Payer: No Typology Code available for payment source | Admitting: Family Medicine

## 2023-02-14 VITALS — BP 130/84 | HR 67 | Temp 97.6°F | Ht 62.0 in | Wt 163.0 lb

## 2023-02-14 DIAGNOSIS — H9319 Tinnitus, unspecified ear: Secondary | ICD-10-CM

## 2023-02-14 DIAGNOSIS — N950 Postmenopausal bleeding: Secondary | ICD-10-CM | POA: Diagnosis not present

## 2023-02-14 NOTE — Patient Instructions (Signed)
I have referred you to an ear nose and throat specialist and an OB/GYN.  They will both call you to schedule visits.  If you have not heard from anyone by next Friday, let us know.  If you develop headache, dizziness or any other symptoms along with the ringing in your ear, you should be seen again.

## 2023-02-14 NOTE — Progress Notes (Unsigned)
Subjective:  Melanie Garrett is a 67 y.o. female who presents for a 4 wk hx of intermittent right ear tinnitus, non pulsatile. No ear pain, fullness, or decreased hearing. No dizziness or headache. Denies fever, chills, URI symptoms.   She also c/o intermittent vaginal bleeding. No abdominal pain, N/V/D. Denies urinary symptoms.    ROS as in subjective.   Objective: Vitals:   02/14/23 1533  BP: 130/84  Pulse: 67  Temp: 97.6 F (36.4 C)  SpO2: 98%    General appearance: Alert, WD/WN, no distress                             Skin: warm, no rash                           Head: no sinus tenderness. No temporal artery fullness or TTP                            Eyes: conjunctiva normal, corneas clear, PERRLA                            Ears: pearly TMs, external ear canals normal                          Nose: septum midline, turbinates swollen, with erythema. No erythema.              Mouth/throat: MMM, tongue normal, mild pharyngeal erythema                           Neck: supple, no adenopathy, no thyromegaly, nontender. No carotid bruit.                           Heart: RRR                         Lungs: CTA bilaterally, no wheezes, rales, or rhonchi      Assessment: Postmenopausal bleeding - Plan: Ambulatory referral to Obstetrics / Gynecology  Unilateral subjective nonpulsatile tinnitus without hearing loss, otoscopic finding, neurologic deficit, or head trauma - Plan: Ambulatory referral to ENT   Plan: Well appearing. Referral to ENT due to unilateral tinnitus without red flag symptoms but unexplained. Referral to OB/GYN for post menopausal bleeding.

## 2023-02-20 ENCOUNTER — Other Ambulatory Visit: Payer: Self-pay | Admitting: Physician Assistant

## 2023-02-20 DIAGNOSIS — M059 Rheumatoid arthritis with rheumatoid factor, unspecified: Secondary | ICD-10-CM

## 2023-02-20 DIAGNOSIS — Z79899 Other long term (current) drug therapy: Secondary | ICD-10-CM

## 2023-02-20 NOTE — Telephone Encounter (Signed)
Last Fill: 11/25/2022  Labs: 11/08/2022 CMP is normal, LDL is normal, white cell count is low at 3.4 and stable hemoglobin is low at 10.1 and stable neutrophil count was low due to immunosuppression   TB Gold: 11/08/2022 Neg    Next Visit: 03/19/2023  Last Visit: 10/17/2022  [id number] rheumatoid arthritis   Current Dose per office note 10/17/2022: Rinvoq 15 mg 1 tablet by mouth daily   Patient advised she is due to update labs. Patient will come to the office tomorrow to update.   Okay to refill Rinvoq?

## 2023-02-21 ENCOUNTER — Other Ambulatory Visit: Payer: Self-pay | Admitting: *Deleted

## 2023-02-21 DIAGNOSIS — Z79899 Other long term (current) drug therapy: Secondary | ICD-10-CM

## 2023-02-22 LAB — COMPLETE METABOLIC PANEL WITH GFR
AG Ratio: 1.4 (calc) (ref 1.0–2.5)
ALT: 15 U/L (ref 6–29)
AST: 25 U/L (ref 10–35)
Albumin: 3.6 g/dL (ref 3.6–5.1)
Alkaline phosphatase (APISO): 63 U/L (ref 37–153)
BUN: 16 mg/dL (ref 7–25)
CO2: 27 mmol/L (ref 20–32)
Calcium: 9.2 mg/dL (ref 8.6–10.4)
Chloride: 109 mmol/L (ref 98–110)
Creat: 0.64 mg/dL (ref 0.50–1.05)
Globulin: 2.6 g/dL (ref 1.9–3.7)
Glucose, Bld: 67 mg/dL (ref 65–99)
Potassium: 3.7 mmol/L (ref 3.5–5.3)
Sodium: 142 mmol/L (ref 135–146)
Total Bilirubin: 0.5 mg/dL (ref 0.2–1.2)
Total Protein: 6.2 g/dL (ref 6.1–8.1)
eGFR: 97 mL/min/{1.73_m2} (ref >=60)

## 2023-02-22 LAB — CBC WITH DIFFERENTIAL/PLATELET
Absolute Monocytes: 542 {cells}/uL (ref 200–950)
Basophils Absolute: 60 {cells}/uL (ref 0–200)
Basophils Relative: 1.4 %
Eosinophils Absolute: 142 {cells}/uL (ref 15–500)
Eosinophils Relative: 3.3 %
HCT: 32.1 % — ABNORMAL LOW (ref 35.0–45.0)
Hemoglobin: 10.3 g/dL — ABNORMAL LOW (ref 11.7–15.5)
Lymphs Abs: 1952 cells/uL (ref 850–3900)
MCH: 28.4 pg (ref 27.0–33.0)
MCHC: 32.1 g/dL (ref 32.0–36.0)
MCV: 88.4 fL (ref 80.0–100.0)
MPV: 11.4 fL (ref 7.5–12.5)
Monocytes Relative: 12.6 %
Neutro Abs: 1604 {cells}/uL (ref 1500–7800)
Neutrophils Relative %: 37.3 %
Platelets: 225 10*3/uL (ref 140–400)
RBC: 3.63 10*6/uL — ABNORMAL LOW (ref 3.80–5.10)
RDW: 13.2 % (ref 11.0–15.0)
Total Lymphocyte: 45.4 %
WBC: 4.3 10*3/uL (ref 3.8–10.8)

## 2023-02-22 NOTE — Progress Notes (Signed)
CMP is normal.  Hemoglobin is low and stable.  Please forward results to her PCP.  Patient should take multivitamin with iron.

## 2023-03-05 NOTE — Progress Notes (Deleted)
Office Visit Note  Patient: Melanie Garrett             Date of Birth: August 06, 1955           MRN: [id number]             PCP: Etta Grandchild, MD Referring: Etta Grandchild, MD Visit Date: 03/19/2023 Occupation: @GUAROCC @  Subjective:  No chief complaint on file.   History of Present Illness: Melanie Garrett is a 67 y.o. female ***     Activities of Daily Living:  Patient reports morning stiffness for *** {minute/hour:19697}.   Patient {ACTIONS;DENIES/REPORTS:21021675::"Denies"} nocturnal pain.  Difficulty dressing/grooming: {ACTIONS;DENIES/REPORTS:21021675::"Denies"} Difficulty climbing stairs: {ACTIONS;DENIES/REPORTS:21021675::"Denies"} Difficulty getting out of chair: {ACTIONS;DENIES/REPORTS:21021675::"Denies"} Difficulty using hands for taps, buttons, cutlery, and/or writing: {ACTIONS;DENIES/REPORTS:21021675::"Denies"}  No Rheumatology ROS completed.   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   Routine general medical examination at a health care facility 09/02/2016   Eczema, dyshidrotic 02/02/2015    Past Medical History:  Diagnosis Date   Osteoarthritis 02/09/2020   RA (rheumatoid arthritis) (HCC) 04/05/2021    Family History  Problem Relation Age of Onset   Arthritis Mother    Lupus Sister    Lupus Brother    Seizures Brother    Hypertension Daughter    Arthritis Daughter    Hypertension Daughter    Arthritis Daughter     Alcohol abuse Neg Hx    Cancer Neg Hx    Diabetes Neg Hx    Drug abuse Neg Hx    Early death Neg Hx    Heart disease Neg Hx    Stroke Neg Hx    Past Surgical History:  Procedure Laterality Date   KNEE SURGERY Left    acl repair   Social History   Social History Narrative   Not on file   Immunization History  Administered Date(s) Administered   Influenza,inj,Quad PF,6+ Mos 06/29/2018, 03/08/2021   Influenza-Unspecified 02/23/2015, 03/24/2016, 03/24/2017   PNEUMOCOCCAL CONJUGATE-20 07/12/2021   Td 04/27/2010   Tdap 05/04/2013   Zoster Recombinant(Shingrix) 08/31/2022, 11/26/2022     Objective: Vital Signs: There were no vitals taken for this visit.   Physical Exam   Musculoskeletal Exam: ***  CDAI Exam: CDAI Score: -- Patient Global: --; Provider Global: -- Swollen: --; Tender: -- Joint Exam 03/19/2023   No joint exam has been documented for this visit   There is currently no information documented on the homunculus. Go to the Rheumatology activity and complete the homunculus joint exam.  Investigation: No additional findings.  Imaging: No results found.  Recent Labs: Lab Results  Component Value Date   WBC 4.3 02/21/2023   HGB 10.3 (L) 02/21/2023   PLT 225 02/21/2023   NA 142 02/21/2023   K 3.7 02/21/2023   CL 109 02/21/2023   CO2 27 02/21/2023   GLUCOSE 67 02/21/2023   BUN 16 02/21/2023   CREATININE 0.64 02/21/2023   BILITOT 0.5 02/21/2023   ALKPHOS 66 03/08/2021   AST  25 02/21/2023   ALT 15 02/21/2023   PROT 6.2 02/21/2023   ALBUMIN 3.9 03/08/2021   CALCIUM 9.2 02/21/2023   QFTBGOLDPLUS NEGATIVE 11/08/2022    Speciality Comments: dxd 08/21 [facility] MTX, Arava-inadequate response.  Intermittent use of Orencia due to positive TB Gold, (stopped on 03/07/22), Actemra started 03/14/22 Rinvoq 08/21/22 rifampin 08/2022X 4 months  Procedures:  No procedures performed Allergies: Patient has no known allergies.   Assessment / Plan:     Visit  Diagnoses: Seropositive rheumatoid arthritis (HCC)  High risk medication use  TB lung, latent  Primary osteoarthritis of both hands  Primary osteoarthritis of both knees  Primary osteoarthritis of both feet  Pain in left ankle and joints of left foot  Dyslipidemia  Urticaria, idiopathic  Intractable migraine without aura and without status migrainosus  Eczema, dyshidrotic  Family history of systemic lupus erythematosus, mother and brother  Orders: No orders of the defined types were placed in this encounter.  No orders of the defined types were placed in this encounter.   Face-to-face time spent with patient was *** minutes. Greater than 50% of time was spent in counseling and coordination of care.  Follow-Up Instructions: No follow-ups on file.   Gearldine Bienenstock, PA-C  Note - This record has been created using Dragon software.  Chart creation errors have been sought, but may not always  have been located. Such creation errors do not reflect on  the standard of medical care.

## 2023-03-17 NOTE — Progress Notes (Unsigned)
Office Visit Note  Patient: Melanie Garrett             Date of Birth: October 22, 1955           MRN: [id number]             PCP: Etta Grandchild, MD Referring: Etta Grandchild, MD Visit Date: 03/18/2023 Occupation: @GUAROCC @  Subjective:  Medication monitoring  History of Present Illness: Melanie Garrett is a 67 y.o. female with history of seropositive rheumatoid arthritis.  Patient remains on Rinvoq 15 mg 1 tablet by mouth daily and arava 20 mg 1 tablet daily.  Rinvoq was initiated on 08/21/2022.  She has been tolerating combination therapy without any side effects.  Patient states that she has noticed almost 100% improvement in her symptoms on combination therapy.  She continues to have some residual swelling in the left ankle but denies any tenderness or difficulty with ambulation.  She has not had any morning stiffness or nocturnal pain.  She denies any difficulties with ADLs. Patient requested to have a handicap placard renewal which helps her from having to walk prolonged distances.     Activities of Daily Living:  Patient reports morning stiffness for 0 minutes.   Patient Denies nocturnal pain.  Difficulty dressing/grooming: Denies Difficulty climbing stairs: Denies Difficulty getting out of chair: Denies Difficulty using hands for taps, buttons, cutlery, and/or writing: Denies  Review of Systems  Constitutional:  Negative for fatigue.  HENT:  Positive for ear ringing. Negative for mouth sores and mouth dryness.   Eyes:  Negative for dryness.  Respiratory:  Negative for shortness of breath.   Cardiovascular:  Positive for swelling in legs/feet. Negative for chest pain and palpitations.  Gastrointestinal:  Negative for blood in stool, constipation and diarrhea.  Endocrine: Negative for increased urination.  Genitourinary:  Negative for involuntary urination.  Musculoskeletal:  Positive for muscle tenderness. Negative for joint pain, gait problem, joint pain, joint swelling,  myalgias, muscle weakness, morning stiffness and myalgias.  Skin:  Negative for color change, rash, hair loss and sensitivity to sunlight.  Allergic/Immunologic: Negative for susceptible to infections.  Neurological:  Negative for dizziness and headaches.  Hematological:  Negative for swollen glands.  Psychiatric/Behavioral:  Negative for depressed mood and sleep disturbance. The patient is not nervous/anxious.     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   Routine general medical examination at a health care facility 09/02/2016   Eczema, dyshidrotic 02/02/2015    Past Medical History:  Diagnosis Date   Osteoarthritis 02/09/2020   RA (rheumatoid arthritis) (HCC) 04/05/2021    Family History  Problem Relation Age of Onset   Arthritis Mother    Lupus Sister    Lupus Brother    Seizures Brother    Hypertension Daughter    Arthritis Daughter    Hypertension Daughter    Arthritis Daughter    Alcohol abuse Neg Hx    Cancer Neg Hx    Diabetes Neg Hx  Drug abuse Neg Hx    Early death Neg Hx    Heart disease Neg Hx    Stroke Neg Hx    Past Surgical History:  Procedure Laterality Date   KNEE SURGERY Left    acl repair   Social History   Social History Narrative   Not on file   Immunization History  Administered Date(s) Administered   Influenza,inj,Quad PF,6+ Mos 06/29/2018, 03/08/2021   Influenza-Unspecified 02/23/2015, 03/24/2016,  03/24/2017   PNEUMOCOCCAL CONJUGATE-20 07/12/2021   Td 04/27/2010   Tdap 05/04/2013   Zoster Recombinant(Shingrix) 08/31/2022, 11/26/2022     Objective: Vital Signs: BP (!) 163/92 (BP Location: Left Arm, Patient Position: Sitting, Cuff Size: Normal)   Pulse 62   Resp 15   Ht 5\' 2"  (1.575 m)   Wt 165 lb (74.8 kg)   BMI 30.18 kg/m    Physical Exam Vitals and nursing note reviewed.  Constitutional:      Appearance: She is well-developed.  HENT:     Head: Normocephalic and atraumatic.  Eyes:     Conjunctiva/sclera: Conjunctivae normal.  Cardiovascular:     Rate and Rhythm: Normal rate and regular rhythm.     Heart sounds: Normal heart sounds.  Pulmonary:     Effort: Pulmonary effort is normal.     Breath sounds: Normal breath sounds.  Abdominal:     General: Bowel sounds are normal.     Palpations: Abdomen is soft.  Musculoskeletal:     Cervical back: Normal range of motion.  Lymphadenopathy:     Cervical: No cervical adenopathy.  Skin:    General: Skin is warm and dry.     Capillary Refill: Capillary refill takes less than 2 seconds.  Neurological:     Mental Status: She is alert and oriented to person, place, and time.  Psychiatric:        Behavior: Behavior normal.      Musculoskeletal Exam: C-spine has good range of motion.  Sternoclavicular joint thickening noted bilaterally.  Shoulder joints, elbow joints, wrist joints, MCPs, PIPs, DIPs have good range of motion with no synovitis.  Synovial thickening over the right fourth MCP and PIP joint.  Hip joints have good range of motion with no groin pain.  Knee joints have good range of motion with no warmth or effusion.  No obvious thickening over the left ankle but no tenderness or warmth noted.  Right ankle has good range of motion with no tenderness or synovitis.  CDAI Exam: CDAI Score: -- Patient Global: 0 / 100; Provider Global: 0 / 100 Swollen: --; Tender: -- Joint Exam 03/18/2023   No joint exam has been  documented for this visit   There is currently no information documented on the homunculus. Go to the Rheumatology activity and complete the homunculus joint exam.  Investigation: No additional findings.  Imaging: No results found.  Recent Labs: Lab Results  Component Value Date   WBC 4.3 02/21/2023   HGB 10.3 (L) 02/21/2023   PLT 225 02/21/2023   NA 142 02/21/2023   K 3.7 02/21/2023   CL 109 02/21/2023   CO2 27 02/21/2023   GLUCOSE 67 02/21/2023   BUN 16 02/21/2023   CREATININE 0.64 02/21/2023   BILITOT 0.5 02/21/2023   ALKPHOS 66 03/08/2021   AST 25 02/21/2023   ALT 15 02/21/2023   PROT 6.2 02/21/2023   ALBUMIN 3.9 03/08/2021   CALCIUM 9.2 02/21/2023   QFTBGOLDPLUS NEGATIVE 11/08/2022    Speciality Comments: dxd 08/21 [facility]  MTX, Arava-inadequate response.  Intermittent use of Orencia due to positive TB Gold, (stopped on 03/07/22), Actemra started 03/14/22 Rinvoq 08/21/22 rifampin 08/2022X 4 months  Procedures:  No procedures performed Allergies: Patient has no known allergies.   Assessment / Plan:     Visit Diagnoses: Seropositive rheumatoid arthritis (HCC) - +RF,+CCP Ab - positive ANA, elevated sedimentation rate, severe synovitis.  DXd 08/21 at [facility]: She has no synovitis on examination today.  She has noticed almost 100% improvement in her symptoms since initiating rinvoq on 08/21/2022.  She has been taking Rinvoq 15 mg 1 tablet by mouth daily and Arava 20 mg 1 tablet by mouth daily.  She is tolerating combination therapy without any side effects.  She has not had any recent gaps in therapy or recurrent infections.  She has not been experiencing any morning stiffness or nocturnal pain.  She has had less difficulty ambulating due to having less frequent flares involving the left ankle.  Patient will remain on Rinvoq and Arava as combination therapy.  She is vies notify us if she develops signs or symptoms of a flare.  She will follow-up in the office in 5 months or  sooner if needed.  High risk medication use - Rinvoq 15 mg 1 tablet by mouth daily and arava 20 mg 1 tablet daily.  Inadequate response to actemra and orencia. CBC and CMP updated on 02/21/23.  Her next lab work will be due in November and every 3 months to monitor for drug toxicity.  Standing orders for CBC and CMP remain in place. TB gold negative on 11/08/22.  No recent or recurrent infections.  Discussed the importance of holding rinvoq and arava if she develops signs or symptoms of an infection and to resume once the infection has completely cleared.  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  CXR on 10/12/21 no active cardiopulmonary disease. TB gold negative on 11/08/2022.  Primary osteoarthritis of both hands: No synovitis noted on examination today.  Discussed the importance of joint protection and muscle strengthening.  Primary osteoarthritis of both knees: She has good range of motion of both knee joints on examination today.  No warmth or effusion noted.  She has occasional discomfort in the right knee joint if walking prolonged distances.  Patient was given paperwork to renew a handicap placard.  Primary osteoarthritis of both feet: Synovial thickening over the left ankle but no active synovitis noted today.  Pain in left ankle and joints of left foot: Synovial thickening of the left ankle noted.  No active synovitis noted today.  She has noticed a significant improvement in her symptoms since adding on rinvoq in February 2024.  Other medical conditions are listed as follows:  Urticaria, idiopathic  Intractable migraine without aura and without status migrainosus  Eczema, dyshidrotic  Family history of systemic lupus erythematosus, mother and brother  Dyslipidemia  Orders: No orders of the defined types were placed in this encounter.  No orders of the defined types were placed in this encounter.    Follow-Up  Instructions: Return in about 5 months (around 08/18/2023) for Rheumatoid arthritis.   Gearldine Bienenstock, PA-C  Note - This record has been created using Dragon software.  Chart creation errors have been sought, but may not always  have been located. Such creation errors do not reflect on  the standard of medical care.

## 2023-03-18 ENCOUNTER — Encounter: Payer: Self-pay | Admitting: Physician Assistant

## 2023-03-18 ENCOUNTER — Ambulatory Visit: Payer: No Typology Code available for payment source | Attending: Physician Assistant | Admitting: Physician Assistant

## 2023-03-18 VITALS — BP 160/93 | HR 57 | Resp 15 | Ht 62.0 in | Wt 165.0 lb

## 2023-03-18 DIAGNOSIS — M059 Rheumatoid arthritis with rheumatoid factor, unspecified: Secondary | ICD-10-CM

## 2023-03-18 DIAGNOSIS — E785 Hyperlipidemia, unspecified: Secondary | ICD-10-CM | POA: Diagnosis not present

## 2023-03-18 DIAGNOSIS — M19042 Primary osteoarthritis, left hand: Secondary | ICD-10-CM

## 2023-03-18 DIAGNOSIS — M19041 Primary osteoarthritis, right hand: Secondary | ICD-10-CM | POA: Diagnosis not present

## 2023-03-18 DIAGNOSIS — Z8269 Family history of other diseases of the musculoskeletal system and connective tissue: Secondary | ICD-10-CM | POA: Diagnosis not present

## 2023-03-18 DIAGNOSIS — Z227 Latent tuberculosis: Secondary | ICD-10-CM | POA: Diagnosis not present

## 2023-03-18 DIAGNOSIS — L501 Idiopathic urticaria: Secondary | ICD-10-CM

## 2023-03-18 DIAGNOSIS — M19071 Primary osteoarthritis, right ankle and foot: Secondary | ICD-10-CM

## 2023-03-18 DIAGNOSIS — M170 Bilateral primary osteoarthritis of knee: Secondary | ICD-10-CM

## 2023-03-18 DIAGNOSIS — L301 Dyshidrosis [pompholyx]: Secondary | ICD-10-CM | POA: Diagnosis not present

## 2023-03-18 DIAGNOSIS — Z79899 Other long term (current) drug therapy: Secondary | ICD-10-CM | POA: Diagnosis not present

## 2023-03-18 DIAGNOSIS — M19072 Primary osteoarthritis, left ankle and foot: Secondary | ICD-10-CM

## 2023-03-18 DIAGNOSIS — G43019 Migraine without aura, intractable, without status migrainosus: Secondary | ICD-10-CM | POA: Diagnosis not present

## 2023-03-18 DIAGNOSIS — M25572 Pain in left ankle and joints of left foot: Secondary | ICD-10-CM

## 2023-03-18 NOTE — Patient Instructions (Signed)
Standing Labs We placed an order today for your standing lab work.   Please have your standing labs drawn in November and every 3 months   Please have your labs drawn 2 weeks prior to your appointment so that the provider can discuss your lab results at your appointment, if possible.  Please note that you may see your imaging and lab results in MyChart before we have reviewed them. We will contact you once all results are reviewed. Please allow our office up to 72 hours to thoroughly review all of the results before contacting the office for clarification of your results.  WALK-IN LAB HOURS  Monday through Thursday from 8:00 am -12:30 pm and 1:00 pm-5:00 pm and Friday from 8:00 am-12:00 pm.  Patients with office visits requiring labs will be seen before walk-in labs.  You may encounter longer than normal wait times. Please allow additional time. Wait times may be shorter on  Monday and Thursday afternoons.  We do not book appointments for walk-in labs. We appreciate your patience and understanding with our staff.   Labs are drawn by [facility]. Please bring your co-pay at the time of your lab draw.  You may receive a bill from [facility] for your lab work.  Please note if you are on Hydroxychloroquine and and an order has been placed for a Hydroxychloroquine level,  you will need to have it drawn 4 hours or more after your last dose.  If you wish to have your labs drawn at another location, please call the office 24 hours in advance so we can fax the orders.  The office is located at [address], [address], [place], [place] [address]   If you have any questions regarding directions or hours of operation,  please call [phone].   As a reminder, please drink plenty of water prior to coming for your lab work. Thanks!

## 2023-03-19 ENCOUNTER — Telehealth: Payer: Self-pay | Admitting: Internal Medicine

## 2023-03-19 ENCOUNTER — Ambulatory Visit: Payer: No Typology Code available for payment source | Admitting: Physician Assistant

## 2023-03-19 ENCOUNTER — Other Ambulatory Visit: Payer: Self-pay | Admitting: Rheumatology

## 2023-03-19 DIAGNOSIS — M059 Rheumatoid arthritis with rheumatoid factor, unspecified: Secondary | ICD-10-CM

## 2023-03-19 DIAGNOSIS — Z8269 Family history of other diseases of the musculoskeletal system and connective tissue: Secondary | ICD-10-CM

## 2023-03-19 DIAGNOSIS — E785 Hyperlipidemia, unspecified: Secondary | ICD-10-CM

## 2023-03-19 DIAGNOSIS — L301 Dyshidrosis [pompholyx]: Secondary | ICD-10-CM

## 2023-03-19 DIAGNOSIS — Z79899 Other long term (current) drug therapy: Secondary | ICD-10-CM

## 2023-03-19 DIAGNOSIS — G43019 Migraine without aura, intractable, without status migrainosus: Secondary | ICD-10-CM

## 2023-03-19 DIAGNOSIS — M25572 Pain in left ankle and joints of left foot: Secondary | ICD-10-CM

## 2023-03-19 DIAGNOSIS — M19071 Primary osteoarthritis, right ankle and foot: Secondary | ICD-10-CM

## 2023-03-19 DIAGNOSIS — Z227 Latent tuberculosis: Secondary | ICD-10-CM

## 2023-03-19 DIAGNOSIS — M19041 Primary osteoarthritis, right hand: Secondary | ICD-10-CM

## 2023-03-19 DIAGNOSIS — M170 Bilateral primary osteoarthritis of knee: Secondary | ICD-10-CM

## 2023-03-19 DIAGNOSIS — L501 Idiopathic urticaria: Secondary | ICD-10-CM

## 2023-03-19 NOTE — Telephone Encounter (Signed)
LM on voicemail of referral coordinator at [facility] for [facility] at [facility] to contact me as we placed this referral last month and are requesting an update as pt states they never received it.

## 2023-03-19 NOTE — Telephone Encounter (Signed)
Last Fill: 02/20/2023 (30 day supply)  Labs: 02/21/2023 CMP is normal.  Hemoglobin is low and stable.     TB Gold: 11/08/2022 Neg    Next Visit: 08/27/2023  Last Visit: 03/18/2023  [id number] rheumatoid arthritis   Current Dose per office note 03/18/2023: Rinvoq 15 mg 1 tablet by mouth daily   Okay to refill Rinvoq?

## 2023-03-19 NOTE — Telephone Encounter (Signed)
Patient had a referral placed to OB/GYN by Hetty Blend. The patient's daughter spoke with the office and they said they haven't received a referral. Patient would like to know if it can be re-sent. Best callback is [phone].

## 2023-03-26 ENCOUNTER — Telehealth (INDEPENDENT_AMBULATORY_CARE_PROVIDER_SITE_OTHER): Payer: Self-pay | Admitting: Otolaryngology

## 2023-03-26 ENCOUNTER — Other Ambulatory Visit: Payer: Self-pay

## 2023-03-26 ENCOUNTER — Telehealth: Payer: Self-pay | Admitting: Internal Medicine

## 2023-03-26 DIAGNOSIS — H9319 Tinnitus, unspecified ear: Secondary | ICD-10-CM

## 2023-03-26 NOTE — Progress Notes (Signed)
Per patient request

## 2023-03-26 NOTE — Telephone Encounter (Signed)
-  Referral to audiology placed °

## 2023-03-26 NOTE — Telephone Encounter (Signed)
Erica from [employer] [facility] [employer] called on behalf of the patient to request that Dr. Irene Pap send a referral to [facility] since she requested patient see them.  Patient has an appt tomorrow and if they do not receive referral, appt will need to be rescheduled.Please advise.  She can be reached at [phone]

## 2023-03-26 NOTE — Telephone Encounter (Signed)
Pt is scheduled for the ENT and the GYN. Pt needs a referral to audiology (however [facility] is the office that ref her to Audiology. I have called their office asking if they can place the referral. She did send a message back to the provider to see if this could be be done.   I have sent Dahlia Client asking her if she can place the referral for the pt so her appt doesn't get cancelled tomorrow.   I have made Pam (pt's daughter and pt aware of this information)     Alcario Drought

## 2023-03-26 NOTE — Telephone Encounter (Signed)
Pt daughter called stating the referrals to OBGYN and ENT both places stating they don't have the referrals. Please advise ad the referrals was sent Augt 23rd.

## 2023-03-27 ENCOUNTER — Ambulatory Visit: Payer: No Typology Code available for payment source | Attending: Family Medicine | Admitting: Audiologist

## 2023-03-27 DIAGNOSIS — H9311 Tinnitus, right ear: Secondary | ICD-10-CM | POA: Diagnosis not present

## 2023-03-27 DIAGNOSIS — H903 Sensorineural hearing loss, bilateral: Secondary | ICD-10-CM | POA: Insufficient documentation

## 2023-03-27 NOTE — Telephone Encounter (Signed)
I see where an order has been placed by another provider

## 2023-03-27 NOTE — Procedures (Signed)
[facility] [address] [place], [place]  [address] [phone]  AUDIOLOGICAL  EVALUATION  NAME: Melanie Garrett     DOB:   April 30, 1956      MRN: [id number]                                                                                     DATE: 03/27/2023     REFERENT: Etta Grandchild, MD STATUS: Outpatient DIAGNOSIS: Sensorineural Hearing Loss Bilateral     History: Melanie Garrett was seen for an audiological evaluation due to intermittent tinnitus in the right ear. A few times a week she will have a couple of seconds of ringing in the right ear. There are no other symptoms or pain or pressure. She denies difficulty hearing. The ringing is not bothersome since it lasts only a few seconds. She denies any significant hazardous noise or medical history. She last heard the ringing this morning.  Evaluation:  Otoscopy showed a clear view of the tympanic membranes, bilaterally Tympanometry results were consistent with normal middle ear function, bilaterally   Audiometric testing was completed using Conventional Audiometry techniques with TDH headphones. Test results are consistent with normal hearing sloping to moderate sensorineural hearing loss bilaterally. Speech Recognition Thresholds were obtained at 30dB HL in the right ear and at 20dB HL in the left ear. Word Recognition Testing was completed at 40dB SL and Melanie Garrett scored 80% in the right ear and 84% in the left ear.    Results:  The test results were reviewed with Melanie Garrett and her daughter. Melanie Garrett has a moderate high frequency sensorineural hearing loss. Melanie Garrett likely has intermittent ringing due to this hearing loss. She is not yet a hearing aid candidate due to normal hearing at 250-4kHz. Melanie Garrett had no questions.   Recommendations: 1.   Monitor hearing with annual audiometric testing.    28 minutes spent testing and counseling on results.   If you have any questions please feel free to contact me at  [phone].  Ammie Ferrier Audiologist, Au.D., CCC-A 03/27/2023  2:19 PM  Cc: Etta Grandchild, MD

## 2023-03-27 NOTE — Telephone Encounter (Signed)
Thank you.  There was a bit of confusion about where the referral should come from.  Everything has been worked out and patient is scheduled to see Audiology today as planned before seeing Dr. Allena Katz on 04/08/23.

## 2023-03-31 ENCOUNTER — Institutional Professional Consult (permissible substitution) (INDEPENDENT_AMBULATORY_CARE_PROVIDER_SITE_OTHER): Payer: No Typology Code available for payment source | Admitting: Otolaryngology

## 2023-04-08 ENCOUNTER — Ambulatory Visit (INDEPENDENT_AMBULATORY_CARE_PROVIDER_SITE_OTHER): Payer: No Typology Code available for payment source | Admitting: Otolaryngology

## 2023-04-08 ENCOUNTER — Encounter (INDEPENDENT_AMBULATORY_CARE_PROVIDER_SITE_OTHER): Payer: Self-pay

## 2023-04-08 VITALS — Ht 62.0 in | Wt 165.0 lb

## 2023-04-08 DIAGNOSIS — H9311 Tinnitus, right ear: Secondary | ICD-10-CM | POA: Diagnosis not present

## 2023-04-08 DIAGNOSIS — H903 Sensorineural hearing loss, bilateral: Secondary | ICD-10-CM | POA: Diagnosis not present

## 2023-04-08 NOTE — Progress Notes (Signed)
Dear Dr. Yetta Garrett, Here is my assessment for our mutual patient, Melanie Garrett. Thank you for allowing me the opportunity to care for your patient. Please do not hesitate to contact me should you have any other questions. Sincerely, Dr. Jovita Garrett  Otolaryngology Clinic Note Referring provider: Dr. Yetta Garrett HPI:  Melanie Garrett is a 67 y.o. female kindly referred by Dr. 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 She saw Melanie Garrett, AuD for audio (see below). Her primary complaint was the tinnitus, and the HL was found incidentally. She reports that it is intermittent tinnitus in the right ear. Few times a week, couple of minutes. Couple of months. No subjective difficulty hearing, no pain/pressure/drainage/vertigo/popping/crackling/deep pain/autophony/medication use such as aspirin. It is not bothersome. She denies significant noise exposure, barotrauma, vestibular suppressants. Low pitched, just buzzing, not pulsatile.  No ear surgery or problems or infections in the past.   No prior CT or MRI.  PMHx: Rheumatoid arthritis (on rinvoq, arava), prior latent treated TB.   PMH/Meds/All/SocHx/FamHx/ROS:   Past Medical History:  Diagnosis Date   Osteoarthritis 02/09/2020   RA (rheumatoid arthritis) (HCC) 04/05/2021   Denies heart/lung/kidney issues, diabetes, previous diagnosis of cancer  Past Surgical History:  Procedure Laterality Date   KNEE SURGERY Left    acl repair   Denies history of head or neck surgery  Family History  Problem Relation Age of Onset   Arthritis Mother    Lupus Sister    Lupus Brother    Seizures Brother    Hypertension Daughter    Arthritis Daughter    Hypertension Daughter    Arthritis Daughter    Alcohol abuse Neg Hx    Cancer Neg Hx    Diabetes Neg Hx    Drug abuse Neg Hx    Early death Neg Hx    Heart disease Neg Hx    Stroke Neg Hx    No family history of bleeding disorders or difficulty with anesthesia  Social Connections:  Unknown (11/01/2021)   Received from [facility], [facility]   Social Network    Social Network: Not on file      Current Outpatient Medications:    Cyanocobalamin (VITAMIN B-12 PO), Take by mouth as needed., Disp: , Rfl:    leflunomide (ARAVA) 20 MG tablet, TAKE 1 TABLET BY MOUTH EVERY DAY, Disp: 90 tablet, Rfl: 0   RINVOQ 15 MG TB24, TAKE 1 TABLET BY MOUTH 1 TIME A DAY, Disp: 90 tablet, Rfl: 0   traMADol (ULTRAM) 50 MG tablet, Take 1 tablet (50 mg total) by mouth every 6 (six) hours as needed., Disp: 75 tablet, Rfl: 3   VITAMIN D PO, Take by mouth as needed., Disp: , Rfl:    folic acid (FOLVITE) 1 MG tablet, Take 1 mg by mouth daily. (Patient not taking: Reported on 04/08/2023), Disp: , Rfl:    potassium chloride SA (KLOR-CON) 20 MEQ tablet, Take 1 tablet (20 mEq total) by mouth daily. (Patient not taking: Reported on 03/18/2023), Disp: 90 tablet, Rfl: 0   Ubrogepant (UBRELVY) 100 MG TABS, Take 1 tablet by mouth daily as needed. (Patient not taking: Reported on 04/08/2023), Disp: 30 tablet, Rfl: 1    Physical Exam:   Ht 5\' 2"  (1.575 m)   Wt 165 lb (74.8 kg)   BMI 30.18 kg/m    Salient findings:  CN II-XII intact; no numbness preauricularly Bilateral EAC clear and TM intact with well pneumatized middle ear spaces; minimal myringosclerosis centrally on right Weber  512: midline Rinne 512: AC > BC b/l  Rine 1024: AC > BC b/l  Anterior rhinoscopy: Septum relatively midline; bilateral inferior turbinates without significant hypertrophy No lesions of oral cavity/oropharynx; dentition mostly intact No obviously palpable neck masses/lymphadenopathy/thyromegaly No respiratory distress or stridor  Independent Review of Additional Tests or Records:  03/27/2023 Audiogram was independently reviewed and interpreted by me and it reveals Bilateral sloping SNHL with borderline thresholds at lower frequency with single mid-freq with potentially small conductive component. 80% word  interpretation at 70dB R; type A tympanogram; 84% AD WRT at 60 dB; Type A tymp    SNHL= Sensorineural hearing loss   Procedures:  None  Impression & Plans:  Melanie Garrett is a 67 y.o. female with bilateral symmetric downsloping SNHL (predominant) with R unilateral non-pulsatile intermittent tinnitus, not bothersome  B/l SNHL - discussed options including R/B/A including amplification - she wished to defer Unilateral tinnitus - for 2 months; WRT not significant different, but given unilateral nature we will do a follow up audio summer 2025; if persistent tinnitus, plan for MRI (she wished to defer for now)  - f/u summer 2025 with audio  MDM:  Level 4: 99204 I have personally spent 45 minutes involved in face-to-face and non-face-to-face activities for this patient on the day of the visit.  Professional time spent includes the following activities, in addition to those noted in the documentation: preparing to see the patient (review of outside documentation and results - audio), performing a medically appropriate examination and/or evaluation, counseling and educating the patient/family/caregiver, ordering audiogram, referring and communicating with other healthcare professionals, documenting clinical information in the electronic or other health record, independently interpreting results and communicating results with the patient/family/caregiver (daughter).      Thank you for allowing me the opportunity to care for your patient. Please do not hesitate to contact me should you have any other questions.  Sincerely, Melanie Kussmaul, MD Otolarynoglogist (ENT), [facility] Specialist Phone: [phone] Fax: [phone]  04/08/2023, 1:18 PM

## 2023-05-08 ENCOUNTER — Other Ambulatory Visit: Payer: Self-pay | Admitting: Rheumatology

## 2023-05-08 NOTE — Telephone Encounter (Signed)
Last Fill: 02/07/2023  Labs: 02/21/2023 CMP is normal.  Hemoglobin is low and stable.   Next Visit: 08/27/2023  Last Visit: 03/18/2023  DX: Seropositive rheumatoid arthritis   Current Dose per office note 03/18/2023: arava 20 mg 1 tablet daily.   Okay to refill Arava ?

## 2023-05-14 ENCOUNTER — Encounter: Payer: Self-pay | Admitting: Obstetrics and Gynecology

## 2023-05-14 ENCOUNTER — Other Ambulatory Visit (HOSPITAL_COMMUNITY)
Admission: RE | Admit: 2023-05-14 | Discharge: 2023-05-14 | Disposition: A | Discharge status: Home / Self Care | Payer: No Typology Code available for payment source | Source: Ambulatory Visit | Attending: Obstetrics and Gynecology | Admitting: Obstetrics and Gynecology

## 2023-05-14 ENCOUNTER — Ambulatory Visit: Payer: No Typology Code available for payment source | Admitting: Obstetrics and Gynecology

## 2023-05-14 VITALS — BP 149/92 | HR 90 | Ht 62.0 in | Wt 172.8 lb

## 2023-05-14 DIAGNOSIS — Z113 Encounter for screening for infections with a predominantly sexual mode of transmission: Secondary | ICD-10-CM

## 2023-05-14 DIAGNOSIS — N950 Postmenopausal bleeding: Secondary | ICD-10-CM | POA: Diagnosis not present

## 2023-05-14 DIAGNOSIS — N939 Abnormal uterine and vaginal bleeding, unspecified: Secondary | ICD-10-CM | POA: Diagnosis not present

## 2023-05-14 DIAGNOSIS — N952 Postmenopausal atrophic vaginitis: Secondary | ICD-10-CM | POA: Diagnosis not present

## 2023-05-14 DIAGNOSIS — Z124 Encounter for screening for malignant neoplasm of cervix: Secondary | ICD-10-CM | POA: Insufficient documentation

## 2023-05-14 DIAGNOSIS — N72 Inflammatory disease of cervix uteri: Secondary | ICD-10-CM | POA: Diagnosis not present

## 2023-05-14 DIAGNOSIS — Z975 Presence of (intrauterine) contraceptive device: Secondary | ICD-10-CM

## 2023-05-14 MED ORDER — PREMARIN 0.625 MG/GM VA CREA: TOPICAL_CREAM | Freq: Every day | VAGINAL | 2 refills | Status: DC

## 2023-05-14 NOTE — Progress Notes (Signed)
ENDOMETRIAL BIOPSY      Melanie Garrett is a 67 y.o. No obstetric history on file. here for endometrial biopsy.  The indications for endometrial biopsy were reviewed.  Risks of the biopsy including cramping, bleeding, infection, uterine perforation, inadequate specimen and need for additional procedures were discussed. The patient states she understands and agrees to undergo procedure today. Consent was signed. Time out was performed.   Indications: abnormal uterine bleeding Urine HCG: n/a  A bivalve speculum was placed into the vagina and the cervix was easily visualized and was prepped with Betadine x2. A single-toothed tenaculum was placed on the anterior lip of the cervix to stabilize it. The 3 mm pipelle was introduced into the endometrial cavity without difficulty to a depth of 7 cm, and a moderate amount of tissue was obtained and sent to pathology. This was repeated for a total of 3 passes. The instruments were removed from the patient's vagina. Minimal bleeding from the cervix at the tenaculum was noted.   The patient tolerated the procedure well. Routine post-procedure instructions were given to the patient.    Will base further management on results of biopsy.  Baldemar Lenis, MD, [facility], The Attending Center for [employer] [facility])

## 2023-05-14 NOTE — Progress Notes (Signed)
GYNECOLOGY OFFICE NOTE  History:  67 y.o. No obstetric history on file. here today for occasional vaginal spotting. Has been seeing light red spotting a few times a week for the last two months. No clots, denies itching, burning. Went through menopause in her fifties and has not seen spotting since. Has some type of IUD in place, has been in place for 40 years.   Last saw gynecologist many years ago.   Has normal paps in EMR from 2018 and 2011, she denies ever having had an abnormal pap.  Past Medical History:  Diagnosis Date   Osteoarthritis 02/09/2020   RA (rheumatoid arthritis) (HCC) 04/05/2021    Past Surgical History:  Procedure Laterality Date   KNEE SURGERY Left    acl repair     Current Outpatient Medications:    Cyanocobalamin (VITAMIN B-12 PO), Take by mouth as needed., Disp: , Rfl:    leflunomide (ARAVA) 20 MG tablet, TAKE 1 TABLET BY MOUTH EVERY DAY, Disp: 90 tablet, Rfl: 0   RINVOQ 15 MG TB24, TAKE 1 TABLET BY MOUTH 1 TIME A DAY, Disp: 90 tablet, Rfl: 0   conjugated estrogens (PREMARIN) vaginal cream, Place vaginally daily. Place 0.5 g intravaginally once daily for two weeks, then reduce to twice per week., Disp: 29.96 g, Rfl: 2   folic acid (FOLVITE) 1 MG tablet, Take 1 mg by mouth daily. (Patient not taking: Reported on 04/08/2023), Disp: , Rfl:    potassium chloride SA (KLOR-CON) 20 MEQ tablet, Take 1 tablet (20 mEq total) by mouth daily. (Patient not taking: Reported on 03/18/2023), Disp: 90 tablet, Rfl: 0   traMADol (ULTRAM) 50 MG tablet, Take 1 tablet (50 mg total) by mouth every 6 (six) hours as needed. (Patient not taking: Reported on 05/14/2023), Disp: 75 tablet, Rfl: 3   Ubrogepant (UBRELVY) 100 MG TABS, Take 1 tablet by mouth daily as needed. (Patient not taking: Reported on 04/08/2023), Disp: 30 tablet, Rfl: 1   VITAMIN D PO, Take by mouth as needed. (Patient not taking: Reported on 05/14/2023), Disp: , Rfl:   The following portions of the patient's history  were reviewed and updated as appropriate: allergies, current medications, past family history, past medical history, past social history, past surgical history and problem list.   Review of Systems:  Pertinent items noted in HPI and remainder of comprehensive ROS otherwise negative.   Objective:  Physical Exam BP (!) 149/92   Pulse 90   Ht 5\' 2"  (1.575 m)   Wt 172 lb 12.8 oz (78.4 kg)   BMI 31.61 kg/m  CONSTITUTIONAL: Well-developed, well-nourished female in no acute distress.  HENT:  Normocephalic, atraumatic. External right and left ear normal. Oropharynx is clear and moist EYES: Conjunctivae and EOM are normal. Pupils are equal, round, and reactive to light. No scleral icterus.  NECK: Normal range of motion, supple, no masses SKIN: Skin is warm and dry. No rash noted. Not diaphoretic. No erythema. No pallor. NEUROLOGIC: Alert and oriented to person, place, and time. Normal reflexes, muscle tone coordination. No cranial nerve deficit noted. PSYCHIATRIC: Normal mood and affect. Normal behavior. Normal judgment and thought content. CARDIOVASCULAR: Normal heart rate noted RESPIRATORY: Effort normal, no problems with respiration noted ABDOMEN: Soft, no distention noted.   PELVIC: Normal appearing external genitalia; atrophic appearing vaginal mucosa and cervix, IUD string protruding from cervix.  No abnormal discharge noted.  Pap smear obtained.  pelvic cultures obtained. EMB completed, see note MUSCULOSKELETAL: Normal range of motion. No edema noted.  Exam  done with chaperone present.  Labs and Imaging No results found.  Assessment & Plan:   1. Cervical cancer screening Normal pap 2018 and 2011, reviewed recs to dc pap screen at age 50 if 20 normal, they agree with screening this year then d/c if normal - Cytology - PAP( [place])  2. Abnormal uterine bleeding (AUB) Reviewed rec for [place] vs endometrial biopsy, would like to proceed with biopsy - Cervicovaginal ancillary only(  [place])  3. Routine screening for STI (sexually transmitted infection) - Cervicovaginal ancillary only( [place])  4. Vaginal atrophy Start estrogen cream  5. Post-menopausal bleeding EMB note - Surgical pathology( / [facility])  6. IUD (intrauterine device) in place Strings noted, patient reports has been in place for 40 years  Routine preventative health maintenance measures emphasized. Please refer to After Visit Summary for other counseling recommendations.   Return in about 2 months (around 07/14/2023) for with MD.   Baldemar Lenis, MD, [facility] Attending Center for [facility])

## 2023-05-15 LAB — CERVICOVAGINAL ANCILLARY ONLY
Bacterial Vaginitis (gardnerella): NEGATIVE
Candida Glabrata: NEGATIVE
Candida Vaginitis: NEGATIVE
Chlamydia: NEGATIVE
Comment: NEGATIVE
Comment: NEGATIVE
Comment: NEGATIVE
Comment: NEGATIVE
Comment: NEGATIVE
Comment: NORMAL
Neisseria Gonorrhea: NEGATIVE
Trichomonas: POSITIVE — AB

## 2023-05-16 ENCOUNTER — Other Ambulatory Visit: Payer: Self-pay | Admitting: Family Medicine

## 2023-05-16 LAB — CYTOLOGY - PAP
Comment: NEGATIVE
Diagnosis: NEGATIVE
High risk HPV: NEGATIVE

## 2023-05-16 LAB — SURGICAL PATHOLOGY

## 2023-05-16 MED ORDER — METRONIDAZOLE 500 MG PO TABS: 500.0000 mg | ORAL_TABLET | Freq: Two times a day (BID) | ORAL | 0 refills | Status: AC

## 2023-05-20 ENCOUNTER — Ambulatory Visit (INDEPENDENT_AMBULATORY_CARE_PROVIDER_SITE_OTHER): Payer: No Typology Code available for payment source | Admitting: Family Medicine

## 2023-05-20 ENCOUNTER — Ambulatory Visit (INDEPENDENT_AMBULATORY_CARE_PROVIDER_SITE_OTHER): Payer: No Typology Code available for payment source

## 2023-05-20 ENCOUNTER — Encounter: Payer: Self-pay | Admitting: Family Medicine

## 2023-05-20 VITALS — BP 130/84 | HR 90 | Temp 97.6°F | Ht 62.0 in | Wt 171.0 lb

## 2023-05-20 DIAGNOSIS — R051 Acute cough: Secondary | ICD-10-CM | POA: Diagnosis not present

## 2023-05-20 DIAGNOSIS — Z8611 Personal history of tuberculosis: Secondary | ICD-10-CM

## 2023-05-20 DIAGNOSIS — Z79899 Other long term (current) drug therapy: Secondary | ICD-10-CM

## 2023-05-20 DIAGNOSIS — D84821 Immunodeficiency due to drugs: Secondary | ICD-10-CM

## 2023-05-20 DIAGNOSIS — J209 Acute bronchitis, unspecified: Secondary | ICD-10-CM

## 2023-05-20 DIAGNOSIS — R059 Cough, unspecified: Secondary | ICD-10-CM | POA: Diagnosis not present

## 2023-05-20 LAB — CBC WITH DIFFERENTIAL/PLATELET
Basophils Absolute: 0.1 10*3/uL (ref 0.0–0.1)
Basophils Relative: 1.1 % (ref 0.0–3.0)
Eosinophils Absolute: 0.1 10*3/uL (ref 0.0–0.7)
Eosinophils Relative: 2.4 % (ref 0.0–5.0)
HCT: 35.3 % — ABNORMAL LOW (ref 36.0–46.0)
Hemoglobin: 11.2 g/dL — ABNORMAL LOW (ref 12.0–15.0)
Lymphocytes Relative: 42.9 % (ref 12.0–46.0)
Lymphs Abs: 2.2 10*3/uL (ref 0.7–4.0)
MCHC: 31.8 g/dL (ref 30.0–36.0)
MCV: 89.7 fL (ref 78.0–100.0)
Monocytes Absolute: 0.7 10*3/uL (ref 0.1–1.0)
Monocytes Relative: 13 % — ABNORMAL HIGH (ref 3.0–12.0)
Neutro Abs: 2.1 10*3/uL (ref 1.4–7.7)
Neutrophils Relative %: 40.6 % — ABNORMAL LOW (ref 43.0–77.0)
Platelets: 259 10*3/uL (ref 150.0–400.0)
RBC: 3.94 Mil/uL (ref 3.87–5.11)
RDW: 15.5 % (ref 11.5–15.5)
WBC: 5.2 10*3/uL (ref 4.0–10.5)

## 2023-05-20 MED ORDER — BENZONATATE 100 MG PO CAPS: 200.0000 mg | ORAL_CAPSULE | Freq: Three times a day (TID) | ORAL | 0 refills | Status: DC | PRN

## 2023-05-20 NOTE — Progress Notes (Signed)
Acute Office Visit  Subjective:     Patient ID: Melanie Garrett, female    DOB: 1955/06/28, 67 y.o.   MRN: [id number]  Chief Complaint  Patient presents with   Cough    Dry cough for over a month, worse at night     Reviewed  Cough   Patient is in today for persistent cough for the last month. Has been taking robitussin with honey and nyquil. Drinking fluids She takes Arava and Rinvoq. Has history of latent TB, states that when this was not active she had no symptoms. She is accompanied by her daughter who is acting as historian. Reports the cough is worse at night and in the morning. Denies productive cough, fever, rash, known sick contacts, abdominal pain, nausea, vomiting, diarrhea, other symptoms.    Review of Systems  Respiratory:  Positive for cough.    Per HPI      Objective:    BP 130/84 (BP Location: Left Arm, Patient Position: Sitting, Cuff Size: Large)   Pulse 90   Temp 97.6 F (36.4 C) (Temporal)   Ht 5\' 2"  (1.575 m)   Wt 171 lb (77.6 kg)   SpO2 95%   BMI 31.28 kg/m    Physical Exam Vitals and nursing note reviewed.  Constitutional:      Appearance: Normal appearance.  HENT:     Head: Normocephalic and atraumatic.     Right Ear: Tympanic membrane and ear canal normal.     Left Ear: Tympanic membrane and ear canal normal.     Nose: Rhinorrhea present.     Mouth/Throat:     Mouth: Mucous membranes are moist.     Pharynx: Oropharynx is clear. No posterior oropharyngeal erythema.  Cardiovascular:     Rate and Rhythm: Normal rate and regular rhythm.     Heart sounds: Normal heart sounds.  Pulmonary:     Effort: Pulmonary effort is normal.     Breath sounds: Normal air entry. Examination of the right-lower field reveals decreased breath sounds. Examination of the left-lower field reveals decreased breath sounds. Decreased breath sounds present.  Musculoskeletal:     Cervical back: Normal range of motion.  Lymphadenopathy:     Cervical: No  cervical adenopathy.  Neurological:     Mental Status: She is alert.     No results found for any visits on 05/20/23.      Assessment & Plan:  1. Acute cough - benzonatate (TESSALON PERLES) 100 MG capsule; Take 2 capsules (200 mg total) by mouth 3 (three) times daily as needed for cough.  Dispense: 20 capsule; Refill: 0 - DG Chest 2 View - CBC with Differential/Platelet - QuantiFERON-TB Gold Plus  2. Immunosuppression due to drug therapy (HCC) - DG Chest 2 View - CBC with Differential/Platelet - QuantiFERON-TB Gold Plus  3. History of TB (tuberculosis) - DG Chest 2 View - CBC with Differential/Platelet - QuantiFERON-TB Gold Plus  4. Acute bronchitis, unspecified organism Suspect viral illness, but given immunosuppression and previous history with TB, will get a chest x-ray and labs for confirmation   Chest x-ray reviewed by me, agree with radiology, no evidence of infection or intrathoracic process already  we will be in contact with any abnormal lab or x-ray results. Follow-up as needed    Meds ordered this encounter  Medications   benzonatate (TESSALON PERLES) 100 MG capsule    Sig: Take 2 capsules (200 mg total) by mouth 3 (three) times daily as needed for cough.  Dispense:  20 capsule    Refill:  0    Return if symptoms worsen or fail to improve.  Moshe Cipro, FNP

## 2023-05-23 LAB — QUANTIFERON-TB GOLD PLUS
Mitogen-NIL: 1.39 [IU]/mL
NIL: 0.06 [IU]/mL
QuantiFERON-TB Gold Plus: NEGATIVE
TB1-NIL: 0 [IU]/mL
TB2-NIL: 0 [IU]/mL

## 2023-06-23 ENCOUNTER — Other Ambulatory Visit: Payer: Self-pay | Admitting: Physician Assistant

## 2023-06-23 DIAGNOSIS — M059 Rheumatoid arthritis with rheumatoid factor, unspecified: Secondary | ICD-10-CM

## 2023-06-23 DIAGNOSIS — Z79899 Other long term (current) drug therapy: Secondary | ICD-10-CM

## 2023-06-24 ENCOUNTER — Other Ambulatory Visit: Payer: Self-pay | Admitting: *Deleted

## 2023-06-24 DIAGNOSIS — Z79899 Other long term (current) drug therapy: Secondary | ICD-10-CM | POA: Diagnosis not present

## 2023-06-24 NOTE — Telephone Encounter (Signed)
 Last Fill: 03/19/2023  Labs: 05/20/2023 Hgb 11.2, Hct 35.3, Neutrophils Relative 40.6  TB Gold: 05/20/2023 Neg    Next Visit: 08/27/2023  Last Visit: 03/18/2023  IK:Dzmnendpupcz rheumatoid arthritis   Current Dose per office note 03/18/2023: Rinvoq  15 mg 1 tablet by mouth daily   Patient advised she is due to update labs. Patient will be coming to update this afternoon.   Okay to refill Rinvoq ?

## 2023-06-25 LAB — CBC WITH DIFFERENTIAL/PLATELET
Absolute Lymphocytes: 2356 {cells}/uL (ref 850–3900)
Absolute Monocytes: 550 {cells}/uL (ref 200–950)
Basophils Absolute: 50 {cells}/uL (ref 0–200)
Basophils Relative: 1.2 %
Eosinophils Absolute: 101 {cells}/uL (ref 15–500)
Eosinophils Relative: 2.4 %
HCT: 35.1 % (ref 35.0–45.0)
Hemoglobin: 11 g/dL — ABNORMAL LOW (ref 11.7–15.5)
MCH: 28.3 pg (ref 27.0–33.0)
MCHC: 31.3 g/dL — ABNORMAL LOW (ref 32.0–36.0)
MCV: 90.2 fL (ref 80.0–100.0)
MPV: 10.9 fL (ref 7.5–12.5)
Monocytes Relative: 13.1 %
Neutro Abs: 1142 {cells}/uL — ABNORMAL LOW (ref 1500–7800)
Neutrophils Relative %: 27.2 %
Platelets: 287 10*3/uL (ref 140–400)
RBC: 3.89 10*6/uL (ref 3.80–5.10)
RDW: 13.3 % (ref 11.0–15.0)
Total Lymphocyte: 56.1 %
WBC: 4.2 10*3/uL (ref 3.8–10.8)

## 2023-06-25 LAB — COMPLETE METABOLIC PANEL WITH GFR
AG Ratio: 1.5 (calc) (ref 1.0–2.5)
ALT: 19 U/L (ref 6–29)
AST: 29 U/L (ref 10–35)
Albumin: 4.1 g/dL (ref 3.6–5.1)
Alkaline phosphatase (APISO): 67 U/L (ref 37–153)
BUN: 13 mg/dL (ref 7–25)
CO2: 26 mmol/L (ref 20–32)
Calcium: 9.5 mg/dL (ref 8.6–10.4)
Chloride: 106 mmol/L (ref 98–110)
Creat: 0.62 mg/dL (ref 0.50–1.05)
Globulin: 2.7 g/dL (ref 1.9–3.7)
Glucose, Bld: 80 mg/dL (ref 65–99)
Potassium: 3.9 mmol/L (ref 3.5–5.3)
Sodium: 141 mmol/L (ref 135–146)
Total Bilirubin: 0.5 mg/dL (ref 0.2–1.2)
Total Protein: 6.8 g/dL (ref 6.1–8.1)
eGFR: 98 mL/min/{1.73_m2} (ref >=60)

## 2023-06-25 NOTE — Progress Notes (Signed)
Hemoglobin is low and stable.  CMP is normal.  Please forward results to her PCP.

## 2023-07-14 ENCOUNTER — Ambulatory Visit: Payer: Medicare HMO | Admitting: Obstetrics and Gynecology

## 2023-07-14 NOTE — Progress Notes (Deleted)
GYNECOLOGY  VISIT   HPI: Melanie Garrett is a 68 y.o.   Married  {Race/ethnicity:17218}  female   No obstetric history on file. here for ***    Last seen 05/14/23 for postmenopausal vaginal spotting. She was prescribed Flagyl for +trich and estrogen cream for vaginal atrophy. Had unremarkable EMB and pap.  Today ***   She denies vaginal bleeding, itching, dysuria, abnormal vaginal discharge or odor   GYNECOLOGIC HISTORY: No LMP recorded. Patient is postmenopausal. Contraception: {contraception:315051}.   Menopausal hormone therapy:  *** Last mammogram:  *** Last pap smear:  Diagnosis  Date Value Ref Range Status  05/14/2023   Final   - Negative for intraepithelial lesion or malignancy (NILM)           OB History   No obstetric history on file.        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 20 capsule 0   conjugated estrogens (PREMARIN) vaginal cream  Place vaginally daily. Place 0.5 g intravaginally once daily for two weeks, then reduce to twice per week. 29.96 g 2   Cyanocobalamin (VITAMIN B-12 PO) Take by mouth as needed.     folic acid (FOLVITE) 1 MG tablet Take 1 mg by mouth daily. (Patient not taking: Reported on 04/08/2023)     leflunomide (ARAVA) 20 MG tablet TAKE 1 TABLET BY MOUTH EVERY DAY 90 tablet 0   potassium chloride SA (KLOR-CON) 20 MEQ tablet Take 1 tablet (20 mEq total) by mouth daily. 90 tablet 0   RINVOQ 15 MG TB24 TAKE 1 TABLET BY MOUTH 1 TIME A DAY 30 tablet 0   traMADol (ULTRAM) 50 MG tablet Take 1 tablet (50 mg total) by mouth every 6 (six) hours as needed. 75 tablet 3   Ubrogepant (UBRELVY) 100 MG TABS Take 1 tablet by mouth daily as needed. 30 tablet 1   VITAMIN D PO Take by mouth as needed.     No current facility-administered medications for this visit.     ALLERGIES: Patient has no known allergies.  Family History  Problem Relation Age of Onset   Arthritis Mother    Lupus Sister    Lupus Brother    Seizures Brother    Hypertension Daughter    Arthritis Daughter    Hypertension Daughter    Arthritis Daughter    Alcohol abuse Neg Hx    Cancer Neg Hx    Diabetes Neg Hx    Drug abuse Neg Hx  Early death Neg Hx    Heart disease Neg Hx    Stroke Neg Hx     Social History   Socioeconomic History   Marital status: Married    Spouse name: Not on file   Number of children: Not on file   Years of education: Not on file   Highest education level: Not on file  Occupational History   Not on file  Tobacco Use   Smoking status: Never    Passive exposure: Never   Smokeless tobacco: Never  Vaping Use   Vaping status: Never Used  Substance and Sexual Activity   Alcohol use: No   Drug use: No   Sexual activity: Yes    Birth control/protection: Post-menopausal  Other Topics Concern   Not on file  Social History Narrative   Not on file   Social Drivers of Health   Financial Resource Strain:  Not on file  Food Insecurity: Not on file  Transportation Needs: Not on file  Physical Activity: Not on file  Stress: Not on file  Social Connections: Unknown (11/01/2021)   Received from [facility], [facility]   Social Network    Social Network: Not on file  Intimate Partner Violence: Unknown (09/24/2021)   Received from [facility], [facility]   HITS    Physically Hurt: Not on file    Insult or Talk Down To: Not on file    Threaten Physical Harm: Not on file    Scream or Curse: Not on file    Review of Systems  PHYSICAL EXAMINATION:    There were no vitals taken for this visit.    General appearance: alert, cooperative and appears stated age Head: Normocephalic, without obvious abnormality, atraumatic Neck: no adenopathy, supple, symmetrical, trachea midline and thyroid normal to inspection and palpation Lungs: clear to auscultation bilaterally Breasts: normal appearance, no masses or tenderness, No nipple retraction or dimpling, No nipple discharge or bleeding, No axillary or supraclavicular adenopathy Heart: regular rate and rhythm Abdomen: soft, non-tender, no masses,  no organomegaly Extremities: extremities normal, atraumatic, no cyanosis or edema Skin: Skin color, texture, turgor normal. No rashes or lesions Lymph nodes: Cervical, supraclavicular, and axillary nodes normal. No abnormal inguinal nodes palpated Neurologic: Grossly normal  Pelvic: External genitalia:  no lesions              Urethra:  normal appearing urethra with no masses, tenderness or lesions              Bartholins and Skenes: normal                 Vagina: normal appearing vagina with normal color and discharge, no lesions              Cervix: no lesions                Bimanual Exam:  Uterus:  normal size, contour, position, consistency, mobility, non-tender              Adnexa: no mass, fullness, tenderness              Rectal exam: {yes no:314532}.  Confirms.              Anus:   normal sphincter tone, no lesions  Chaperone was present for exam  ASSESSMENT & PLAN  There are no diagnoses linked to this encounter.      An After Visit Summary was printed and given to the patient.   [employer]  Celedonio Savage, PA-C 1/20/20257:59 AM

## 2023-07-30 ENCOUNTER — Other Ambulatory Visit: Payer: Self-pay | Admitting: Rheumatology

## 2023-07-30 NOTE — Telephone Encounter (Signed)
 Last Fill: 05/08/2023  Labs: 06/24/2023 Hemoglobin is low and stable.  CMP is normal.   Next Visit: 08/27/2023  Last Visit: 03/18/2023  DX:  Seropositive rheumatoid arthritis   Current Dose per office note 03/18/2023: arava  20 mg 1 tablet daily.   Okay to refill Arava  ?

## 2023-07-31 ENCOUNTER — Telehealth: Payer: Self-pay | Admitting: *Deleted

## 2023-07-31 NOTE — Telephone Encounter (Signed)
 Medication Samples have been provided to the patient.  Drug name: Rinvoq        Strength: 15 mg        Qty: 1  LOT: [id number]  Exp.Date: 03/15/2024  Dosing instructions: Take one tablet by mouth daily.

## 2023-07-31 NOTE — Telephone Encounter (Signed)
 Philippe, patient's daughter who is on dpr, contacted the office stating patient has ran out of her Rinvoq . She states patient was supposed to have her medication delivered yesterday but it was not. When she contacted the pharmacy she was advised it would not be delivered until the 08/06/2023. They requested a sample of Rinvoq .  Returned call to [facility] and advised they may come by the office to pick up a sample. Sample reserved in cabinet for patient. They plan on coming by the office today to pick up the sample.

## 2023-08-13 NOTE — Progress Notes (Deleted)
 Office Visit Note  Patient: Melanie Garrett             Date of Birth: 25-Jan-1956           MRN: [id number]             PCP: Etta Grandchild, MD Referring: Etta Grandchild, MD Visit Date: 08/27/2023 Occupation: @GUAROCC @  Subjective:  No chief complaint on file.   History of Present Illness: Melanie Garrett is a 68 y.o. female ***     Activities of Daily Living:  Patient reports morning stiffness for *** {minute/hour:19697}.   Patient {ACTIONS;DENIES/REPORTS:21021675::"Denies"} nocturnal pain.  Difficulty dressing/grooming: {ACTIONS;DENIES/REPORTS:21021675::"Denies"} Difficulty climbing stairs: {ACTIONS;DENIES/REPORTS:21021675::"Denies"} Difficulty getting out of chair: {ACTIONS;DENIES/REPORTS:21021675::"Denies"} Difficulty using hands for taps, buttons, cutlery, and/or writing: {ACTIONS;DENIES/REPORTS:21021675::"Denies"}  No Rheumatology ROS completed.   PMFS History:  Patient Active Problem List   Diagnosis Date Noted  . Arthritis of first metatarsophalangeal (MTP) joint of right foot 07/06/2021  . RA (rheumatoid arthritis) (HCC) 04/05/2021  . TB lung, latent 04/05/2021  . Intractable migraine without aura and without status migrainosus 08/21/2020  . Screening for cervical cancer 08/21/2020  . Screen for colon cancer 08/16/2020  . Visit for screening mammogram 08/16/2020  . Hypokalemia due to inadequate potassium intake 08/16/2020  . Osteoarthritis 02/09/2020  . Obesity (BMI 30.0-34.9) 06/29/2018  . 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 Routine general medical examination at a health care facility 09/02/2016  . Eczema, dyshidrotic 02/02/2015    Past Medical History:  Diagnosis Date  . Osteoarthritis 02/09/2020  . RA (rheumatoid arthritis) (HCC) 04/05/2021    Family History  Problem Relation Age of Onset  . Arthritis Mother   . Lupus Sister   . Lupus Brother   . Seizures Brother   . Hypertension Daughter   . Arthritis Daughter   . Hypertension Daughter   .  Arthritis Daughter   . Alcohol abuse Neg Hx   . Cancer Neg Hx   . Diabetes Neg Hx   . Drug abuse Neg Hx   . Early death Neg Hx   . Heart disease Neg Hx   . Stroke Neg Hx    Past Surgical History:  Procedure Laterality Date  . KNEE SURGERY Left    acl repair   Social History   Social History Narrative  . Not on file   Immunization History  Administered Date(s) Administered  . Influenza, High Dose Seasonal PF 03/27/2023  . Influenza,inj,Quad PF,6+ Mos 06/29/2018, 03/08/2021  . Influenza-Unspecified 02/23/2015, 03/24/2016, 03/24/2017  . PNEUMOCOCCAL CONJUGATE-20 07/12/2021, 03/27/2023  . Td 04/27/2010  . Tdap 05/04/2013  . Zoster Recombinant(Shingrix) 08/31/2022, 11/26/2022     Objective: Vital Signs: There were no vitals taken for this visit.   Physical Exam   Musculoskeletal Exam: ***  CDAI Exam: CDAI Score: -- Patient Global: --; Provider Global: -- Swollen: --; Tender: -- Joint Exam 08/27/2023   No joint exam has been documented for this visit   There is currently no information documented on the homunculus. Go to the Rheumatology activity and complete the homunculus joint exam.  Investigation: No additional findings.  Imaging: No results found.  Recent Labs: Lab Results  Component Value Date   WBC 4.2 06/24/2023   HGB 11.0 (L) 06/24/2023   PLT 287 06/24/2023   NA 141 06/24/2023   K 3.9 06/24/2023   CL 106 06/24/2023   CO2 26 06/24/2023   GLUCOSE 80 06/24/2023   BUN 13 06/24/2023   CREATININE 0.62 06/24/2023   BILITOT 0.5  06/24/2023   ALKPHOS 66 03/08/2021   AST 29 06/24/2023   ALT 19 06/24/2023   PROT 6.8 06/24/2023   ALBUMIN 3.9 03/08/2021   CALCIUM 9.5 06/24/2023   QFTBGOLDPLUS NEGATIVE 05/20/2023    Speciality Comments: dxd 08/21 [facility] MTX, Arava-inadequate response.  Intermittent use of Orencia due to positive TB Gold, (stopped on 03/07/22), Actemra started 03/14/22 Rinvoq 08/21/22 rifampin 08/2022X 4 months  Procedures:  No  procedures performed Allergies: Patient has no known allergies.   Assessment / Plan:     Visit Diagnoses: No diagnosis found.  Orders: No orders of the defined types were placed in this encounter.  No orders of the defined types were placed in this encounter.   Face-to-face time spent with patient was *** minutes. Greater than 50% of time was spent in counseling and coordination of care.  Follow-Up Instructions: No follow-ups on file.   Ellen Henri, CMA  Note - This record has been created using Animal nutritionist.  Chart creation errors have been sought, but may not always  have been located. Such creation errors do not reflect on  the standard of medical care.

## 2023-08-14 ENCOUNTER — Other Ambulatory Visit: Payer: Self-pay | Admitting: Physician Assistant

## 2023-08-14 DIAGNOSIS — M059 Rheumatoid arthritis with rheumatoid factor, unspecified: Secondary | ICD-10-CM

## 2023-08-14 DIAGNOSIS — Z79899 Other long term (current) drug therapy: Secondary | ICD-10-CM

## 2023-08-14 NOTE — Telephone Encounter (Signed)
 Last Fill: 06/24/2023 (30 day supply)  Labs: 06/24/2023 Hemoglobin is low and stable.  CMP is normal.   TB Gold: 05/20/2023 Neg    Next Visit: 08/27/2023  Last Visit: 03/18/2023  UJ:[id number] rheumatoid arthritis   Current Dose per office note 03/18/2023: Rinvoq 15 mg 1 tablet by mouth daily   Okay to refill Rinvoq?

## 2023-08-15 ENCOUNTER — Encounter: Payer: Self-pay | Admitting: Obstetrics and Gynecology

## 2023-08-15 ENCOUNTER — Ambulatory Visit (INDEPENDENT_AMBULATORY_CARE_PROVIDER_SITE_OTHER): Payer: Medicare HMO | Admitting: Obstetrics and Gynecology

## 2023-08-15 VITALS — BP 139/83 | HR 74 | Wt 174.0 lb

## 2023-08-15 DIAGNOSIS — N952 Postmenopausal atrophic vaginitis: Secondary | ICD-10-CM | POA: Diagnosis not present

## 2023-08-15 NOTE — Progress Notes (Signed)
 Pt is here for follow up - had appt and endo bx in November. Pt states she has had some light spotting since appt, denies pain.

## 2023-08-15 NOTE — Progress Notes (Signed)
 68 yo postmenopausal with BMI 31 returning to follow up on vaginal spotting. Patient reports some occasional spotting and admits to not using the premarin vaginal cream previously prescribed. She is sexually active and spotting seems to occur following intercourse. Patient is without any other complaints  Past Medical History:  Diagnosis Date   Osteoarthritis 02/09/2020   RA (rheumatoid arthritis) (HCC) 04/05/2021   Past Surgical History:  Procedure Laterality Date   KNEE SURGERY Left    acl repair   Family History  Problem Relation Age of Onset   Arthritis Mother    Lupus Sister    Lupus Brother    Seizures Brother    Hypertension Daughter    Arthritis Daughter    Hypertension Daughter    Arthritis Daughter    Alcohol abuse Neg Hx    Cancer Neg Hx    Diabetes Neg Hx    Drug abuse Neg Hx    Early death Neg Hx    Heart disease Neg Hx    Stroke Neg Hx    Social History   Tobacco Use   Smoking status: Never    Passive exposure: Never   Smokeless tobacco: Never  Vaping Use   Vaping status: Never Used  Substance Use Topics   Alcohol use: No   Drug use: No   ROS See pertinent in HPI. All other systems reviewed and non contributory Blood pressure 139/83, pulse 74, weight 174 lb (78.9 kg). GENERAL: Well-developed, well-nourished female in no acute distress.  NEURO: alert and oriented x 3  A/P 68 yo with likely atrophic vaginitis - Reviewed normal biopsy results - Discussed the benefits of the premarin vaginal cream for the management of atrophic vaginitis - Patient verbalized understanding and plans to use it - Patient declined IUD removal - RTC prn

## 2023-08-25 ENCOUNTER — Telehealth: Payer: Self-pay | Admitting: Pharmacist

## 2023-08-25 NOTE — Telephone Encounter (Signed)
 Submitted a Prior Authorization RENEWAL request to [facility] for RINVOQ via CoverMyMeds. Will update once we receive a response.  Key: [id number]   Chesley Mires, PharmD, MPH, BCPS, CPP Clinical Pharmacist (Rheumatology and Pulmonology)

## 2023-08-25 NOTE — Telephone Encounter (Signed)
 Received fax from [facility] that Rinvoq is APPROVED through 06/23/2024 for up to a 30-day supply  Chesley Mires, PharmD, MPH, BCPS, CPP Clinical Pharmacist (Rheumatology and Pulmonology)

## 2023-08-27 ENCOUNTER — Ambulatory Visit: Payer: No Typology Code available for payment source | Admitting: Rheumatology

## 2023-08-27 DIAGNOSIS — Z8269 Family history of other diseases of the musculoskeletal system and connective tissue: Secondary | ICD-10-CM

## 2023-08-27 DIAGNOSIS — M19071 Primary osteoarthritis, right ankle and foot: Secondary | ICD-10-CM

## 2023-08-27 DIAGNOSIS — E785 Hyperlipidemia, unspecified: Secondary | ICD-10-CM

## 2023-08-27 DIAGNOSIS — Z79899 Other long term (current) drug therapy: Secondary | ICD-10-CM

## 2023-08-27 DIAGNOSIS — M170 Bilateral primary osteoarthritis of knee: Secondary | ICD-10-CM

## 2023-08-27 DIAGNOSIS — M25572 Pain in left ankle and joints of left foot: Secondary | ICD-10-CM

## 2023-08-27 DIAGNOSIS — L501 Idiopathic urticaria: Secondary | ICD-10-CM

## 2023-08-27 DIAGNOSIS — M19041 Primary osteoarthritis, right hand: Secondary | ICD-10-CM

## 2023-08-27 DIAGNOSIS — M059 Rheumatoid arthritis with rheumatoid factor, unspecified: Secondary | ICD-10-CM

## 2023-08-27 DIAGNOSIS — L301 Dyshidrosis [pompholyx]: Secondary | ICD-10-CM

## 2023-08-27 DIAGNOSIS — G43019 Migraine without aura, intractable, without status migrainosus: Secondary | ICD-10-CM

## 2023-08-27 DIAGNOSIS — Z227 Latent tuberculosis: Secondary | ICD-10-CM

## 2023-09-02 NOTE — Progress Notes (Signed)
 Office Visit Note  Patient: Melanie Garrett             Date of Birth: 1955/07/06           MRN: [id number]             PCP: Etta Grandchild, MD Referring: Etta Grandchild, MD Visit Date: 09/16/2023 Occupation: @GUAROCC @  Subjective:  Medication management  History of Present Illness: Melanie Garrett is a 68 y.o. female with seropositive rheumatoid arthritis and osteoarthritis.  She states that she has been doing well on the combination of Rinvoq and Arava.  She denies interruption in the treatment.  She has not noticed any increased joint pain.  She continues to have some stiffness.  She also continues to have some swelling on both ankles due to fluid retention.  She states that she feels tightness when she wear shoes.    Activities of Daily Living:  Patient reports morning stiffness for 0 minutes.   Patient Denies nocturnal pain.  Difficulty dressing/grooming: Denies Difficulty climbing stairs: Denies Difficulty getting out of chair: Denies Difficulty using hands for taps, buttons, cutlery, and/or writing: Denies  Review of Systems  Constitutional:  Negative for fatigue.  HENT:  Negative for mouth sores, mouth dryness and nose dryness.   Eyes:  Negative for pain and dryness.  Respiratory:  Negative for shortness of breath and difficulty breathing.   Cardiovascular:  Negative for chest pain and palpitations.  Gastrointestinal:  Negative for blood in stool, constipation and diarrhea.  Endocrine: Negative for increased urination.  Genitourinary:  Negative for involuntary urination.  Musculoskeletal:  Positive for joint pain, joint pain and joint swelling. Negative for gait problem, myalgias, muscle weakness, morning stiffness, muscle tenderness and myalgias.  Skin:  Negative for color change, rash, hair loss and sensitivity to sunlight.  Allergic/Immunologic: Negative for susceptible to infections.  Neurological:  Negative for dizziness and headaches.  Hematological:  Negative  for swollen glands.  Psychiatric/Behavioral:  Negative for depressed mood and sleep disturbance. The patient is not nervous/anxious.     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   Routine general medical examination at a health care facility 09/02/2016   Eczema, dyshidrotic 02/02/2015    Past Medical History:  Diagnosis Date   Osteoarthritis 02/09/2020   RA (rheumatoid arthritis) (HCC) 04/05/2021    Family History  Problem Relation Age of Onset   Arthritis Mother    Lupus Sister    Lupus Brother    Seizures Brother    Hypertension Daughter    Arthritis Daughter    Hypertension Daughter    Arthritis Daughter    Alcohol abuse Neg Hx    Cancer Neg Hx    Diabetes Neg Hx    Drug abuse Neg Hx    Early death Neg Hx    Heart disease Neg Hx    Stroke Neg Hx    Past Surgical History:  Procedure Laterality Date   KNEE SURGERY Left    acl  repair   Social History   Social History Narrative   Not on file   Immunization History  Administered Date(s) Administered   Influenza, High Dose Seasonal PF 03/27/2023   Influenza,inj,Quad PF,6+ Mos 06/29/2018, 03/08/2021   Influenza-Unspecified 02/23/2015, 03/24/2016, 03/24/2017   PNEUMOCOCCAL CONJUGATE-20 07/12/2021, 03/27/2023   Td 04/27/2010   Tdap 05/04/2013   Zoster Recombinant(Shingrix) 08/31/2022, 11/26/2022     Objective: Vital Signs: BP 135/88 (BP Location: Left Arm, Patient Position:  Sitting, Cuff Size: Normal)   Pulse 66   Resp 14   Ht 5\' 2"  (1.575 m)   Wt 174 lb 12.8 oz (79.3 kg)   BMI 31.97 kg/m    Physical Exam Vitals and nursing note reviewed.  Constitutional:      Appearance: She is well-developed.  HENT:     Head: Normocephalic and atraumatic.  Eyes:     Conjunctiva/sclera: Conjunctivae normal.  Cardiovascular:     Rate and Rhythm: Normal rate and regular rhythm.     Heart sounds: Normal heart sounds.  Pulmonary:     Effort: Pulmonary effort is normal.     Breath sounds: Normal breath sounds.  Abdominal:     General: Bowel sounds are normal.     Palpations: Abdomen is soft.  Musculoskeletal:     Cervical back: Normal range of motion.  Lymphadenopathy:     Cervical: No cervical adenopathy.  Skin:    General: Skin is warm and dry.     Capillary Refill: Capillary refill takes less than 2 seconds.  Neurological:     Mental Status: She is alert and oriented to person, place, and time.  Psychiatric:        Behavior: Behavior normal.      Musculoskeletal Exam: She had good range of motion of the cervical spine.  Thoracic and lumbar spine range of motion was difficult to assess due to body habitus.  Shoulders, elbows, wrists, MCPs, PIPs and DIPs were in good range of motion.  Bilateral MCP thickening and ulnar deviation was noted.  No synovitis was noted.  She had good range of motion of her hip joints and knee joints without any warmth swelling or effusion.  Bilateral pedal edema was noted.  No synovitis was noted over the ankles or MTPs.  CDAI Exam: CDAI Score: -- Patient Global: --; Provider Global: -- Swollen: --; Tender: -- Joint Exam 09/16/2023   No joint exam has been documented for this visit   There is currently no information documented on the homunculus. Go to the Rheumatology activity and complete the homunculus joint exam.  Investigation: No additional findings.  Imaging: No results found.  Recent Labs: Lab Results  Component  Value Date   WBC 4.2 06/24/2023   HGB 11.0 (L) 06/24/2023   PLT 287 06/24/2023   NA 141 06/24/2023   K 3.9 06/24/2023   CL 106 06/24/2023   CO2 26 06/24/2023   GLUCOSE 80 06/24/2023   BUN 13 06/24/2023   CREATININE 0.62 06/24/2023   BILITOT 0.5 06/24/2023   ALKPHOS 66 03/08/2021   AST 29 06/24/2023   ALT 19 06/24/2023   PROT 6.8 06/24/2023   ALBUMIN 3.9 03/08/2021   CALCIUM 9.5 06/24/2023   QFTBGOLDPLUS NEGATIVE 05/20/2023    Speciality Comments: dxd 08/21 [facility] MTX, Arava-inadequate response.  Intermittent use of Orencia due to positive TB Gold, (stopped on 03/07/22), Actemra started 03/14/22 Rinvoq 08/21/22 rifampin 08/2022X 4 months  Procedures:  No procedures performed Allergies: Patient has no known allergies.   Assessment /  Plan:     Visit Diagnoses: Seropositive rheumatoid arthritis (HCC) - +RF,+CCP Ab - positive ANA, elevated sedimentation rate, severe synovitis.  DXd 08/21 at [facility]: She denies any increased joint pain or swelling.  She continues to take Rinvoq and leflunomide on a regular basis without any interruption.  She denies morning stiffness or episodes of joint swelling since her last visit.  High risk medication use - Rinvoq 15 mg 1 tablet by mouth daily and arava 20 mg 1 tablet daily.  June 24, 2023 CBC was stable with hemoglobin of 11.0.  CMP was normal.  TB Gold was negative on May 20, 2023.  CBC and CMP will be monitored every 3 months and TB Gold annually.  Inadequate response to actemra and orencia. - Plan: CBC with Differential/Platelet, COMPLETE METABOLIC PANEL WITH GFR, QuantiFERON-TB Gold Plus.  Information on immunization was placed in the AVS.  She was advised to hold Arava and Rinvoq if she develops an infection resume after the infection resolves.  FDA warning regarding major adverse cardiovascular events, thrombosis, serious infection, lymphoma, pulmonary embolism and DVTs was discussed.  A handout was placed in the AVS.  Annual skin  examination to screen for skin cancer was advised while she is on Rinvoq.  Use of sunscreen and sun protection was advised.  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 CXR 10/12/21 no active cardiopulmonary disease. TB gold negative on Nov 08, 2022 and May 20, 2023  Primary osteoarthritis of both hands-she has rheumatoid arthritis and osteoarthritis overlap with bilateral MCP thickening and ulnar deviation.  PIP and DIP thickening was noted.  She denies discomfort in her hands.  She has some stiffness.  Primary osteoarthritis of both knees-she denies a discomfort in her knee joints.  No warmth swelling or effusion was noted.  Primary osteoarthritis of both feet-she feels tightness in her feet due to underlying pedal edema.  Pedal edema-bilateral lower extremity edema was noted.  Other medical problems are listed as follows:  Urticaria, idiopathic  Intractable migraine without aura and without status migrainosus  Eczema, dyshidrotic  Family history of systemic lupus erythematosus, mother and brother  Dyslipidemia-LDL was normal at 69 on Nov 08, 2022.  Will check lipid panel with her next labs.  Orders: Orders Placed This Encounter  Procedures   CBC with Differential/Platelet   COMPLETE METABOLIC PANEL WITH GFR   Lipid panel   No orders of the defined types were placed in this encounter.    Follow-Up Instructions: Return in about 5 months (around 02/16/2024) for Rheumatoid arthritis.   Pollyann Savoy, MD  Note - This record has been created using Animal nutritionist.  Chart creation errors have been sought, but may not always  have been located. Such creation errors do not reflect on  the standard of medical care.

## 2023-09-12 DIAGNOSIS — Z1231 Encounter for screening mammogram for malignant neoplasm of breast: Secondary | ICD-10-CM | POA: Diagnosis not present

## 2023-09-12 LAB — HM MAMMOGRAPHY

## 2023-09-15 ENCOUNTER — Encounter: Payer: Self-pay | Admitting: Internal Medicine

## 2023-09-16 ENCOUNTER — Ambulatory Visit: Payer: Self-pay | Attending: Rheumatology | Admitting: Rheumatology

## 2023-09-16 ENCOUNTER — Encounter: Payer: Self-pay | Admitting: Rheumatology

## 2023-09-16 VITALS — BP 135/88 | HR 66 | Resp 14 | Ht 62.0 in | Wt 174.8 lb

## 2023-09-16 DIAGNOSIS — E785 Hyperlipidemia, unspecified: Secondary | ICD-10-CM

## 2023-09-16 DIAGNOSIS — L501 Idiopathic urticaria: Secondary | ICD-10-CM

## 2023-09-16 DIAGNOSIS — R600 Localized edema: Secondary | ICD-10-CM

## 2023-09-16 DIAGNOSIS — G43019 Migraine without aura, intractable, without status migrainosus: Secondary | ICD-10-CM

## 2023-09-16 DIAGNOSIS — M19071 Primary osteoarthritis, right ankle and foot: Secondary | ICD-10-CM | POA: Diagnosis not present

## 2023-09-16 DIAGNOSIS — L301 Dyshidrosis [pompholyx]: Secondary | ICD-10-CM

## 2023-09-16 DIAGNOSIS — Z79899 Other long term (current) drug therapy: Secondary | ICD-10-CM

## 2023-09-16 DIAGNOSIS — M19041 Primary osteoarthritis, right hand: Secondary | ICD-10-CM

## 2023-09-16 DIAGNOSIS — M170 Bilateral primary osteoarthritis of knee: Secondary | ICD-10-CM

## 2023-09-16 DIAGNOSIS — M25572 Pain in left ankle and joints of left foot: Secondary | ICD-10-CM

## 2023-09-16 DIAGNOSIS — Z8269 Family history of other diseases of the musculoskeletal system and connective tissue: Secondary | ICD-10-CM

## 2023-09-16 DIAGNOSIS — M19072 Primary osteoarthritis, left ankle and foot: Secondary | ICD-10-CM

## 2023-09-16 DIAGNOSIS — M059 Rheumatoid arthritis with rheumatoid factor, unspecified: Secondary | ICD-10-CM | POA: Diagnosis not present

## 2023-09-16 DIAGNOSIS — Z227 Latent tuberculosis: Secondary | ICD-10-CM

## 2023-09-16 DIAGNOSIS — M19042 Primary osteoarthritis, left hand: Secondary | ICD-10-CM

## 2023-09-16 LAB — CBC WITH DIFFERENTIAL/PLATELET
Absolute Lymphocytes: 2455 {cells}/uL (ref 850–3900)
Absolute Monocytes: 532 {cells}/uL (ref 200–950)
Basophils Absolute: 62 {cells}/uL (ref 0–200)
Basophils Relative: 1.4 %
Eosinophils Absolute: 158 {cells}/uL (ref 15–500)
Eosinophils Relative: 3.6 %
HCT: 32.5 % — ABNORMAL LOW (ref 35.0–45.0)
Hemoglobin: 10.4 g/dL — ABNORMAL LOW (ref 11.7–15.5)
MCH: 28.3 pg (ref 27.0–33.0)
MCHC: 32 g/dL (ref 32.0–36.0)
MCV: 88.6 fL (ref 80.0–100.0)
MPV: 11.2 fL (ref 7.5–12.5)
Monocytes Relative: 12.1 %
Neutro Abs: 1192 {cells}/uL — ABNORMAL LOW (ref 1500–7800)
Neutrophils Relative %: 27.1 %
Platelets: 283 10*3/uL (ref 140–400)
RBC: 3.67 10*6/uL — ABNORMAL LOW (ref 3.80–5.10)
RDW: 13.4 % (ref 11.0–15.0)
Total Lymphocyte: 55.8 %
WBC: 4.4 10*3/uL (ref 3.8–10.8)

## 2023-09-16 LAB — COMPREHENSIVE METABOLIC PANEL
AG Ratio: 1.7 (calc) (ref 1.0–2.5)
ALT: 16 U/L (ref 6–29)
AST: 30 U/L (ref 10–35)
Albumin: 3.9 g/dL (ref 3.6–5.1)
Alkaline phosphatase (APISO): 61 U/L (ref 37–153)
BUN: 15 mg/dL (ref 7–25)
CO2: 28 mmol/L (ref 20–32)
Calcium: 9.4 mg/dL (ref 8.6–10.4)
Chloride: 107 mmol/L (ref 98–110)
Creat: 0.62 mg/dL (ref 0.50–1.05)
Globulin: 2.3 g/dL (ref 1.9–3.7)
Glucose, Bld: 85 mg/dL (ref 65–99)
Potassium: 4.3 mmol/L (ref 3.5–5.3)
Sodium: 140 mmol/L (ref 135–146)
Total Bilirubin: 0.4 mg/dL (ref 0.2–1.2)
Total Protein: 6.2 g/dL (ref 6.1–8.1)
eGFR: 98 mL/min/{1.73_m2} (ref >=60)

## 2023-09-16 NOTE — Patient Instructions (Addendum)
 Standing Labs We placed an order today for your standing lab work.   Please have your standing labs drawn in June and every 3 months  Please have your labs drawn 2 weeks prior to your appointment so that the provider can discuss your lab results at your appointment, if possible.  Please note that you may see your imaging and lab results in MyChart before we have reviewed them. We will contact you once all results are reviewed. Please allow our office up to 72 hours to thoroughly review all of the results before contacting the office for clarification of your results.  WALK-IN LAB HOURS  Monday through Thursday from 8:00 am -12:30 pm and 1:00 pm-5:00 pm and Friday from 8:00 am-12:00 pm.  Patients with office visits requiring labs will be seen before walk-in labs.  You may encounter longer than normal wait times. Please allow additional time. Wait times may be shorter on  Monday and Thursday afternoons.  We do not book appointments for walk-in labs. We appreciate your patience and understanding with our staff.   Labs are drawn by [facility]. Please bring your co-pay at the time of your lab draw.  You may receive a bill from [facility] for your lab work.  Please note if you are on Hydroxychloroquine and and an order has been placed for a Hydroxychloroquine level,  you will need to have it drawn 4 hours or more after your last dose.  If you wish to have your labs drawn at another location, please call the office 24 hours in advance so we can fax the orders.  The office is located at [address], [address], [place], [place] [address]   If you have any questions regarding directions or hours of operation,  please call [phone].   As a reminder, please drink plenty of water prior to coming for your lab work. Thanks!   Vaccines You are taking a medication(s) that can suppress your immune system.  The following immunizations are recommended: Flu annually Covid-19  RSV Td/Tdap (tetanus,  diphtheria, pertussis) every 10 years Pneumonia (Prevnar 15 then Pneumovax 23 at least 1 year apart.  Alternatively, can take Prevnar 20 without needing additional dose) Shingrix: 2 doses from 4 weeks to 6 months apart  Please check with your PCP to make sure you are up to date.  If you have signs or symptoms of an infection or start antibiotics: First, call your PCP for workup of your infection. Hold your medication through the infection, until you complete your antibiotics, and until symptoms resolve if you take the following: Injectable medication (Actemra, Benlysta, Cimzia, Cosentyx, Enbrel, Humira, Kevzara, Orencia, Remicade, Simponi, Stelara, Taltz, Tremfya) Methotrexate Leflunomide (Arava) Mycophenolate (Cellcept) Osborne Oman, or Rinvoq   Because you are taking Harriette Ohara, Rinvoq, or Olumiant, it is very important to know that this class of medications has a FDA BLACK BOX WARNING for major adverse cardiovascular events (MACE), thrombosis, mortality (including sudden cardiovascular death), serious infections, and lymphomas. MACE is defined as cardiovascular death, myocardial infarction, and stroke. Thrombosis includes deep venous thrombosis (DVT), pulmonary embolism (PE), and arterial thrombosis. If you are a current or former smoker, you are at higher risk for MACE.  Please get annual skin examination to screen for skin cancer while you are on Xeljanz.  Please use sunscreen and sun protection.

## 2023-09-17 NOTE — Progress Notes (Signed)
 CMP is normal.  Hemoglobin is low at 10.4.  Patient should take multivitamin with iron.  Please forward results to her PCP.

## 2023-10-30 ENCOUNTER — Other Ambulatory Visit: Payer: Self-pay | Admitting: Physician Assistant

## 2023-10-30 NOTE — Telephone Encounter (Signed)
 Last Fill: 07/30/2023  Labs: 09/16/2023 CMP is normal.  Hemoglobin is low at 10.4.     Next Visit: 02/17/2024  Last Visit: 09/16/2023  DX: Seropositive rheumatoid arthritis   Current Dose per office note 09/16/2023:  arava  20 mg 1 tablet daily.   Okay to refill Arava  ?

## 2023-11-10 ENCOUNTER — Other Ambulatory Visit: Payer: Self-pay | Admitting: Rheumatology

## 2023-11-10 DIAGNOSIS — Z79899 Other long term (current) drug therapy: Secondary | ICD-10-CM

## 2023-11-10 DIAGNOSIS — M059 Rheumatoid arthritis with rheumatoid factor, unspecified: Secondary | ICD-10-CM

## 2023-11-11 NOTE — Telephone Encounter (Signed)
 Last Fill: 08/14/2023  Labs: 09/16/2023 CMP is normal.  Hemoglobin is low at 10.4.   TB Gold: 05/20/2023 Neg   Next Visit: 02/17/2024  Last Visit: 09/16/2023  [id number] rheumatoid arthritis   Current Dose per office note 09/16/2023: Rinvoq  15 mg 1 tablet by mouth daily   Okay to refill Rinvoq ?

## 2023-11-24 DIAGNOSIS — H5203 Hypermetropia, bilateral: Secondary | ICD-10-CM | POA: Diagnosis not present

## 2023-11-24 DIAGNOSIS — H524 Presbyopia: Secondary | ICD-10-CM | POA: Diagnosis not present

## 2023-11-26 ENCOUNTER — Ambulatory Visit (INDEPENDENT_AMBULATORY_CARE_PROVIDER_SITE_OTHER): Payer: No Typology Code available for payment source | Admitting: Otolaryngology

## 2023-11-26 ENCOUNTER — Ambulatory Visit (INDEPENDENT_AMBULATORY_CARE_PROVIDER_SITE_OTHER): Payer: No Typology Code available for payment source | Admitting: Audiology

## 2023-11-26 ENCOUNTER — Encounter (INDEPENDENT_AMBULATORY_CARE_PROVIDER_SITE_OTHER): Payer: Self-pay | Admitting: Otolaryngology

## 2023-11-26 VITALS — HR 83 | Ht 62.0 in | Wt 170.0 lb

## 2023-11-26 DIAGNOSIS — H9319 Tinnitus, unspecified ear: Secondary | ICD-10-CM

## 2023-11-26 DIAGNOSIS — H903 Sensorineural hearing loss, bilateral: Secondary | ICD-10-CM

## 2023-11-26 DIAGNOSIS — H9311 Tinnitus, right ear: Secondary | ICD-10-CM

## 2023-11-26 NOTE — Progress Notes (Signed)
  [address] [place], [place] [address] [phone]  Audiological Evaluation    Name: Melanie Garrett     DOB:   August 13, 1955      MRN:   [id number]                                                                                     Service Date: 11/26/2023     Accompanied by: daughter   Patient comes today after Dr. Lydia Sams, ENT sent a referral for a hearing evaluation due to concerns with tinnitus.   Symptoms Yes Details  Hearing loss  [x]  Not perceived difficulty by patient.   03-27-2023: normal hearing sloping to moderate sensorineural hearing loss bilaterally.    Tinnitus  [x]  Right sided, reports that with time seems to have gone away  Ear pain/ infections/pressure  []    Balance problems  []    Noise exposure history  []    Previous ear surgeries  []    Family history of hearing loss  []    Amplification  []    Other  []      Otoscopy: Right ear: Clear external ear canals and notable landmarks visualized on the tympanic membrane. Left ear:  Clear external ear canals and notable landmarks visualized on the tympanic membrane.  Tympanometry: Right ear: Type A- Normal external ear canal volume with normal middle ear pressure and tympanic membrane compliance. Left ear: Type A- Normal external ear canal volume with normal middle ear pressure and tympanic membrane compliance.    Pure tone Audiometry: Right ear- Borderline normal to moderate sensorineural hearing loss from 125 Hz - 8000 Hz. Left ear-  Normal hearing from [phone] Hz, then mild presumably  sensorineural hearing loss from 6000 Hz - 8000 Hz.  Speech Audiometry: Right ear- Speech Reception Threshold (SRT) was obtained at 25 dBHL. Left ear-Speech Reception Threshold (SRT) was obtained at 20 dBHL.   Word Recognition Score Tested using NU-6 (MLV) Right ear: 80% was obtained at a presentation level of 60 dBHL with contralateral masking which is deemed as  good . Left ear: 84% was obtained at a presentation  level of 60 dBHL with contralateral masking which is deemed as  good .   The hearing test results were completed under inserts and re-checked with headphones  and results are deemed to be of good reliability. Test technique:  conventional    Impression: There is a significant difference in pure-tone thresholds between ears, slightly worse across all tested frequencies worse in the right ear.   Recommendations: Follow up with ENT as scheduled for today. Return for a hearing evaluation in one year, before if concerns with hearing changes arise or per MD recommendation.   Kongmeng Santoro MARIE LEROUX-MARTINEZ, AUD

## 2023-11-26 NOTE — Patient Instructions (Signed)
 I have ordered an imaging study for you to complete prior to your next visit. Please call [facility] Scheduling at [phone] to schedule your imaging if you have not received a call within 24 hours. If you are unable to complete your imaging study prior to your next scheduled visit please call our office to let us know.

## 2023-11-26 NOTE — Progress Notes (Unsigned)
 Dear Dr. Rochelle Chu, Here is my assessment for our mutual patient, Melanie Garrett. Thank you for allowing me the opportunity to care for your patient. Please do not hesitate to contact me should you have any other questions. Sincerely, Dr. Milon Aloe  Otolaryngology Clinic Note Referring provider: Dr. Rochelle Chu HPI:  Melanie Garrett is a 68 y.o. female kindly referred by Dr. Rochelle Chu for evaluation of bilateral SNHL and unilateral tinnitus. She saw Raynald Calkins, AuD for audio (see below).  Her primary complaint was the tinnitus, and the HL was found incidentally. She reports that it is intermittent tinnitus in the right ear. Few times a week, couple of minutes. Couple of months. No subjective difficulty hearing, no pain/pressure/drainage/vertigo/popping/crackling/deep pain/autophony/medication use such as aspirin. It is not bothersome. She denies significant noise exposure, barotrauma, vestibular suppressants. Low pitched, just buzzing, not pulsatile.  No ear surgery or problems or infections in the past.   No prior CT or MRI.  --------------------------------------------------------- 11/26/2023 Seen in follow up.   PMHx: Rheumatoid arthritis (on rinvoq , arava ), prior latent treated TB.   PMH/Meds/All/SocHx/FamHx/ROS:   Past Medical History:  Diagnosis Date   Osteoarthritis 02/09/2020   RA (rheumatoid arthritis) (HCC) 04/05/2021   Denies heart/lung/kidney issues, diabetes, previous diagnosis of cancer  Past Surgical History:  Procedure Laterality Date   KNEE SURGERY Left    acl repair   Denies history of head or neck surgery  Family History  Problem Relation Age of Onset   Arthritis Mother    Lupus Sister    Lupus Brother    Seizures Brother    Hypertension Daughter    Arthritis Daughter    Hypertension Daughter    Arthritis Daughter    Alcohol abuse Neg Hx    Cancer Neg Hx    Diabetes Neg Hx    Drug abuse Neg Hx    Early death Neg Hx    Heart disease Neg Hx    Stroke Neg Hx    No  family history of bleeding disorders or difficulty with anesthesia  Social Connections: Unknown (11/01/2021)   Received from [facility], [facility]   Social Network    Social Network: Not on file      Current Outpatient Medications:    Cyanocobalamin (VITAMIN B-12 PO), Take by mouth as needed., Disp: , Rfl:    leflunomide  (ARAVA ) 20 MG tablet, TAKE 1 TABLET BY MOUTH EVERY DAY, Disp: 90 tablet, Rfl: 0   RINVOQ  15 MG TB24, TAKE 1 TABLET BY MOUTH 1 TIME A DAY, Disp: 90 tablet, Rfl: 0   VITAMIN D PO, Take by mouth as needed., Disp: , Rfl:    benzonatate  (TESSALON  PERLES) 100 MG capsule, Take 2 capsules (200 mg total) by mouth 3 (three) times daily as needed for cough. (Patient not taking: Reported on 11/26/2023), Disp: 20 capsule, Rfl: 0   conjugated estrogens  (PREMARIN ) vaginal cream, Place vaginally daily. Place 0.5 g intravaginally once daily for two weeks, then reduce to twice per week. (Patient not taking: Reported on 11/26/2023), Disp: 29.96 g, Rfl: 2   folic acid  (FOLVITE ) 1 MG tablet, Take 1 mg by mouth daily. (Patient not taking: Reported on 11/26/2023), Disp: , Rfl:    potassium chloride  SA (KLOR-CON ) 20 MEQ tablet, Take 1 tablet (20 mEq total) by mouth daily. (Patient not taking: Reported on 11/26/2023), Disp: 90 tablet, Rfl: 0   traMADol  (ULTRAM ) 50 MG tablet, Take 1 tablet (50 mg total) by mouth every 6 (six) hours as needed. (Patient not taking: Reported on  11/26/2023), Disp: 75 tablet, Rfl: 3   Ubrogepant  (UBRELVY ) 100 MG TABS, Take 1 tablet by mouth daily as needed. (Patient not taking: Reported on 11/26/2023), Disp: 30 tablet, Rfl: 1    Physical Exam:   Pulse 83   Ht 5' 2 (1.575 m)   Wt 170 lb (77.1 kg)   SpO2 95%   BMI 31.09 kg/m    Salient findings:  CN II-XII intact; no numbness preauricularly Bilateral EAC clear and TM intact with well pneumatized middle ear spaces; minimal myringosclerosis centrally on right Weber 512: midline Rinne 512: AC > BC b/l  Rine 1024: AC > BC  b/l  Anterior rhinoscopy: Septum relatively midline; bilateral inferior turbinates without significant hypertrophy No lesions of oral cavity/oropharynx; dentition mostly intact No obviously palpable neck masses/lymphadenopathy/thyromegaly No respiratory distress or stridor  Independent Review of Additional Tests or Records:  03/27/2023 Audiogram was independently reviewed and interpreted by me and it reveals Bilateral sloping SNHL with borderline thresholds at lower frequency with single mid-freq with potentially small conductive component. 80% word interpretation at 70dB R; type A tympanogram; 84% AD WRT at 60 dB; Type A tymp    SNHL= Sensorineural hearing loss   Procedures:  None  Impression & Plans:  Melanie Garrett is a 68 y.o. female with bilateral symmetric downsloping SNHL (predominant) with R unilateral non-pulsatile intermittent tinnitus, not bothersome  B/l SNHL - discussed options including R/B/A including amplification - she wished to defer Unilateral tinnitus - for 2 months; WRT not significant different, but given unilateral nature we will do a follow up audio summer 2025; if persistent tinnitus, plan for MRI (she wished to defer for now)  - f/u summer 2025 with audio  Ear exam reassuring but some asymmetry F/u 3 months with MRI   MDM:  Level 4: 99204 I have personally spent 45 minutes involved in face-to-face and non-face-to-face activities for this patient on the day of the visit.  Professional time spent includes the following activities, in addition to those noted in the documentation: preparing to see the patient (review of outside documentation and results - audio), performing a medically appropriate examination and/or evaluation, counseling and educating the patient/family/caregiver, ordering audiogram, referring and communicating with other healthcare professionals, documenting clinical information in the electronic or other health record, independently interpreting  results and communicating results with the patient/family/caregiver (daughter).      Thank you for allowing me the opportunity to care for your patient. Please do not hesitate to contact me should you have any other questions.  Sincerely, Milon Aloe, MD Otolarynoglogist (ENT), [facility] Phone: [phone] Fax: [phone]  11/26/2023, 11:00 AM

## 2024-01-06 ENCOUNTER — Ambulatory Visit (HOSPITAL_COMMUNITY)
Admission: RE | Admit: 2024-01-06 | Discharge: 2024-01-06 | Disposition: A | Discharge status: Home / Self Care | Source: Ambulatory Visit | Attending: Otolaryngology | Admitting: Otolaryngology

## 2024-01-06 DIAGNOSIS — H9311 Tinnitus, right ear: Secondary | ICD-10-CM | POA: Insufficient documentation

## 2024-01-06 DIAGNOSIS — H903 Sensorineural hearing loss, bilateral: Secondary | ICD-10-CM | POA: Diagnosis not present

## 2024-01-06 DIAGNOSIS — H9191 Unspecified hearing loss, right ear: Secondary | ICD-10-CM | POA: Diagnosis not present

## 2024-01-06 MED ORDER — GADOBUTROL 1 MMOL/ML IV SOLN
8.0000 mL | Freq: Once | INTRAVENOUS | Status: AC | PRN
  Administered 2024-01-06: 8 mL via INTRAVENOUS

## 2024-01-12 ENCOUNTER — Telehealth: Payer: Self-pay | Admitting: *Deleted

## 2024-01-12 ENCOUNTER — Encounter: Payer: Self-pay | Admitting: *Deleted

## 2024-01-12 NOTE — Telephone Encounter (Signed)
 Patient advised we have completed her jury duty excuse letter. Patient advised it is at the front desk ready for pick up.

## 2024-02-03 NOTE — Progress Notes (Signed)
 Office Visit Note  Patient: Melanie Garrett             Date of Birth: 20-Oct-1955           MRN: [id number]             PCP: Joshua Debby CROME, MD Referring: Joshua Debby CROME, MD Visit Date: 02/17/2024 Occupation: @GUAROCC @  Subjective:  Medication monitoring  History of Present Illness: Melanie Garrett is a 68 y.o. female with history of seropositive rheumatoid arthritis and osteoarthritis.  Patient remains on Rinvoq  15 mg 1 tablet by mouth daily and arava  20 mg 1 tablet daily.  She is tolerating combination therapy without any side effects and has not had any recent gaps in therapy.  She denies any signs or symptoms of a rheumatoid arthritis flare.  She has not been experiencing any morning stiffness, nocturnal pain, or difficulty performing ADLs.  She experiences some swelling in her ankles which improves with elevation.  She denies any discomfort in the ankle joint at this time.  She denies any recent or recurrent infections.  She denies any questions or concerns at this time.    Activities of Daily Living:  Patient reports morning stiffness for 0 minutes.   Patient Denies nocturnal pain.  Difficulty dressing/grooming: Denies Difficulty climbing stairs: Denies Difficulty getting out of chair: Denies Difficulty using hands for taps, buttons, cutlery, and/or writing: Denies  Review of Systems  Constitutional:  Negative for fatigue.  HENT:  Negative for mouth sores and mouth dryness.   Eyes:  Negative for dryness.  Respiratory:  Negative for shortness of breath.   Cardiovascular:  Positive for swelling in legs/feet. Negative for chest pain and palpitations.  Gastrointestinal:  Negative for blood in stool, constipation and diarrhea.  Endocrine: Negative for increased urination.  Genitourinary:  Negative for involuntary urination.  Musculoskeletal:  Positive for joint pain and joint pain. Negative for gait problem, joint swelling, myalgias, muscle weakness, morning stiffness, muscle  tenderness and myalgias.  Skin:  Negative for color change, rash, hair loss and sensitivity to sunlight.  Allergic/Immunologic: Negative for susceptible to infections.  Neurological:  Negative for dizziness and headaches.  Hematological:  Negative for swollen glands.  Psychiatric/Behavioral:  Negative for depressed mood and sleep disturbance. The patient is not nervous/anxious.     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   Routine general medical examination at a health care facility 09/02/2016   Eczema, dyshidrotic 02/02/2015    Past Medical History:  Diagnosis Date   Osteoarthritis 02/09/2020   RA (rheumatoid arthritis) (HCC) 04/05/2021    Family History  Problem Relation Age of Onset   Arthritis Mother    Lupus Sister    Lupus Brother    Seizures Brother    Hypertension Daughter    Arthritis Daughter    Hypertension Daughter    Arthritis Daughter    Alcohol abuse Neg Hx    Cancer Neg Hx    Diabetes Neg Hx    Drug  abuse Neg Hx    Early death Neg Hx    Heart disease Neg Hx    Stroke Neg Hx    Past Surgical History:  Procedure Laterality Date   KNEE SURGERY Left    acl repair   Social History   Social History Narrative   Not on file   Immunization History  Administered Date(s) Administered   INFLUENZA, HIGH DOSE SEASONAL PF 03/27/2023   Influenza,inj,Quad PF,6+ Mos 06/29/2018, 03/08/2021   Influenza-Unspecified 02/23/2015,  03/24/2016, 03/24/2017   PNEUMOCOCCAL CONJUGATE-20 07/12/2021, 03/27/2023   Td 04/27/2010   Tdap 05/04/2013   Zoster Recombinant(Shingrix) 08/31/2022, 11/26/2022     Objective: Vital Signs: BP 124/78 (BP Location: Left Arm, Patient Position: Sitting, Cuff Size: Normal)   Pulse 71   Ht 5' 2 (1.575 m)   Wt 179 lb 6.4 oz (81.4 kg)   BMI 32.81 kg/m    Physical Exam Vitals and nursing note reviewed.  Constitutional:      Appearance: She is well-developed.  HENT:     Head: Normocephalic and atraumatic.  Eyes:     Conjunctiva/sclera: Conjunctivae normal.  Cardiovascular:     Rate and Rhythm: Normal rate and regular rhythm.     Heart sounds: Normal heart sounds.  Pulmonary:     Effort: Pulmonary effort is normal.     Breath sounds: Normal breath sounds.  Abdominal:     General: Bowel sounds are normal.     Palpations: Abdomen is soft.  Musculoskeletal:     Cervical back: Normal range of motion.  Lymphadenopathy:     Cervical: No cervical adenopathy.  Skin:    General: Skin is warm and dry.     Capillary Refill: Capillary refill takes less than 2 seconds.  Neurological:     Mental Status: She is alert and oriented to person, place, and time.  Psychiatric:        Behavior: Behavior normal.      Musculoskeletal Exam: C-spine has good range of motion.  Sternoclavicular joint thickening noted bilaterally.  Shoulder joints, elbow joints, wrist joints, MCPs, PIPs, DIPs have good range of motion with no synovitis.  Synovial thickening of MCP joints but no active synovitis noted.  Hip joints have good range of motion with no groin pain.  Knee joints have good range of motion no warmth or effusion.  Thickening of both ankles with pedal edema noted bilaterally.  No warmth or tenderness of the ankle joint noted.  CDAI Exam: CDAI Score: -- Patient Global: --; Provider Global: -- Swollen: --; Tender: -- Joint Exam 02/17/2024   No joint exam has been documented for this visit    There is currently no information documented on the homunculus. Go to the Rheumatology activity and complete the homunculus joint exam.  Investigation: No additional findings.  Imaging: No results found.   Recent Labs: Lab Results  Component Value Date   WBC 4.4 09/16/2023   HGB 10.4 (L) 09/16/2023   PLT 283 09/16/2023   NA 140 09/16/2023   K 4.3 09/16/2023   CL 107 09/16/2023   CO2 28 09/16/2023   GLUCOSE 85 09/16/2023   BUN 15 09/16/2023   CREATININE 0.62 09/16/2023   BILITOT 0.4 09/16/2023   ALKPHOS 66 03/08/2021   AST 30 09/16/2023   ALT 16 09/16/2023   PROT 6.2 09/16/2023   ALBUMIN 3.9 03/08/2021   CALCIUM 9.4 09/16/2023   QFTBGOLDPLUS NEGATIVE 05/20/2023    Speciality Comments: dxd 08/21 [facility] MTX, Arava -inadequate response.  Intermittent use  of Orencia  due to positive TB Gold, (stopped on 03/07/22), Actemra  started 03/14/22 Rinvoq  08/21/22 rifampin 08/2022X 4 months  Procedures:  No procedures performed Allergies: Patient has no known allergies.   Assessment / Plan:     Visit Diagnoses: Seropositive rheumatoid arthritis (HCC) - +RF,+CCP Ab - positive ANA, elevated sedimentation rate, severe synovitis.  DXd 08/21 at [facility]: She has no synovitis on examination today.  She has not had any signs or symptoms of a rheumatoid arthritis flare.  She has clinically been doing well taking Rinvoq  15 mg 1 tablet by mouth daily and Arava  20 mg 1 tablet by mouth daily.  She is tolerating combination therapy without any side effects and has not had any recent gaps in therapy.  No recent or recurrent infections.  She has not been experiencing any morning stiffness, nocturnal pain, or difficulty performing ADLs.  No medication changes will be made at this time.  A refill of Rinvoq  was sent to pharmacy today.  CBC, CMP, and lipid panel were obtained today to monitor for drug toxicity.  She will follow-up in the office in 5 months or sooner if needed.- Plan: Upadacitinib  ER  (RINVOQ ) 15 MG TB24  High risk medication use - Rinvoq  15 mg 1 tablet by mouth daily and arava  20 mg 1 tablet daily.  CBC and CMP updated on 09/16/23.  Orders for CBC and CMP released today.  She will return for updated lab work in November and every 3 months to monitor for drug toxicity. TB gold negative on 05/20/23. future order for TB Gold placed today. Lipid panel updated on 11/08/22.  Lipid panel obtained today. No recent or recurrent infections. Discussed the importance of holding rinvoq  and arava  if she develops signs or symptoms of an infection and to resume once the infection has completely cleared.   - Plan: CBC with Differential/Platelet, Comprehensive metabolic panel with GFR, QuantiFERON-TB Gold Plus, Upadacitinib  ER (RINVOQ ) 15 MG TB24, Lipid panel  TB lung, latent - treated with rifampin in August 2022 for 4 months for positive TB Gold. CXR 10/12/21 no active cardiopulmonary disease. TB gold- 10/1722 and May 20, 2023.  Future due for TB gold placed today.- Plan: QuantiFERON-TB Gold Plus  Dyslipidemia -Lipid panel obtained today.  plan: Lipid panel  Primary osteoarthritis of both hands: No tenderness or synovitis over MCP joints.  Complete fist formation bilaterally.  Primary osteoarthritis of both knees: She has good range of motion of both knee joints on examination today.  No warmth or effusion noted.  Primary osteoarthritis of both feet: Synovial thickening of the left ankle but no active synovitis noted.  Pedal edema noted bilateral lower extremities.  Other medical conditions are listed as follows:  Pedal edema  Urticaria, idiopathic  Intractable migraine without aura and without status migrainosus  Eczema, dyshidrotic  Family history of systemic lupus erythematosus, mother and brother     Orders: Orders Placed This Encounter  Procedures   CBC with Differential/Platelet   Comprehensive metabolic panel with GFR   QuantiFERON-TB Gold Plus   Lipid panel    Meds ordered this encounter  Medications   Upadacitinib  ER (RINVOQ ) 15 MG TB24    Sig: TAKE 1 TABLET BY MOUTH 1 TIME A DAY    Dispense:  90 tablet    Refill:  0    Follow-Up Instructions: Return in about 5 months (around 07/19/2024) for Rheumatoid arthritis, Osteoarthritis.   Waddell CHRISTELLA Craze, PA-C  Note - This record has been created using Dragon software.  Chart creation errors have been sought, but may not always  have been located. Such creation errors do not reflect on  the standard of medical care.

## 2024-02-10 ENCOUNTER — Other Ambulatory Visit: Payer: Self-pay | Admitting: Rheumatology

## 2024-02-10 NOTE — Telephone Encounter (Signed)
 Last Fill: 10/30/2023  Labs: 09/16/2023 CMP is normal.  Hemoglobin is low at 10.4.  Patient should take multivitamin with iron.   Next Visit: 02/17/2024  Last Visit: 09/16/2023  DX: Seropositive rheumatoid arthritis   Current Dose per office note on 09/16/2023: arava  20 mg 1 tablet daily.   Spoke with patient and her daughter and advised she is due to update labs. They advised she would update at the appointment on 02/17/2024. I have pended labs for the upcoming appointment.   Okay to refill 30 day supply of Arava  ?

## 2024-02-17 ENCOUNTER — Ambulatory Visit: Attending: Physician Assistant | Admitting: Physician Assistant

## 2024-02-17 ENCOUNTER — Encounter: Payer: Self-pay | Admitting: Physician Assistant

## 2024-02-17 VITALS — BP 124/78 | HR 71 | Ht 62.0 in | Wt 179.4 lb

## 2024-02-17 DIAGNOSIS — Z79899 Other long term (current) drug therapy: Secondary | ICD-10-CM

## 2024-02-17 DIAGNOSIS — M19072 Primary osteoarthritis, left ankle and foot: Secondary | ICD-10-CM

## 2024-02-17 DIAGNOSIS — M059 Rheumatoid arthritis with rheumatoid factor, unspecified: Secondary | ICD-10-CM | POA: Diagnosis not present

## 2024-02-17 DIAGNOSIS — G43019 Migraine without aura, intractable, without status migrainosus: Secondary | ICD-10-CM | POA: Diagnosis not present

## 2024-02-17 DIAGNOSIS — M19071 Primary osteoarthritis, right ankle and foot: Secondary | ICD-10-CM

## 2024-02-17 DIAGNOSIS — L301 Dyshidrosis [pompholyx]: Secondary | ICD-10-CM

## 2024-02-17 DIAGNOSIS — M170 Bilateral primary osteoarthritis of knee: Secondary | ICD-10-CM

## 2024-02-17 DIAGNOSIS — L501 Idiopathic urticaria: Secondary | ICD-10-CM

## 2024-02-17 DIAGNOSIS — M19041 Primary osteoarthritis, right hand: Secondary | ICD-10-CM | POA: Diagnosis not present

## 2024-02-17 DIAGNOSIS — R600 Localized edema: Secondary | ICD-10-CM | POA: Diagnosis not present

## 2024-02-17 DIAGNOSIS — Z227 Latent tuberculosis: Secondary | ICD-10-CM

## 2024-02-17 DIAGNOSIS — M19042 Primary osteoarthritis, left hand: Secondary | ICD-10-CM

## 2024-02-17 DIAGNOSIS — Z8269 Family history of other diseases of the musculoskeletal system and connective tissue: Secondary | ICD-10-CM

## 2024-02-17 DIAGNOSIS — E785 Hyperlipidemia, unspecified: Secondary | ICD-10-CM | POA: Diagnosis not present

## 2024-02-17 LAB — LIPID PANEL
Cholesterol: 205 mg/dL — ABNORMAL HIGH (ref <200)
HDL: 48 mg/dL — ABNORMAL LOW (ref >=50)
LDL Cholesterol (Calc): 134 mg/dL — ABNORMAL HIGH
Non-HDL Cholesterol (Calc): 157 mg/dL — ABNORMAL HIGH (ref <130)
Total CHOL/HDL Ratio: 4.3 (calc) (ref <5.0)
Triglycerides: 121 mg/dL (ref <150)

## 2024-02-17 LAB — CBC WITH DIFFERENTIAL/PLATELET
Absolute Lymphocytes: 2220 {cells}/uL (ref 850–3900)
Absolute Monocytes: 598 {cells}/uL (ref 200–950)
Basophils Absolute: 69 {cells}/uL (ref 0–200)
Basophils Relative: 1.4 %
Eosinophils Absolute: 162 {cells}/uL (ref 15–500)
Eosinophils Relative: 3.3 %
HCT: 34.3 % — ABNORMAL LOW (ref 35.0–45.0)
Hemoglobin: 10.6 g/dL — ABNORMAL LOW (ref 11.7–15.5)
MCH: 27.7 pg (ref 27.0–33.0)
MCHC: 30.9 g/dL — ABNORMAL LOW (ref 32.0–36.0)
MCV: 89.6 fL (ref 80.0–100.0)
MPV: 10.7 fL (ref 7.5–12.5)
Monocytes Relative: 12.2 %
Neutro Abs: 1852 {cells}/uL (ref 1500–7800)
Neutrophils Relative %: 37.8 %
Platelets: 276 Thousand/uL (ref 140–400)
RBC: 3.83 Million/uL (ref 3.80–5.10)
RDW: 13.3 % (ref 11.0–15.0)
Total Lymphocyte: 45.3 %
WBC: 4.9 Thousand/uL (ref 3.8–10.8)

## 2024-02-17 LAB — COMPREHENSIVE METABOLIC PANEL WITH GFR
AG Ratio: 1.5 (calc) (ref 1.0–2.5)
ALT: 15 U/L (ref 6–29)
AST: 26 U/L (ref 10–35)
Albumin: 3.8 g/dL (ref 3.6–5.1)
Alkaline phosphatase (APISO): 66 U/L (ref 37–153)
BUN: 14 mg/dL (ref 7–25)
CO2: 28 mmol/L (ref 20–32)
Calcium: 9.3 mg/dL (ref 8.6–10.4)
Chloride: 107 mmol/L (ref 98–110)
Creat: 0.68 mg/dL (ref 0.50–1.05)
Globulin: 2.6 g/dL (ref 1.9–3.7)
Glucose, Bld: 78 mg/dL (ref 65–99)
Potassium: 3.9 mmol/L (ref 3.5–5.3)
Sodium: 141 mmol/L (ref 135–146)
Total Bilirubin: 0.4 mg/dL (ref 0.2–1.2)
Total Protein: 6.4 g/dL (ref 6.1–8.1)
eGFR: 95 mL/min/1.73m2 (ref >=60)

## 2024-02-17 MED ORDER — RINVOQ 15 MG PO TB24: ORAL_TABLET | ORAL | 0 refills | Status: DC

## 2024-02-17 NOTE — Patient Instructions (Addendum)
 Standing Labs We placed an order today for your standing lab work.   Please have your standing labs drawn in November and every 3 months   Please have your labs drawn 2 weeks prior to your appointment so that the provider can discuss your lab results at your appointment, if possible.  Please note that you may see your imaging and lab results in MyChart before we have reviewed them. We will contact you once all results are reviewed. Please allow our office up to 72 hours to thoroughly review all of the results before contacting the office for clarification of your results.  WALK-IN LAB HOURS  Monday through Thursday from 8:00 am -12:30 pm and 1:00 pm-4:30 pm and Friday from 8:00 am-12:00 pm.  Patients with office visits requiring labs will be seen before walk-in labs.  You may encounter longer than normal wait times. Please allow additional time. Wait times may be shorter on  Monday and Thursday afternoons.  We do not book appointments for walk-in labs. We appreciate your patience and understanding with our staff.   Labs are drawn by [facility]. Please bring your co-pay at the time of your lab draw.  You may receive a bill from [facility] for your lab work.  Please note if you are on Hydroxychloroquine and and an order has been placed for a Hydroxychloroquine level,  you will need to have it drawn 4 hours or more after your last dose.  If you wish to have your labs drawn at another location, please call the office 24 hours in advance so we can fax the orders.  The office is located at [address], [place], [place] [address]   If you have any questions regarding directions or hours of operation,  please call [phone].   As a reminder, please drink plenty of water prior to coming for your lab work. Thanks! She is

## 2024-02-18 ENCOUNTER — Ambulatory Visit: Payer: Self-pay | Admitting: Physician Assistant

## 2024-02-18 NOTE — Progress Notes (Signed)
 Hemoglobin and hematocrit remain low but have improved. Rest of CBC WNL.   CMP WNL Total cholesterol is elevated-205. HDL low-48 and LDL elevated at 134.  Please notify the patient and forward results to PCP.

## 2024-02-24 ENCOUNTER — Ambulatory Visit

## 2024-03-03 ENCOUNTER — Ambulatory Visit (INDEPENDENT_AMBULATORY_CARE_PROVIDER_SITE_OTHER): Admitting: Otolaryngology

## 2024-03-03 ENCOUNTER — Encounter (INDEPENDENT_AMBULATORY_CARE_PROVIDER_SITE_OTHER): Payer: Self-pay | Admitting: Otolaryngology

## 2024-03-03 VITALS — BP 141/82 | HR 87 | Ht 62.0 in | Wt 180.0 lb

## 2024-03-03 DIAGNOSIS — H9311 Tinnitus, right ear: Secondary | ICD-10-CM

## 2024-03-03 DIAGNOSIS — H903 Sensorineural hearing loss, bilateral: Secondary | ICD-10-CM

## 2024-03-03 NOTE — Progress Notes (Signed)
 Dear Dr. Joshua, Here is my assessment for our mutual patient, Melanie Garrett. Thank you for allowing me the opportunity to care for your patient. Please do not hesitate to contact me should you have any other questions. Sincerely, Dr. Eldora Blanch  Otolaryngology Clinic Note Referring provider: Dr. Joshua HPI:  Melanie Garrett is a 68 y.o. female kindly referred by Dr. 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 She saw Melanie Garrett, Melanie Garrett for audio (see below).  Initial visit: Her primary complaint was the tinnitus, and the HL was found incidentally. She reports that it is intermittent tinnitus in the right ear. Few times a week, couple of minutes. Couple of months. No subjective difficulty hearing, no pain/pressure/drainage/vertigo/popping/crackling/deep pain/autophony/medication use such as aspirin. It is not bothersome. She denies significant noise exposure, barotrauma, vestibular suppressants. Low pitched, just buzzing, not pulsatile.  No ear surgery or problems or infections in the past.   No prior CT or MRI.  --------------------------------------------------------- 11/26/2023 Seen in follow up. Persistent tinnitus and HL. Otherwise no ear pain, drainage, vertigo, facial weakness or numbness.  --------------------------------------------------------- 03/03/2024 Seen in follow up. She reports that her hearing is about the same and the tinnitus persists, though perhaps not as bad. No pain, drainage, vertigo, facial weakness or numbness. We went over her MRI.   PMHx: Rheumatoid arthritis (on rinvoq , arava ), prior latent treated TB.   PMH/Meds/All/SocHx/FamHx/ROS:   Past Medical History:  Diagnosis Date   Osteoarthritis 02/09/2020   RA (rheumatoid arthritis) (HCC) 04/05/2021   Past Surgical History:  Procedure Laterality Date   KNEE SURGERY Left    acl repair   Family History  Problem Relation Age of Onset   Arthritis Mother    Lupus Sister    Lupus Brother     Seizures Brother    Hypertension Daughter    Arthritis Daughter    Hypertension Daughter    Arthritis Daughter    Alcohol abuse Neg Hx    Cancer Neg Hx    Diabetes Neg Hx    Drug abuse Neg Hx    Early death Neg Hx    Heart disease Neg Hx    Stroke Neg Hx    Social Connections: Unknown (11/01/2021)   Received from [employer]   Social Network    Social Network: Not on file      Current Outpatient Medications:    leflunomide  (ARAVA ) 20 MG tablet, TAKE 1 TABLET BY MOUTH EVERY DAY, Disp: 30 tablet, Rfl: 0   Upadacitinib  ER (RINVOQ ) 15 MG TB24, TAKE 1 TABLET BY MOUTH 1 TIME A DAY, Disp: 90 tablet, Rfl: 0   VITAMIN D PO, Take by mouth as needed., Disp: , Rfl:    benzonatate  (TESSALON  PERLES) 100 MG capsule, Take 2 capsules (200 mg total) by mouth 3 (three) times daily as needed for cough. (Patient not taking: Reported on 03/03/2024), Disp: 20 capsule, Rfl: 0   conjugated estrogens  (PREMARIN ) vaginal cream, Place vaginally daily. Place 0.5 g intravaginally once daily for two weeks, then reduce to twice per week. (Patient not taking: Reported on 03/03/2024), Disp: 29.96 g, Rfl: 2   Cyanocobalamin (VITAMIN B-12 PO), Take by mouth as needed. (Patient not taking: Reported on 03/03/2024), Disp: , Rfl:    folic acid  (FOLVITE ) 1 MG tablet, Take 1 mg by mouth daily. (Patient not taking: Reported on 03/03/2024), Disp: , Rfl:    potassium chloride  SA (KLOR-CON ) 20 MEQ tablet, Take 1 tablet (20 mEq total) by mouth daily. (Patient not taking: Reported on  03/03/2024), Disp: 90 tablet, Rfl: 0   traMADol  (ULTRAM ) 50 MG tablet, Take 1 tablet (50 mg total) by mouth every 6 (six) hours as needed. (Patient not taking: Reported on 03/03/2024), Disp: 75 tablet, Rfl: 3   Ubrogepant  (UBRELVY ) 100 MG TABS, Take 1 tablet by mouth daily as needed. (Patient not taking: Reported on 03/03/2024), Disp: 30 tablet, Rfl: 1    Physical Exam:   There were no vitals taken for this visit.   Salient findings:  CN II-XII intact;  no numbness preauricularly Bilateral EAC clear and TM intact with well pneumatized middle ear spaces; minimal myringosclerosis centrally on right, stable Weber 512: midline Rinne 512: AC > BC b/l  Rine 1024: AC > BC b/l  No respiratory distress or stridor  Independent Review of Additional Tests or Records:  03/27/2023 Audiogram was independently reviewed and interpreted by me and it reveals Bilateral sloping SNHL with borderline thresholds at lower frequency with single mid-freq with potentially small conductive component. 80% word interpretation at 70dB R; type A tympanogram; 84% AD WRT at 60 dB; Type A tymp    SNHL= Sensorineural hearing loss  11/2023 Audiogram was independently reviewed and interpreted by me and it reveals: A/A tymps Pure tone Audiometry: Right ear- Borderline normal to moderate sensorineural hearing loss from 125 Hz - 8000 Hz. Left ear-  Normal hearing from [phone] Hz, then mild presumably  sensorineural hearing loss from 6000 Hz - 8000 Hz.   Speech Audiometry: Right ear- Speech Reception Threshold (SRT) was obtained at 25 dBHL. Left ear-Speech Reception Threshold (SRT) was obtained at 20 dBHL.   Word Recognition Score Tested using NU-6 (MLV) Right ear: 80% was obtained at a presentation level of 60 dBHL with contralateral masking which is deemed as  good . Left ear: 84% was obtained at a presentation level of 60 dBHL with contralateral masking which is deemed as  good .   SNHL= Sensorineural hearing loss  MRI IAC 01/06/2024 independently interpreted: b/l mastoids and ME well aerated; no noted retrocochlear lesions  Procedures:  None  Impression & Plans:  Melanie Garrett is a 68 y.o. female with bilateral symmetric downsloping SNHL (predominant) with R unilateral non-pulsatile intermittent tinnitus, not bothersome  B/l SNHL - discussed options including R/B/A including amplification - she again wished to defer. Will observe Unilateral tinnitus -now for over 6  months; WRT not significant different; MRI without lesions, would recommend masking strategies  F/u PRN   Thank you for allowing me the opportunity to care for your patient. Please do not hesitate to contact me should you have any other questions.  Sincerely, Eldora Blanch, MD Otolaryngologist (ENT), [facility] Phone: [phone] Fax: [phone]  03/03/2024, 9:46 AM   MDM:  Level 3: 99214 Complexity/Problems addressed: mod - multiple chronic problems Data complexity: mod - independent MRI/imaging interpretation - Morbidity: low  - Prescription Drug prescribed or managed: no

## 2024-03-06 ENCOUNTER — Other Ambulatory Visit: Payer: Self-pay | Admitting: Rheumatology

## 2024-03-08 NOTE — Telephone Encounter (Signed)
 Last Fill: 02/10/2024 (30 day supply)  Labs: 02/17/2024 Hemoglobin and hematocrit remain low but have improved. Rest of CBC WNL.   CMP WNL Total cholesterol is elevated-205. HDL low-48 and LDL elevated at 134.    Next Visit: 07/22/2024  Last Visit: 02/17/2024  DX: Seropositive rheumatoid arthritis   Current Dose per office note on 02/17/2024: arava  20 mg 1 tablet daily.   Okay to refill Arava ?

## 2024-04-28 ENCOUNTER — Other Ambulatory Visit: Payer: Self-pay | Admitting: Physician Assistant

## 2024-04-28 DIAGNOSIS — M059 Rheumatoid arthritis with rheumatoid factor, unspecified: Secondary | ICD-10-CM

## 2024-04-28 DIAGNOSIS — Z79899 Other long term (current) drug therapy: Secondary | ICD-10-CM

## 2024-04-28 NOTE — Telephone Encounter (Signed)
 Last Fill: 02/17/2024  Labs: 02/17/2024 Hemoglobin and hematocrit remain low but have improved. Rest of CBC WNL.   CMP WNL Total cholesterol is elevated-205. HDL low-48 and LDL elevated at 134.  Please notify the patient and forward results to PCP.  TB Gold: 05/20/2023 Negative   Next Visit: 07/22/2024  Last Visit: 02/17/2024  IK:Dzmnendpupcz rheumatoid arthritis (HCC)   Current Dose per office note 02/17/2024: Rinvoq  15 mg 1 tablet by mouth daily   Okay to refill Rinvoq ?

## 2024-04-29 DIAGNOSIS — Z5919 Other inadequate housing: Secondary | ICD-10-CM | POA: Diagnosis not present

## 2024-04-29 DIAGNOSIS — Z5948 Other specified lack of adequate food: Secondary | ICD-10-CM | POA: Diagnosis not present

## 2024-04-29 DIAGNOSIS — M069 Rheumatoid arthritis, unspecified: Secondary | ICD-10-CM | POA: Diagnosis not present

## 2024-04-29 DIAGNOSIS — Z79622 Long term (current) use of janus kinase inhibitor: Secondary | ICD-10-CM | POA: Diagnosis not present

## 2024-04-29 DIAGNOSIS — E669 Obesity, unspecified: Secondary | ICD-10-CM | POA: Diagnosis not present

## 2024-04-29 DIAGNOSIS — Z6832 Body mass index (BMI) 32.0-32.9, adult: Secondary | ICD-10-CM | POA: Diagnosis not present

## 2024-04-29 DIAGNOSIS — N181 Chronic kidney disease, stage 1: Secondary | ICD-10-CM | POA: Diagnosis not present

## 2024-04-29 DIAGNOSIS — D84821 Immunodeficiency due to drugs: Secondary | ICD-10-CM | POA: Diagnosis not present

## 2024-05-13 ENCOUNTER — Ambulatory Visit (INDEPENDENT_AMBULATORY_CARE_PROVIDER_SITE_OTHER)

## 2024-05-13 VITALS — Ht 62.0 in | Wt 180.0 lb

## 2024-05-13 DIAGNOSIS — Z0000 Encounter for general adult medical examination without abnormal findings: Secondary | ICD-10-CM

## 2024-05-13 DIAGNOSIS — Z1212 Encounter for screening for malignant neoplasm of rectum: Secondary | ICD-10-CM

## 2024-05-13 DIAGNOSIS — Z780 Asymptomatic menopausal state: Secondary | ICD-10-CM | POA: Diagnosis not present

## 2024-05-13 NOTE — Patient Instructions (Signed)
 Melanie Garrett,  Thank you for taking the time for your Medicare Wellness Visit. I appreciate your continued commitment to your health goals. Please review the care plan we discussed, and feel free to reach out if I can assist you further.  Please note that Annual Wellness Visits do not include a physical exam. Some assessments may be limited, especially if the visit was conducted virtually. If needed, we may recommend an in-person follow-up with your provider.  Ongoing Care Seeing your primary care provider every 3 to 6 months helps us  monitor your health and provide consistent, personalized care. Next office visit on 06/02/2024.  You are due for a flu vaccine and can get that done at your local pharmacy.    Referrals If a referral was made during today's visit and you haven't received any updates within two weeks, please contact the referred provider directly to check on the status.  You have an order for:  [x]   Bone Density     Please call for appointment:  [facility] [address] [place], [place] [address] [phone]  Make sure to wear two-piece clothing.  No lotions, powders, or deodorants the day of the appointment. Make sure to bring picture ID and insurance card.  Bring list of medications you are currently taking including any supplements.    Recommended Screenings:  Health Maintenance  Topic Date Due   Medicare Annual Wellness Visit  Never done   COVID-19 Vaccine (1) Never done   Osteoporosis screening with Bone Density Scan  Never done   DTaP/Tdap/Td vaccine (3 - Td or Tdap) 05/05/2023   Cologuard (Stool DNA test)  10/18/2023   Flu Shot  01/23/2024   Breast Cancer Screening  09/11/2025   Pneumococcal Vaccine for age over 34  Completed   Hepatitis C Screening  Completed   Zoster (Shingles) Vaccine  Completed   Meningitis B Vaccine  Aged Out   Colon Cancer Screening  Discontinued       05/13/2024   11:26 AM  Advanced Directives  Does Patient  Have a Medical Advance Directive? No    Vision: Annual vision screenings are recommended for early detection of glaucoma, cataracts, and diabetic retinopathy. These exams can also reveal signs of chronic conditions such as diabetes and high blood pressure.  Dental: Annual dental screenings help detect early signs of oral cancer, gum disease, and other conditions linked to overall health, including heart disease and diabetes.  Please see the attached documents for additional preventive care recommendations.

## 2024-05-13 NOTE — Progress Notes (Signed)
 Chief Complaint  Patient presents with   Medicare Wellness     Subjective:   Melanie Garrett is a 68 y.o. female who presents for a Medicare Annual Wellness Visit.  Allergies (verified) Patient has no known allergies.   History: Past Medical History:  Diagnosis Date   Osteoarthritis 02/09/2020   RA (rheumatoid arthritis) (HCC) 04/05/2021   Past Surgical History:  Procedure Laterality Date   KNEE SURGERY Left    acl repair   Family History  Problem Relation Age of Onset   Arthritis Mother    Lupus Sister    Lupus Brother    Seizures Brother    Hypertension Daughter    Arthritis Daughter    Hypertension Daughter    Arthritis Daughter    Alcohol abuse Neg Hx    Cancer Neg Hx    Diabetes Neg Hx    Drug abuse Neg Hx    Early death Neg Hx    Heart disease Neg Hx    Stroke Neg Hx    Social History   Occupational History   Occupation: RETIRED  Tobacco Use   Smoking status: Never    Passive exposure: Never   Smokeless tobacco: Never  Vaping Use   Vaping status: Never Used  Substance and Sexual Activity   Alcohol use: No   Drug use: No   Sexual activity: Yes    Birth control/protection: Post-menopausal   Tobacco Counseling Counseling given: Not Answered  SDOH Screenings   Food Insecurity: Food Insecurity Present (05/13/2024)  Housing: Unknown (05/13/2024)  Transportation Needs: No Transportation Needs (05/13/2024)  Utilities: Not At Risk (05/13/2024)  Depression (PHQ2-9): Low Risk  (05/13/2024)  Physical Activity: Insufficiently Active (05/13/2024)  Social Connections: Socially Isolated (05/13/2024)  Stress: No Stress Concern Present (05/13/2024)  Tobacco Use: Low Risk  (05/13/2024)  Health Literacy: Adequate Health Literacy (05/13/2024)   See flowsheets for full screening details  Depression Screen PHQ 2 & 9 Depression Scale- Over the past 2 weeks, how often have you been bothered by any of the following problems? Little interest or pleasure in  doing things: 0 Feeling down, depressed, or hopeless (PHQ Adolescent also includes...irritable): 0 PHQ-2 Total Score: 0 Trouble falling or staying asleep, or sleeping too much: 0 Feeling tired or having little energy: 0 Poor appetite or overeating (PHQ Adolescent also includes...weight loss): 0 Feeling bad about yourself - or that you are a failure or have let yourself or your family down: 0 Trouble concentrating on things, such as reading the newspaper or watching television (PHQ Adolescent also includes...like school work): 0 Moving or speaking so slowly that other people could have noticed. Or the opposite - being so fidgety or restless that you have been moving around a lot more than usual: 0 Thoughts that you would be better off dead, or of hurting yourself in some way: 0 PHQ-9 Total Score: 0 If you checked off any problems, how difficult have these problems made it for you to do your work, take care of things at home, or get along with other people?: Not difficult at all  Depression Treatment Depression Interventions/Treatment : EYV7-0 Score <4 Follow-up Not Indicated     Goals Addressed             This Visit's Progress    Patient Stated       Not at this time/2025       Visit info / Clinical Intake: Medicare Wellness Visit Type:: Initial Annual Wellness Visit Persons participating in visit::  patient Medicare Wellness Visit Mode:: Telephone If telephone:: video declined Because this visit was a virtual/telehealth visit:: vitals recorded from last visit If Telephone or Video please confirm:: I connected with the patient using audio enabled telemedicine application and verified that I am speaking with the correct person using two identifiers; I discussed the limitations of evaluation and management by telemedicine; The patient expressed understanding and agreed to proceed Patient Location:: Home Provider Location:: Home Information given by:: patient Interpreter Needed?:  No Pre-visit prep was completed: no AWV questionnaire completed by patient prior to visit?: no Living arrangements:: with family/others Patient's Overall Health Status Rating: very good Typical amount of pain: none Does pain affect daily life?: no Are you currently prescribed opioids?: no  Dietary Habits and Nutritional Risks How many meals a day?: 2 Eats fruit and vegetables daily?: yes (fruit everyday) Most meals are obtained by: preparing own meals In the last 2 weeks, have you had any of the following?: none Diabetic:: no  Functional Status Activities of Daily Living (to include ambulation/medication): Independent Ambulation: Independent with device- listed below Home Assistive Devices/Equipment: Eyeglasses Medication Administration: Independent Home Management: Independent Manage your own finances?: yes Primary transportation is: family/friends (daughter drives her) Concerns about vision?: no *vision screening is required for WTM* Concerns about hearing?: no  Fall Screening Falls in the past year?: 0 Number of falls in past year: 0 Was there an injury with Fall?: 0 Fall Risk Category Calculator: 0 Patient Fall Risk Level: Low Fall Risk  Fall Risk Patient at Risk for Falls Due to: No Fall Risks Fall risk Follow up: Falls evaluation completed; Falls prevention discussed  Home and Transportation Safety: All rugs have non-skid backing?: N/A, no rugs All stairs or steps have railings?: N/A, no stairs Grab bars in the bathtub or shower?: (!) no Have non-skid surface in bathtub or shower?: yes Good home lighting?: yes Regular seat belt use?: yes Hospital stays in the last year:: no  Cognitive Assessment Difficulty concentrating, remembering, or making decisions? : no Will 6CIT or Mini Cog be Completed: no 6CIT or Mini Cog Declined: patient alert, oriented, able to answer questions appropriately and recall recent events  Advance Directives (For Healthcare) Does  Patient Have a Medical Advance Directive?: No  Reviewed/Updated  Reviewed/Updated: Reviewed All (Medical, Surgical, Family, Medications, Allergies, Care Teams, Patient Goals)        Objective:    Today's Vitals   05/13/24 1123  Weight: 180 lb (81.6 kg)  Height: 5' 2 (1.575 m)   Body mass index is 32.92 kg/m.  Current Medications (verified) Outpatient Encounter Medications as of 05/13/2024  Medication Sig   leflunomide  (ARAVA ) 20 MG tablet TAKE 1 TABLET BY MOUTH EVERY DAY   RINVOQ  15 MG TB24 TAKE 1 TABLET BY MOUTH 1 TIME A DAY   VITAMIN D PO Take by mouth as needed.   benzonatate  (TESSALON  PERLES) 100 MG capsule Take 2 capsules (200 mg total) by mouth 3 (three) times daily as needed for cough. (Patient not taking: Reported on 05/13/2024)   conjugated estrogens  (PREMARIN ) vaginal cream Place vaginally daily. Place 0.5 g intravaginally once daily for two weeks, then reduce to twice per week. (Patient not taking: Reported on 05/13/2024)   Cyanocobalamin (VITAMIN B-12 PO) Take by mouth as needed. (Patient not taking: Reported on 05/13/2024)   folic acid  (FOLVITE ) 1 MG tablet Take 1 mg by mouth daily. (Patient not taking: Reported on 05/13/2024)   potassium chloride  SA (KLOR-CON ) 20 MEQ tablet Take 1 tablet (20 mEq  total) by mouth daily. (Patient not taking: Reported on 05/13/2024)   traMADol  (ULTRAM ) 50 MG tablet Take 1 tablet (50 mg total) by mouth every 6 (six) hours as needed. (Patient not taking: Reported on 05/13/2024)   Ubrogepant  (UBRELVY ) 100 MG TABS Take 1 tablet by mouth daily as needed. (Patient not taking: Reported on 05/13/2024)   No facility-administered encounter medications on file as of 05/13/2024.   Hearing/Vision screen Hearing Screening - Comments:: Denies hearing difficulties   Vision Screening - Comments:: Wears eyeglasses/[employer]/per pt-UTD Immunizations and Health Maintenance Health Maintenance  Topic Date Due   Medicare Annual Wellness (AWV)   Never done   COVID-19 Vaccine (1) Never done   Bone Density Scan  Never done   DTaP/Tdap/Td (3 - Td or Tdap) 05/05/2023   Fecal DNA (Cologuard)  10/18/2023   Influenza Vaccine  01/23/2024   Mammogram  09/11/2025   Pneumococcal Vaccine: 50+ Years  Completed   Hepatitis C Screening  Completed   Zoster Vaccines- Shingrix  Completed   Meningococcal B Vaccine  Aged Out   Colonoscopy  Discontinued        Assessment/Plan:  This is a routine wellness examination for Melanie Garrett.  Patient Care Team: Joshua Debby CROME, MD as PCP - General  I have personally reviewed and noted the following in the patient's chart:   Medical and social history Use of alcohol, tobacco or illicit drugs  Current medications and supplements including opioid prescriptions. Functional ability and status Nutritional status Physical activity Advanced directives List of other physicians Hospitalizations, surgeries, and ER visits in previous 12 months Vitals Screenings to include cognitive, depression, and falls Referrals and appointments  Orders Placed This Encounter  Procedures   DG Bone Density    Standing Status:   Future    Expiration Date:   05/13/2025    Reason for Exam (SYMPTOM  OR DIAGNOSIS REQUIRED):   post menopausal    Preferred imaging location?:   External    Call Results- Best Contact Number?:   [facility]   In addition, I have reviewed and discussed with patient certain preventive protocols, quality metrics, and best practice recommendations. A written personalized care plan for preventive services as well as general preventive health recommendations were provided to patient.   Melanie Garrett, CMA   05/13/2024   No follow-ups on file.  After Visit Summary: (MyChart) Due to this being a telephonic visit, the after visit summary with patients personalized plan was offered to patient via MyChart   Nurse Notes: Patient is due for a Flu vaccine.  She is also due for a DEXA and a Cologuard, which  orders have been placed for both today.  Patient is schedule for a CPE in December 2025.  She had no other concerns to address today.

## 2024-05-24 ENCOUNTER — Other Ambulatory Visit: Payer: Self-pay | Admitting: Physician Assistant

## 2024-05-24 DIAGNOSIS — Z79899 Other long term (current) drug therapy: Secondary | ICD-10-CM

## 2024-05-24 DIAGNOSIS — M059 Rheumatoid arthritis with rheumatoid factor, unspecified: Secondary | ICD-10-CM

## 2024-05-25 NOTE — Telephone Encounter (Signed)
 Last Fill: 02/17/2024   Labs: 02/17/2024 Hemoglobin and hematocrit remain low but have improved. Rest of CBC WNL.   CMP WNL Total cholesterol is elevated-205. HDL low-48 and LDL elevated at 134.  Please notify the patient and forward results to PCP.   TB Gold: 05/20/2023 Negative    Next Visit: 07/22/2024   Last Visit: 02/17/2024   IK:Dzmnendpupcz rheumatoid arthritis    Current Dose per office note 02/17/2024: Rinvoq  15 mg 1 tablet by mouth daily   Patient advised she is due to update her lab work. Patient will try to come Thursday to update.    Okay to refill Rinvoq ?

## 2024-05-27 ENCOUNTER — Other Ambulatory Visit: Payer: Self-pay | Admitting: *Deleted

## 2024-05-27 DIAGNOSIS — Z227 Latent tuberculosis: Secondary | ICD-10-CM

## 2024-05-27 DIAGNOSIS — Z79899 Other long term (current) drug therapy: Secondary | ICD-10-CM

## 2024-05-28 ENCOUNTER — Ambulatory Visit: Payer: Self-pay | Admitting: Rheumatology

## 2024-05-28 NOTE — Progress Notes (Signed)
 CBC and CMP are stable.

## 2024-05-30 LAB — COMPREHENSIVE METABOLIC PANEL WITH GFR
AG Ratio: 1.5 (calc) (ref 1.0–2.5)
ALT: 12 U/L (ref 6–29)
AST: 26 U/L (ref 10–35)
Albumin: 4 g/dL (ref 3.6–5.1)
Alkaline phosphatase (APISO): 60 U/L (ref 37–153)
BUN: 12 mg/dL (ref 7–25)
CO2: 26 mmol/L (ref 20–32)
Calcium: 9.4 mg/dL (ref 8.6–10.4)
Chloride: 105 mmol/L (ref 98–110)
Creat: 0.62 mg/dL (ref 0.50–1.05)
Globulin: 2.7 g/dL (ref 1.9–3.7)
Glucose, Bld: 109 mg/dL — ABNORMAL HIGH (ref 65–99)
Potassium: 3.8 mmol/L (ref 3.5–5.3)
Sodium: 139 mmol/L (ref 135–146)
Total Bilirubin: 0.6 mg/dL (ref 0.2–1.2)
Total Protein: 6.7 g/dL (ref 6.1–8.1)
eGFR: 98 mL/min/1.73m2 (ref >=60)

## 2024-05-30 LAB — QUANTIFERON-TB GOLD PLUS
Mitogen-NIL: 2.55 [IU]/mL
NIL: 0.01 [IU]/mL
QuantiFERON-TB Gold Plus: NEGATIVE
TB1-NIL: 0.01 [IU]/mL
TB2-NIL: 0.01 [IU]/mL

## 2024-05-30 LAB — CBC WITH DIFFERENTIAL/PLATELET
Absolute Lymphocytes: 1833 {cells}/uL (ref 850–3900)
Absolute Monocytes: 371 {cells}/uL (ref 200–950)
Basophils Absolute: 52 {cells}/uL (ref 0–200)
Basophils Relative: 0.9 %
Eosinophils Absolute: 162 {cells}/uL (ref 15–500)
Eosinophils Relative: 2.8 %
HCT: 35 % — ABNORMAL LOW (ref 35.9–46.0)
Hemoglobin: 11.2 g/dL — ABNORMAL LOW (ref 11.7–15.5)
MCH: 28.4 pg (ref 27.0–33.0)
MCHC: 32 g/dL (ref 31.6–35.4)
MCV: 88.8 fL (ref 81.4–101.7)
MPV: 10.6 fL (ref 7.5–12.5)
Monocytes Relative: 6.4 %
Neutro Abs: 3381 {cells}/uL (ref 1500–7800)
Neutrophils Relative %: 58.3 %
Platelets: 290 Thousand/uL (ref 140–400)
RBC: 3.94 Million/uL (ref 3.80–5.10)
RDW: 13.3 % (ref 11.0–15.0)
Total Lymphocyte: 31.6 %
WBC: 5.8 Thousand/uL (ref 3.8–10.8)

## 2024-05-31 NOTE — Progress Notes (Signed)
 TB gold negative

## 2024-06-02 ENCOUNTER — Ambulatory Visit: Admitting: Internal Medicine

## 2024-06-02 ENCOUNTER — Encounter: Payer: Self-pay | Admitting: Internal Medicine

## 2024-06-02 VITALS — BP 148/82 | HR 71 | Temp 97.9°F | Resp 16 | Ht 62.0 in | Wt 179.8 lb

## 2024-06-02 DIAGNOSIS — E785 Hyperlipidemia, unspecified: Secondary | ICD-10-CM | POA: Insufficient documentation

## 2024-06-02 DIAGNOSIS — D539 Nutritional anemia, unspecified: Secondary | ICD-10-CM | POA: Insufficient documentation

## 2024-06-02 DIAGNOSIS — I10 Essential (primary) hypertension: Secondary | ICD-10-CM | POA: Insufficient documentation

## 2024-06-02 DIAGNOSIS — R9431 Abnormal electrocardiogram [ECG] [EKG]: Secondary | ICD-10-CM | POA: Insufficient documentation

## 2024-06-02 DIAGNOSIS — Z1211 Encounter for screening for malignant neoplasm of colon: Secondary | ICD-10-CM

## 2024-06-02 DIAGNOSIS — Z23 Encounter for immunization: Secondary | ICD-10-CM | POA: Insufficient documentation

## 2024-06-02 LAB — IBC + FERRITIN
Ferritin: 223 ng/mL (ref 10.0–291.0)
Iron: 81 ug/dL (ref 42–145)
Saturation Ratios: 22.5 % (ref 20.0–50.0)
TIBC: 359.8 ug/dL (ref 250.0–450.0)
Transferrin: 257 mg/dL (ref 212.0–360.0)

## 2024-06-02 LAB — TSH: TSH: 1.54 u[IU]/mL (ref 0.35–5.50)

## 2024-06-02 LAB — VITAMIN B12: Vitamin B-12: 304 pg/mL (ref 211–911)

## 2024-06-02 LAB — TROPONIN I (HIGH SENSITIVITY): High Sens Troponin I: 5 ng/L (ref 2–17)

## 2024-06-02 LAB — BRAIN NATRIURETIC PEPTIDE: Pro B Natriuretic peptide (BNP): 43 pg/mL (ref 0.0–100.0)

## 2024-06-02 LAB — FOLATE: Folate: 10.6 ng/mL (ref >5.9)

## 2024-06-02 MED ORDER — AMLODIPINE BESYLATE 5 MG PO TABS: 5.0000 mg | ORAL_TABLET | Freq: Every day | ORAL | 1 refills | Status: AC

## 2024-06-02 MED ORDER — COVID-19 MRNA VAC-TRIS(PFIZER) 30 MCG/0.3ML IM SUSY: 0.3000 mL | PREFILLED_SYRINGE | Freq: Once | INTRAMUSCULAR | 0 refills | Status: AC

## 2024-06-02 MED ORDER — ROSUVASTATIN CALCIUM 20 MG PO TABS: 20.0000 mg | ORAL_TABLET | Freq: Every day | ORAL | 1 refills | Status: AC

## 2024-06-02 NOTE — Progress Notes (Unsigned)
 Subjective:  Patient ID: Melanie Garrett Ill, female    DOB: July 09, 1955  Age: 68 y.o. MRN: [id number]  CC: Annual Exam, Hypertension, Anemia, and Hyperlipidemia   HPI [name] presents for a CPX and f/up --  Discussed the use of AI scribe software for clinical note transcription with the patient, who gave verbal consent to proceed.  History of Present Illness Melanie Garrett is a 68 year old female who presents for an EKG and flu shot.  She feels well without headache, blurred vision, chest pain, or shortness of breath. No weakness, dizziness, or lightheadedness. She maintains an active lifestyle and feels good during physical activity without experiencing any chest pain or shortness of breath.  She was recently informed of anemia, which she was previously unaware of. She denies feeling anemic and reports no joint pain.  No stomach issues are reported.   Outpatient Medications Prior to Visit  Medication Sig Dispense Refill   RINVOQ  15 MG TB24 TAKE 1 TABLET BY MOUTH 1 TIME A DAY 30 tablet 0   VITAMIN D PO Take by mouth as needed.     leflunomide  (ARAVA ) 20 MG tablet TAKE 1 TABLET BY MOUTH EVERY DAY 90 tablet 0   benzonatate  (TESSALON  PERLES) 100 MG capsule Take 2 capsules (200 mg total) by mouth 3 (three) times daily as needed for cough. (Patient not taking: Reported on 05/13/2024) 20 capsule 0   conjugated estrogens  (PREMARIN ) vaginal cream Place vaginally daily. Place 0.5 g intravaginally once daily for two weeks, then reduce to twice per week. (Patient not taking: Reported on 05/13/2024) 29.96 g 2   Cyanocobalamin (VITAMIN B-12 PO) Take by mouth as needed. (Patient not taking: Reported on 05/13/2024)     folic acid  (FOLVITE ) 1 MG tablet Take 1 mg by mouth daily. (Patient not taking: Reported on 05/13/2024)     potassium chloride  SA (KLOR-CON ) 20 MEQ tablet Take 1 tablet (20 mEq total) by mouth daily. (Patient not taking: Reported on 05/13/2024) 90 tablet 0   traMADol   (ULTRAM ) 50 MG tablet Take 1 tablet (50 mg total) by mouth every 6 (six) hours as needed. (Patient not taking: Reported on 05/13/2024) 75 tablet 3   Ubrogepant  (UBRELVY ) 100 MG TABS Take 1 tablet by mouth daily as needed. (Patient not taking: Reported on 05/13/2024) 30 tablet 1   No facility-administered medications prior to visit.    ROS Review of Systems  Constitutional:  Negative for appetite change, chills, diaphoresis, fatigue and fever.  HENT: Negative.  Negative for trouble swallowing.   Eyes: Negative.   Respiratory:  Negative for cough, chest tightness, shortness of breath and wheezing.   Cardiovascular:  Negative for chest pain, palpitations and leg swelling.  Gastrointestinal: Negative.  Negative for abdominal pain, blood in stool, constipation, diarrhea, nausea and vomiting.  Genitourinary: Negative.  Negative for difficulty urinating and dysuria.  Musculoskeletal:  Negative for arthralgias, joint swelling and myalgias.  Skin: Negative.  Negative for color change and rash.  Neurological:  Negative for dizziness, weakness and light-headedness.  Hematological:  Negative for adenopathy. Does not bruise/bleed easily.  Psychiatric/Behavioral: Negative.  The patient is not hyperactive.     Objective:  BP (!) 148/82 (BP Location: Left Arm, Patient Position: Sitting, Cuff Size: Normal)   Pulse 71   Temp 97.9 F (36.6 C) (Oral)   Resp 16   Ht 5' 2 (1.575 m)   Wt 179 lb 12.8 oz (81.6 kg)   SpO2 98%   BMI 32.89 kg/m  BP Readings from Last 3 Encounters:  06/02/24 (!) 148/82  03/03/24 (!) 141/82  02/17/24 124/78    Wt Readings from Last 3 Encounters:  06/02/24 179 lb 12.8 oz (81.6 kg)  05/13/24 180 lb (81.6 kg)  03/03/24 180 lb (81.6 kg)    Physical Exam Vitals reviewed.  Constitutional:      Appearance: Normal appearance.  HENT:     Nose: Nose normal.     Mouth/Throat:     Mouth: Mucous membranes are moist.  Eyes:     General: No scleral icterus.     Conjunctiva/sclera: Conjunctivae normal.  Cardiovascular:     Rate and Rhythm: Normal rate and regular rhythm.     Heart sounds: Normal heart sounds, S1 normal and S2 normal.     No gallop.     Comments: EKG--- NSR, 68 bpm ST/T wave changes over the lateral leads No LVH No old EKG's to compare  Pulmonary:     Effort: Pulmonary effort is normal.     Breath sounds: No stridor. No wheezing, rhonchi or rales.  Abdominal:     General: Abdomen is flat.     Palpations: There is no mass.     Tenderness: There is no abdominal tenderness. There is no guarding.     Hernia: No hernia is present.  Musculoskeletal:     Cervical back: Neck supple.     Right lower leg: No edema.     Left lower leg: No edema.  Lymphadenopathy:     Cervical: No cervical adenopathy.  Skin:    General: Skin is warm and dry.     Coloration: Skin is not pale.  Neurological:     General: No focal deficit present.     Mental Status: She is alert. Mental status is at baseline.  Psychiatric:        Mood and Affect: Mood normal.        Behavior: Behavior normal.     Lab Results  Component Value Date   WBC 5.8 05/27/2024   HGB 11.2 (L) 05/27/2024   HCT 35.0 (L) 05/27/2024   PLT 290 05/27/2024   GLUCOSE 109 (H) 05/27/2024   CHOL 205 (H) 02/17/2024   TRIG 121 02/17/2024   HDL 48 (L) 02/17/2024   LDLCALC 134 (H) 02/17/2024   ALT 12 05/27/2024   AST 26 05/27/2024   NA 139 05/27/2024   K 3.8 05/27/2024   CL 105 05/27/2024   CREATININE 0.62 05/27/2024   BUN 12 05/27/2024   CO2 26 05/27/2024   TSH 1.54 06/02/2024   HGBA1C 5.9 08/16/2020    MR BRAIN/IAC W WO CONTRAST Result Date: 01/06/2024 CLINICAL DATA:  Right asymmetric hearing loss EXAM: MRI HEAD WITHOUT AND WITH CONTRAST TECHNIQUE: Multiplanar, multiecho pulse sequences of the brain and surrounding structures were obtained without and with intravenous contrast. CONTRAST:  8mL GADAVIST  GADOBUTROL  1 MMOL/ML IV SOLN COMPARISON:  None Available. FINDINGS:  The internal auditory canals are normal. No tumor identified. The vestibular and cochlear structures are normal and symmetric. There is no fluid or abnormal tissue seen in the middle ear on either side. There are a few small foci of T2 hyperintensity in the cerebral white matter. These do not have restricted diffusion or enhancement. IMPRESSION: No tumor or other cause for hearing loss identified No significant abnormality. Electronically Signed   By: Nancyann Burns M.D.   On: 01/06/2024 11:52    The 10-year ASCVD risk score (Arnett DK, et al., 2019) is: 14.4%  Values used to calculate the score:     Age: 7 years     Clinically relevant sex: Female     Is Non-Hispanic African American: Yes     Diabetic: No     Tobacco smoker: No     Systolic Blood Pressure: 148 mmHg     Is BP treated: Yes     HDL Cholesterol: 48 mg/dL     Total Cholesterol: 205 mg/dL   Estimated Creatinine Clearance: 67.5 mL/min (by C-G formula based on SCr of 0.62 mg/dL).   Assessment & Plan:  Need for immunization against influenza -     Flu vaccine HIGH DOSE PF(Fluzone Trivalent)  Screen for colon cancer -     Ambulatory referral to Gastroenterology  Primary hypertension- She has not achieve her BP goal. EKG is negative for LVH. Will start amlodipine. -     TSH; Future -     EKG 12-Lead -     amLODIPine Besylate; Take 1 tablet (5 mg total) by mouth daily.  Dispense: 90 tablet; Refill: 1 -     COVID-19 mRNA Vac-TriS(Pfizer); Inject 0.3 mLs into the muscle once for 1 dose.  Dispense: 0.3 mL; Refill: 0 -     AMB Referral VBCI Care Management  Hyperlipidemia with target LDL less than 70- Will start a statin for CV risk reduction. -     Rosuvastatin Calcium; Take 1 tablet (20 mg total) by mouth daily.  Dispense: 90 tablet; Refill: 1 -     AMB Referral VBCI Care Management  Deficiency anemia- Will evaluate for vitamin deficiencies. -     IBC + Ferritin; Future -     Reticulocytes; Future -     Vitamin B1; Future -      Zinc; Future -     Vitamin B12; Future -     Folate; Future  Abnormal electrocardiogram (ECG) (EKG)- Will evaluate with an ECHO. -     Brain natriuretic peptide; Future -     Troponin I (High Sensitivity); Future -     ECHOCARDIOGRAM COMPLETE; Future  Encounter for general adult medical examination with abnormal findings- Exam completed, labs reviewed, vaccines reviewed and updated, cancer screenings addressed, pt ed material was given.      Follow-up: Return in about 6 months (around 12/01/2024).  Debby Molt, MD

## 2024-06-02 NOTE — Patient Instructions (Signed)

## 2024-06-03 ENCOUNTER — Other Ambulatory Visit: Payer: Self-pay | Admitting: Physician Assistant

## 2024-06-03 NOTE — Telephone Encounter (Signed)
 Last Fill: 03/08/2024  Labs: 05/27/2024 CBC and CMP are stable.  Next Visit: 07/22/2024  Last Visit: 02/17/2024  DX: Seropositive rheumatoid arthritis (HCC)   Current Dose per office note 02/17/2024: arava  20 mg 1 tablet daily.   Okay to refill Arava  ?

## 2024-06-04 DIAGNOSIS — Z0001 Encounter for general adult medical examination with abnormal findings: Secondary | ICD-10-CM | POA: Insufficient documentation

## 2024-06-06 LAB — VITAMIN B1: Vitamin B1 (Thiamine): 15 nmol/L (ref 8–30)

## 2024-06-07 ENCOUNTER — Telehealth: Payer: Self-pay | Admitting: *Deleted

## 2024-06-07 ENCOUNTER — Other Ambulatory Visit: Payer: Self-pay | Admitting: Internal Medicine

## 2024-06-07 ENCOUNTER — Ambulatory Visit: Payer: Self-pay | Admitting: Internal Medicine

## 2024-06-07 DIAGNOSIS — D538 Other specified nutritional anemias: Secondary | ICD-10-CM | POA: Insufficient documentation

## 2024-06-07 LAB — RETICULOCYTES
ABS Retic: 52080 {cells}/uL (ref 20000–80000)
Retic Ct Pct: 1.4 %

## 2024-06-07 LAB — ZINC: Zinc: 32 ug/dL — ABNORMAL LOW (ref 60–130)

## 2024-06-07 MED ORDER — ZINC GLUCONATE 50 MG PO TABS: 50.0000 mg | ORAL_TABLET | Freq: Every day | ORAL | 0 refills | Status: AC

## 2024-06-07 NOTE — Progress Notes (Signed)
 Care Guide Pharmacy Note  06/07/2024 Name: Melanie Garrett MRN: [id number] DOB: 08/20/1955  Referred By: Joshua Debby CROME, MD Reason for referral: Call Attempt #1 and Complex Care Management (Outreach to schedule referral with pharmacist )   Melanie Garrett is a 68 y.o. year old female who is a primary care patient of Joshua Debby CROME, MD.  [name] Ill was referred to the pharmacist for assistance related to: HTN  Successful contact was made with the patient to discuss pharmacy services including being ready for the pharmacist to call at least 5 minutes before the scheduled appointment time and to have medication bottles and any blood pressure readings ready for review. The patient agreed to meet with the pharmacist via telephone visit on 06/14/2024  Thedford Franks, CMA [place]  [facility] Care Guide Direct Dial: [phone]  Fax: [phone] Website: [email or web address]

## 2024-06-08 LAB — COLOGUARD: COLOGUARD: NEGATIVE

## 2024-06-14 ENCOUNTER — Other Ambulatory Visit

## 2024-06-14 DIAGNOSIS — I10 Essential (primary) hypertension: Secondary | ICD-10-CM

## 2024-06-14 DIAGNOSIS — E785 Hyperlipidemia, unspecified: Secondary | ICD-10-CM

## 2024-06-14 NOTE — Progress Notes (Signed)
 "  06/14/2024 Name: Melanie Garrett MRN: [id number] DOB: Jun 11, 1956  Chief Complaint  Patient presents with   Hypertension   Hyperlipidemia   Medication Management    LAMAR NAEF is a 68 y.o. year old female who presented for a telephone visit.   They were referred to the pharmacist by their PCP for assistance in managing hypertension and hyperlipidemia/cardiovascular risk reduction.    Subjective:  Care Team: Primary Care Provider: Joshua Debby CROME, MD ; Next Scheduled Visit: none scheduled  Medication Access/Adherence  Current Pharmacy:  [id number] - [place] - [address] [place] [address] Phone: [phone] Fax: [phone]  [facility] [place], [place] - [address] [place] [address] Phone: [phone] Fax: [phone]   Patient reports affordability concerns with their medications: No  Patient reports access/transportation concerns to their pharmacy: No  Patient reports adherence concerns with their medications:  No     Hypertension:  Current medications: Amlodipine  5 mg daily Medications previously tried: none  Patient has a validated, automated, upper arm home BP cuff Current blood pressure readings readings: none recent   Hyperlipidemia/ASCVD Risk Reduction  Current lipid lowering medications: rosuvastatin  20 mg daily Medications tried in the past: none  Antiplatelet regimen: none  The 10-year ASCVD risk score (Arnett DK, et al., 2019) is: 14.8%   Values used to calculate the score:     Age: 34 years     Clinically relevant sex: Female     Is Non-Hispanic African American: Yes     Diabetic: No     Tobacco smoker: No     Systolic Blood Pressure: 148 mmHg     Is BP treated: Yes     HDL Cholesterol: 48 mg/dL     Total Cholesterol: 205 mg/dL    Objective:  Lab Results  Component Value Date   HGBA1C 5.9 08/16/2020    Lab Results   Component Value Date   CREATININE 0.62 05/27/2024   BUN 12 05/27/2024   NA 139 05/27/2024   K 3.8 05/27/2024   CL 105 05/27/2024   CO2 26 05/27/2024    Lab Results  Component Value Date   CHOL 205 (H) 02/17/2024   HDL 48 (L) 02/17/2024   LDLCALC 134 (H) 02/17/2024   TRIG 121 02/17/2024   CHOLHDL 4.3 02/17/2024    Medications Reviewed Today     Reviewed by Merceda Lela SAUNDERS, RPH-CPP (Pharmacist) on 06/14/24 at 1619  Med List Status: <None>   Medication Order Taking? Sig Documenting Provider Last Dose Status Informant  amLODipine  (NORVASC ) 5 MG tablet [id number] Yes Take 1 tablet (5 mg total) by mouth daily. Joshua Debby CROME, MD  Active   leflunomide  (ARAVA ) 20 MG tablet [id number] Yes TAKE 1 TABLET BY MOUTH EVERY DAY Cheryl Waddell CHRISTELLA DEVONNA  Active   RINVOQ  15 MG TB24 [id number] Yes TAKE 1 TABLET BY MOUTH 1 TIME A DAY Cheryl Waddell CHRISTELLA, PA-C  Active   rosuvastatin  (CRESTOR ) 20 MG tablet [id number] Yes Take 1 tablet (20 mg total) by mouth daily. Joshua Debby CROME, MD  Active   VITAMIN D PO [id number]  Take by mouth as needed. [provider]  Active   zinc  gluconate 50 MG tablet [id number]  Take 1 tablet (50 mg total) by mouth daily.  Patient not taking: Reported on 06/14/2024   Joshua Debby CROME, MD  Active  Assessment/Plan:   Hypertension: - Currently uncontrolled, BP goal <130/80.  - Reviewed long term cardiovascular and renal outcomes of uncontrolled blood pressure - Reviewed appropriate blood pressure monitoring technique and reviewed goal blood pressure. Recommended to check home blood pressure and heart rate  - Recommend to continue amlodipine , monitor BP at home and contact us  if remaining above goal      Hyperlipidemia/ASCVD Risk Reduction: - Currently uncontrolled. LDL goal <100 - Continue rosuvastatin    Follow Up Plan: PRN  Darrelyn Drum, PharmD, BCPS, CPP Clinical Pharmacist Practitioner [facility] [phone]    "

## 2024-06-14 NOTE — Patient Instructions (Signed)
 It was a pleasure speaking with you today!  Continue current regimen. Monitor BP at home and contact us  is still above 130/80 at home with new medication.  Feel free to call with any questions or concerns!  Darrelyn Drum, PharmD, BCPS, CPP Clinical Pharmacist Practitioner [facility] [phone]

## 2024-06-28 ENCOUNTER — Other Ambulatory Visit: Payer: Self-pay | Admitting: Physician Assistant

## 2024-06-28 DIAGNOSIS — Z79899 Other long term (current) drug therapy: Secondary | ICD-10-CM

## 2024-06-28 DIAGNOSIS — M059 Rheumatoid arthritis with rheumatoid factor, unspecified: Secondary | ICD-10-CM

## 2024-06-29 NOTE — Telephone Encounter (Signed)
 Last Fill: 05/25/2024 (30 day supply)  Labs: 05/27/2024 CBC and CMP are stable.   TB Gold: 05/27/2024 negative    Next Visit: 07/22/2024  Last Visit: 02/17/2024  IK:Dzmnendpupcz rheumatoid arthritis   Current Dose per office note on 02/17/2024: Rinvoq  15 mg 1 tablet by mouth daily   Okay to refill Rinvoq ?

## 2024-07-22 ENCOUNTER — Ambulatory Visit: Admitting: Rheumatology

## 2024-07-26 ENCOUNTER — Encounter: Payer: Self-pay | Admitting: Gastroenterology

## 2024-07-27 ENCOUNTER — Other Ambulatory Visit: Payer: Self-pay

## 2024-08-03 ENCOUNTER — Ambulatory Visit (HOSPITAL_COMMUNITY)

## 2024-08-24 ENCOUNTER — Ambulatory Visit: Admitting: Rheumatology
